# Patient Record
Sex: Female | Born: 1970
Health system: Southern US, Community
[De-identification: ages and names within clinical notes are randomized; demographics above are authoritative.]

## PROBLEM LIST (undated history)

## (undated) DIAGNOSIS — Z969 Presence of functional implant, unspecified: Secondary | ICD-10-CM

## (undated) DIAGNOSIS — S60811A Abrasion of right wrist, initial encounter: Secondary | ICD-10-CM

## (undated) DIAGNOSIS — J3489 Other specified disorders of nose and nasal sinuses: Secondary | ICD-10-CM

## (undated) DIAGNOSIS — N951 Menopausal and female climacteric states: Secondary | ICD-10-CM

## (undated) DIAGNOSIS — I10 Essential (primary) hypertension: Secondary | ICD-10-CM

## (undated) DIAGNOSIS — M1990 Unspecified osteoarthritis, unspecified site: Secondary | ICD-10-CM

## (undated) DIAGNOSIS — E039 Hypothyroidism, unspecified: Secondary | ICD-10-CM

## (undated) DIAGNOSIS — Z98811 Dental restoration status: Secondary | ICD-10-CM

## (undated) DIAGNOSIS — K219 Gastro-esophageal reflux disease without esophagitis: Secondary | ICD-10-CM

## (undated) DIAGNOSIS — R05 Cough: Secondary | ICD-10-CM

## (undated) HISTORY — PX: BREAST BIOPSY: SHX20

## (undated) HISTORY — DX: Gastro-esophageal reflux disease without esophagitis: K21.9

## (undated) HISTORY — PX: MULTIPLE TOOTH EXTRACTIONS: SHX2053

## (undated) HISTORY — DX: Morbid (severe) obesity due to excess calories: E66.01

---
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
## 2003-03-29 ENCOUNTER — Encounter: Payer: Self-pay | Admitting: Family Medicine

## 2004-02-01 ENCOUNTER — Encounter: Payer: Self-pay | Admitting: Family Medicine

## 2005-02-10 ENCOUNTER — Encounter: Payer: Self-pay | Admitting: Family Medicine

## 2005-02-12 ENCOUNTER — Encounter: Payer: Self-pay | Admitting: Family Medicine

## 2007-02-24 LAB — CONVERTED CEMR LAB: Pap Smear: NORMAL

## 2008-02-01 ENCOUNTER — Ambulatory Visit: Payer: Self-pay | Admitting: Family Medicine

## 2008-02-01 DIAGNOSIS — F3340 Major depressive disorder, recurrent, in remission, unspecified: Secondary | ICD-10-CM | POA: Insufficient documentation

## 2008-02-01 DIAGNOSIS — F325 Major depressive disorder, single episode, in full remission: Secondary | ICD-10-CM | POA: Insufficient documentation

## 2008-02-01 DIAGNOSIS — S93409A Sprain of unspecified ligament of unspecified ankle, initial encounter: Secondary | ICD-10-CM | POA: Insufficient documentation

## 2008-02-01 DIAGNOSIS — K219 Gastro-esophageal reflux disease without esophagitis: Secondary | ICD-10-CM | POA: Insufficient documentation

## 2008-02-01 DIAGNOSIS — J309 Allergic rhinitis, unspecified: Secondary | ICD-10-CM | POA: Insufficient documentation

## 2008-02-01 DIAGNOSIS — N6019 Diffuse cystic mastopathy of unspecified breast: Secondary | ICD-10-CM | POA: Insufficient documentation

## 2008-02-01 DIAGNOSIS — G43809 Other migraine, not intractable, without status migrainosus: Secondary | ICD-10-CM | POA: Insufficient documentation

## 2008-02-01 DIAGNOSIS — F331 Major depressive disorder, recurrent, moderate: Secondary | ICD-10-CM | POA: Insufficient documentation

## 2008-02-02 ENCOUNTER — Telehealth (INDEPENDENT_AMBULATORY_CARE_PROVIDER_SITE_OTHER): Payer: Self-pay | Admitting: *Deleted

## 2008-02-29 ENCOUNTER — Ambulatory Visit: Payer: Self-pay | Admitting: Family Medicine

## 2008-02-29 ENCOUNTER — Encounter: Payer: Self-pay | Admitting: Family Medicine

## 2008-02-29 ENCOUNTER — Other Ambulatory Visit: Admission: RE | Admit: 2008-02-29 | Discharge: 2008-02-29 | Payer: Self-pay | Admitting: Family Medicine

## 2008-02-29 LAB — HM PAP SMEAR

## 2008-03-02 ENCOUNTER — Encounter: Payer: Self-pay | Admitting: Family Medicine

## 2008-10-24 ENCOUNTER — Ambulatory Visit (HOSPITAL_COMMUNITY): Admission: RE | Admit: 2008-10-24 | Discharge: 2008-10-24 | Payer: Self-pay | Admitting: Obstetrics and Gynecology

## 2008-11-15 ENCOUNTER — Encounter (HOSPITAL_COMMUNITY): Payer: Self-pay | Admitting: Obstetrics and Gynecology

## 2008-11-15 ENCOUNTER — Ambulatory Visit (HOSPITAL_COMMUNITY): Admission: RE | Admit: 2008-11-15 | Discharge: 2008-11-15 | Payer: Self-pay | Admitting: Internal Medicine

## 2008-11-15 HISTORY — PX: DILATION AND EVACUATION: SHX1459

## 2009-09-13 ENCOUNTER — Ambulatory Visit (HOSPITAL_COMMUNITY): Admission: RE | Admit: 2009-09-13 | Discharge: 2009-09-13 | Payer: Self-pay | Admitting: Gynecology

## 2009-10-24 ENCOUNTER — Ambulatory Visit (HOSPITAL_COMMUNITY): Admission: RE | Admit: 2009-10-24 | Discharge: 2009-10-24 | Payer: Self-pay | Admitting: Obstetrics and Gynecology

## 2009-10-24 HISTORY — PX: HYSTEROSCOPY WITH D & C: SHX1775

## 2010-03-04 ENCOUNTER — Telehealth: Payer: Self-pay | Admitting: Family Medicine

## 2010-03-10 ENCOUNTER — Emergency Department (HOSPITAL_COMMUNITY)
Admission: EM | Admit: 2010-03-10 | Discharge: 2010-03-10 | Payer: Self-pay | Source: Home / Self Care | Admitting: Emergency Medicine

## 2010-03-27 NOTE — Progress Notes (Signed)
Summary: alprazolam   Phone Note Refill Request Call back at [phone] Message from:  Patient on March 04, 2010 9:01 AM  Refills Requested: Medication #1:  ALPRAZOLAM 0.5 MG TABS 1 tab by mouth x 1 15 minutes prior to flight Patient is leaving tomorrow for a vacation. She is scared of flyling and is asking if she could get a refill for this to help her on her flight. Uses [facility] outpatient pharmacy.   Initial call taken by: Melody Comas,  March 04, 2010 9:02 AM  Follow-up for Phone Call        Rx called to pharmacy Follow-up by: Benny Lennert CMA Duncan Dull),  March 04, 2010 9:25 AM    Prescriptions: ALPRAZOLAM 0.5 MG TABS (ALPRAZOLAM) 1 tab by mouth x 1 15 minutes prior to flight, may repeat x 1 if anxiety continues  #4 x 0   Entered and Authorized by:   Kerby Nora MD   Signed by:   Kerby Nora MD on 03/04/2010   Method used:   Telephoned to ...       [facility]* (retail)       [address]       [address] Shipping/mailing       [place], [place]  [address]       Ph: [phone]       Fax: [phone]   RxID:   [id number]

## 2010-04-02 ENCOUNTER — Encounter: Payer: Self-pay | Admitting: Family Medicine

## 2010-05-08 LAB — CBC
HCT: 37.2 % (ref 36.0–46.0)
Hemoglobin: 12.6 g/dL (ref 12.0–15.0)
MCH: 30.6 pg (ref 26.0–34.0)
MCHC: 33.8 g/dL (ref 30.0–36.0)
MCV: 90.4 fL (ref 78.0–100.0)
Platelets: 357 10*3/uL (ref 150–400)
WBC: 12.8 10*3/uL — ABNORMAL HIGH (ref 4.0–10.5)

## 2010-05-08 LAB — PREGNANCY, URINE: Preg Test, Ur: NEGATIVE

## 2010-05-15 ENCOUNTER — Telehealth: Payer: Self-pay | Admitting: *Deleted

## 2010-05-15 NOTE — Telephone Encounter (Signed)
Patient is coming in for labs tomorrow and is asking if she could have her thyroid checked. She says that she was recently diagnosed with hypothyroidism by her OBGYN. Please advise.

## 2010-05-15 NOTE — Telephone Encounter (Signed)
Sent to terri to add for tomorrow

## 2010-05-15 NOTE — Telephone Encounter (Signed)
Yes ...add TSH, free T3 and free T4 Dx 244.9

## 2010-05-15 NOTE — Telephone Encounter (Signed)
CAN U ADD THESE

## 2010-05-16 ENCOUNTER — Other Ambulatory Visit (INDEPENDENT_AMBULATORY_CARE_PROVIDER_SITE_OTHER): Payer: 59 | Admitting: Family Medicine

## 2010-05-16 DIAGNOSIS — Z0000 Encounter for general adult medical examination without abnormal findings: Secondary | ICD-10-CM

## 2010-05-16 LAB — TSH: TSH: 0.95 u[IU]/mL (ref 0.35–5.50)

## 2010-05-16 LAB — BASIC METABOLIC PANEL
CO2: 28 mEq/L (ref 19–32)
Calcium: 9.1 mg/dL (ref 8.4–10.5)
Glucose, Bld: 86 mg/dL (ref 70–99)
Potassium: 4.2 mEq/L (ref 3.5–5.1)
Sodium: 141 mEq/L (ref 135–145)

## 2010-05-16 LAB — HEPATIC FUNCTION PANEL
AST: 19 U/L (ref 0–37)
Albumin: 3.6 g/dL (ref 3.5–5.2)
Bilirubin, Direct: 0.1 mg/dL (ref 0.0–0.3)
Total Bilirubin: 0.8 mg/dL (ref 0.3–1.2)
Total Protein: 6.4 g/dL (ref 6.0–8.3)

## 2010-05-16 LAB — CBC WITH DIFFERENTIAL/PLATELET
Basophils Absolute: 0 10*3/uL (ref 0.0–0.1)
Eosinophils Absolute: 0 10*3/uL (ref 0.0–0.7)
Eosinophils Relative: 0 % (ref 0.0–5.0)
Lymphocytes Relative: 27.4 % (ref 12.0–46.0)
Lymphs Abs: 2.5 10*3/uL (ref 0.7–4.0)
Monocytes Relative: 5.3 % (ref 3.0–12.0)
Platelets: 306 10*3/uL (ref 150.0–400.0)
RDW: 14 % (ref 11.5–14.6)

## 2010-05-16 LAB — T4, FREE: Free T4: 0.82 ng/dL (ref 0.60–1.60)

## 2010-05-16 LAB — T3, FREE: T3, Free: 2.7 pg/mL (ref 2.3–4.2)

## 2010-05-20 ENCOUNTER — Ambulatory Visit (INDEPENDENT_AMBULATORY_CARE_PROVIDER_SITE_OTHER): Payer: 59 | Admitting: Family Medicine

## 2010-05-20 ENCOUNTER — Encounter: Payer: Self-pay | Admitting: Family Medicine

## 2010-05-20 ENCOUNTER — Other Ambulatory Visit (HOSPITAL_COMMUNITY)
Admission: RE | Admit: 2010-05-20 | Discharge: 2010-05-20 | Disposition: A | Discharge status: Home / Self Care | Payer: 59 | Source: Ambulatory Visit | Attending: Family Medicine | Admitting: Family Medicine

## 2010-05-20 DIAGNOSIS — Z1231 Encounter for screening mammogram for malignant neoplasm of breast: Secondary | ICD-10-CM

## 2010-05-20 DIAGNOSIS — E039 Hypothyroidism, unspecified: Secondary | ICD-10-CM | POA: Insufficient documentation

## 2010-05-20 DIAGNOSIS — Z0000 Encounter for general adult medical examination without abnormal findings: Secondary | ICD-10-CM

## 2010-05-20 DIAGNOSIS — Z01419 Encounter for gynecological examination (general) (routine) without abnormal findings: Secondary | ICD-10-CM | POA: Insufficient documentation

## 2010-05-20 DIAGNOSIS — Z1239 Encounter for other screening for malignant neoplasm of breast: Secondary | ICD-10-CM

## 2010-05-20 NOTE — Progress Notes (Signed)
Subjective:    Patient ID: Jamie Mckenzie, female    DOB: 03-05-1970, 40 y.o.   MRN: [id number]  HPI  The patient is here for annual wellness exam and preventative care.   Updates in last 2 years since seen last.   10/2008... Pregnancy and misscarriage.  Diagnosed with septated uterus. 10/2009 menorrhagia: uterine polyp removed.   Has gained a significant weight in last year... A lot of stress in past few years.  Minimal exercise... Poor diet. Grazes a lot. 2 big meals a day.  Dx with hypothyroid: started on levothyroxine in 2010.  Lab Results  Component Value Date   TSH 0.95 05/16/2010      Review of Systems  Constitutional: Negative for fever, fatigue and unexpected weight change.  HENT: Negative for ear pain, congestion, sore throat, sneezing, trouble swallowing, neck pain and sinus pressure.   Eyes: Negative for pain and itching.  Respiratory: Negative for cough, shortness of breath and wheezing.   Cardiovascular: Negative for chest pain, palpitations and leg swelling.  Gastrointestinal: Negative for nausea, abdominal pain, diarrhea, constipation and blood in stool.  Genitourinary: Negative for dysuria, hematuria, vaginal discharge, difficulty urinating and menstrual problem.  Skin: Negative for rash.  Neurological: Negative for syncope, weakness, light-headedness, numbness and headaches.  Psychiatric/Behavioral: Negative for confusion and dysphoric mood. The patient is not nervous/anxious.         Objective:   Physical Exam  Constitutional: Vital signs are normal. She appears well-developed and well-nourished. She is cooperative.  Non-toxic appearance. She does not appear ill. No distress.  HENT:  Head: Normocephalic.  Right Ear: Hearing, tympanic membrane, external ear and ear canal normal.  Left Ear: Hearing, tympanic membrane, external ear and ear canal normal.  Nose: Nose normal.  Eyes: Conjunctivae, EOM and lids are normal. Pupils are equal, round, and  reactive to light. No foreign bodies found.  Neck: Trachea normal and normal range of motion. Neck supple. Carotid bruit is not present. No mass and no thyromegaly present.  Cardiovascular: Normal rate, regular rhythm, S1 normal, S2 normal, normal heart sounds and intact distal pulses.  Exam reveals no gallop.   No murmur heard. Pulmonary/Chest: Effort normal and breath sounds normal. No respiratory distress. She has no wheezes. She has no rhonchi. She has no rales.  Abdominal: Soft. Normal appearance and bowel sounds are normal. She exhibits no distension, no fluid wave, no abdominal bruit and no mass. There is no hepatosplenomegaly. There is no tenderness. There is no rebound, no guarding and no CVA tenderness. No hernia.  Genitourinary: Rectum normal, vagina normal and uterus normal. No breast swelling, tenderness, discharge or bleeding. Pelvic exam was performed with patient prone. There is no rash, tenderness or lesion on the right labia. There is no rash, tenderness or lesion on the left labia. Uterus is not enlarged and not tender. Cervix exhibits no discharge and no friability. Right adnexum displays no mass, no tenderness and no fullness. Left adnexum displays no mass, no tenderness and no fullness.  Lymphadenopathy:    She has no cervical adenopathy.    She has no axillary adenopathy.  Neurological: She is alert. She has normal strength. No cranial nerve deficit or sensory deficit.  Skin: Skin is warm, dry and intact. No rash noted.  Psychiatric: Her speech is normal and behavior is normal. Judgment normal. Her mood appears not anxious. Cognition and memory are normal. She does not exhibit a depressed mood.          Assessment &  Plan:  Complete Physical Exam: The patient's preventative maintenance and recommended screening tests for an annual wellness exam were reviewed in full today. Brought up to date unless services declined.  Counselled on the importance of diet, exercise, and  its role in overall health and mortality. The patient's FH and SH was reviewed, including their home life, tobacco status, and drug and alcohol status.   Pap pending.  Schedule Mammogram.

## 2010-05-20 NOTE — Assessment & Plan Note (Signed)
Well controlled. Continue current medication.  

## 2010-05-20 NOTE — Patient Instructions (Addendum)
Stop by front desk to see Jamie Mckenzie about mammogram referral.  Work on healthy eating, portion size and regular exercise to lose weight.  Stop smoking .Marland Kitchen Call if interested in medication to quit.

## 2010-05-23 ENCOUNTER — Encounter: Payer: Self-pay | Admitting: *Deleted

## 2010-05-27 ENCOUNTER — Encounter: Payer: Self-pay | Admitting: *Deleted

## 2010-05-28 ENCOUNTER — Ambulatory Visit (HOSPITAL_COMMUNITY)
Admission: RE | Admit: 2010-05-28 | Discharge: 2010-05-28 | Disposition: A | Discharge status: Home / Self Care | Payer: 59 | Source: Ambulatory Visit | Attending: Family Medicine | Admitting: Family Medicine

## 2010-05-28 DIAGNOSIS — Z1231 Encounter for screening mammogram for malignant neoplasm of breast: Secondary | ICD-10-CM | POA: Insufficient documentation

## 2010-05-29 ENCOUNTER — Encounter: Payer: Self-pay | Admitting: *Deleted

## 2010-05-30 LAB — CBC
HCT: 40.6 % (ref 36.0–46.0)
Hemoglobin: 13.9 g/dL (ref 12.0–15.0)
RBC: 4.44 MIL/uL (ref 3.87–5.11)

## 2010-12-12 ENCOUNTER — Other Ambulatory Visit: Payer: Self-pay | Admitting: *Deleted

## 2010-12-12 MED ORDER — NORETHINDRONE 0.35 MG PO TABS: 1.0000 | ORAL_TABLET | Freq: Every day | ORAL | Status: DC

## 2010-12-12 MED ORDER — LEVOTHYROXINE SODIUM 25 MCG PO TABS: 25.0000 ug | ORAL_TABLET | Freq: Every day | ORAL | Status: DC

## 2011-04-21 ENCOUNTER — Telehealth: Payer: Self-pay | Admitting: *Deleted

## 2011-04-21 NOTE — Telephone Encounter (Signed)
Received a call from [facility] with Call A Nurse stating that patient has been experiencing food being caught in her throat over the past several days.  Bonita Quin stated that per call a nurse guidelines patient should be evaluated today.  Annabell Sabal to have patient go to the ER.  I called patient and advised her to go to ER.  She agreed.

## 2011-04-21 NOTE — Telephone Encounter (Signed)
Triage Record Num: [id number] Operator: Di Kindle Patient Name: Jamie Mckenzie Call Date & Time: 04/21/2011 2:24:18PM Patient Phone: [phone] PCP: Kerby Nora Patient Gender: Female PCP Fax : [phone] Patient DOB: 01-23-1971 Practice Name: [facility] Reason for Call: Caller: [name]/Patient; PCP: Excell Seltzer.; CB#: [phone]; Call regarding Indigestion; occurs frequently after eating over the last 2-3 weeks, occurs now with discomfort in her chest, like something is stuck. Symptoms reviewed Swallowing Difficulty, states has improved on its own before today. With symptoms, ED disposition, call to office nurse: Lowella Bandy who advises RN will call pt back after talking with MD. Protocol(s) Used: Heartburn Protocol(s) Used: Swallowing Difficulty Recommended Outcome per Protocol: See ED Immediately Reason for Outcome: Food or foreign body feels stuck in esophagus. Something seems to be stuck AND not choking Care Advice: ~ Another adult should drive. Call EMS 911 if sudden onset or sudden worsening of breathing problems, struggling to breathe, high pitched noise when breathing in (stridor), unable to speak, grasping at throat, or panic/anxiety because of breathing problems. ~ ~ Sit upright or raise head with pillows. 04/21/2011 3:16:43PM Page 1 of 1 [id number]

## 2011-05-14 NOTE — Telephone Encounter (Signed)
Reviewed

## 2011-06-15 ENCOUNTER — Other Ambulatory Visit: Payer: Self-pay | Admitting: Family Medicine

## 2011-06-15 DIAGNOSIS — Z1231 Encounter for screening mammogram for malignant neoplasm of breast: Secondary | ICD-10-CM

## 2011-07-08 ENCOUNTER — Ambulatory Visit (HOSPITAL_COMMUNITY): Payer: 59

## 2011-09-15 ENCOUNTER — Ambulatory Visit (HOSPITAL_COMMUNITY)
Admission: RE | Admit: 2011-09-15 | Discharge: 2011-09-15 | Disposition: A | Discharge status: Home / Self Care | Payer: 59 | Source: Ambulatory Visit | Attending: Family Medicine | Admitting: Family Medicine

## 2011-09-15 DIAGNOSIS — Z1231 Encounter for screening mammogram for malignant neoplasm of breast: Secondary | ICD-10-CM | POA: Insufficient documentation

## 2011-10-24 ENCOUNTER — Emergency Department (HOSPITAL_COMMUNITY)
Admission: EM | Admit: 2011-10-24 | Discharge: 2011-10-24 | Disposition: A | Discharge status: Home / Self Care | Payer: 59 | Attending: Emergency Medicine | Admitting: Emergency Medicine

## 2011-10-24 ENCOUNTER — Encounter (HOSPITAL_COMMUNITY): Payer: Self-pay | Admitting: Cardiology

## 2011-10-24 ENCOUNTER — Emergency Department (HOSPITAL_COMMUNITY): Payer: 59

## 2011-10-24 DIAGNOSIS — K219 Gastro-esophageal reflux disease without esophagitis: Secondary | ICD-10-CM | POA: Insufficient documentation

## 2011-10-24 DIAGNOSIS — S82839A Other fracture of upper and lower end of unspecified fibula, initial encounter for closed fracture: Secondary | ICD-10-CM

## 2011-10-24 DIAGNOSIS — X500XXA Overexertion from strenuous movement or load, initial encounter: Secondary | ICD-10-CM | POA: Insufficient documentation

## 2011-10-24 DIAGNOSIS — S82899A Other fracture of unspecified lower leg, initial encounter for closed fracture: Secondary | ICD-10-CM | POA: Insufficient documentation

## 2011-10-24 DIAGNOSIS — Y9229 Other specified public building as the place of occurrence of the external cause: Secondary | ICD-10-CM | POA: Insufficient documentation

## 2011-10-24 MED ORDER — MORPHINE SULFATE 4 MG/ML IJ SOLN
4.0000 mg | Freq: Once | INTRAMUSCULAR | Status: AC
  Administered 2011-10-24: 4 mg via INTRAVENOUS
  Filled 2011-10-24: qty 1

## 2011-10-24 MED ORDER — IBUPROFEN 800 MG PO TABS
800.0000 mg | ORAL_TABLET | Freq: Once | ORAL | Status: AC
  Administered 2011-10-24: 800 mg via ORAL
  Filled 2011-10-24: qty 1

## 2011-10-24 MED ORDER — OXYCODONE-ACETAMINOPHEN 5-325 MG PO TABS: 1.0000 | ORAL_TABLET | Freq: Four times a day (QID) | ORAL | Status: DC | PRN

## 2011-10-24 MED ORDER — ONDANSETRON HCL 4 MG/2ML IJ SOLN
4.0000 mg | Freq: Once | INTRAMUSCULAR | Status: AC
  Administered 2011-10-24: 4 mg via INTRAVENOUS
  Filled 2011-10-24: qty 2

## 2011-10-24 NOTE — ED Notes (Signed)
Pt waiting for ortho to place split to left foot.

## 2011-10-24 NOTE — ED Provider Notes (Signed)
History     CSN: [id number]  Arrival date & time 10/24/11  1021   First MD Initiated Contact with Patient 10/24/11 1022      No chief complaint on file.   (Consider location/radiation/quality/duration/timing/severity/associated sxs/prior treatment) Patient is a 41 y.o. female presenting with ankle pain. The history is provided by the patient.  Ankle Pain  The incident occurred less than 1 hour ago. Incident location: at bed, bath, and beyond. Injury mechanism: stepped off curb and rolled ankle. The pain is present in the left ankle. The quality of the pain is described as sharp. The pain is severe. The pain has been constant since onset. Associated symptoms include inability to bear weight. Pertinent negatives include no numbness and no tingling. She reports no foreign bodies present. The symptoms are aggravated by activity, bearing weight and palpation. She has tried nothing for the symptoms.    Past Medical History  Diagnosis Date  . Allergy   . GERD (gastroesophageal reflux disease)   . Depression   . Ocular migraine   . Complete miscarriage 2010  . Uterine polyp 2010    Past Surgical History  Procedure Date  . Ankle fracture repair 1985    Hardware in fibula  . Dilation and curettage of uterus 2010, 2011    Family History  Problem Relation Age of Onset  . Hypertension Mother   . Arthritis Mother   . Obesity Mother     morbid  . Myasthenia gravis Mother   . Cancer Father     non hodgkins lymphoma  . Cancer Maternal Grandmother     lung  . Heart disease Maternal Grandfather     heart attack  . COPD Paternal Grandfather     History  Substance Use Topics  . Smoking status: Current Some Day Smoker -- 2 years    Types: Cigarettes  . Smokeless tobacco: Not on file  . Alcohol Use: Yes    OB History    Grav Para Term Preterm Abortions TAB SAB Ect Mult Living                  Review of Systems  Neurological: Negative for tingling and numbness.  All other  systems reviewed and are negative.    Allergies  Review of patient's allergies indicates no known allergies.  Home Medications   Current Outpatient Rx  Name Route Sig Dispense Refill  . FEXOFENADINE HCL 180 MG PO TABS Oral Take 180 mg by mouth as needed.      Marland Kitchen LEVOTHYROXINE SODIUM 25 MCG PO TABS Oral Take 1 tablet (25 mcg total) by mouth daily. 90 tablet 2  . NORETHINDRONE 0.35 MG PO TABS Oral Take 1 tablet (0.35 mg total) by mouth daily. 1 Package 6  . OMEPRAZOLE 20 MG PO CPDR Oral Take 20 mg by mouth daily.      Marland Kitchen PRENATAL 19 PO Oral Take 1 tablet by mouth daily.        BP 129/89  Pulse 99  Resp 20  SpO2 98%  Physical Exam  Nursing note and vitals reviewed. Constitutional: She is oriented to person, place, and time. She appears well-developed and well-nourished. No distress.  HENT:  Head: Normocephalic and atraumatic.  Neck: Normal range of motion. Neck supple.  Musculoskeletal:       The left ankle has swelling near the lateral malleolus.  There is no proximal fibular ttp or 5th metatarsal ttp.    Neurological: She is alert and oriented to person,  place, and time.  Skin: Skin is warm and dry. She is not diaphoretic.    ED Course  Procedures (including critical care time)  Labs Reviewed - No data to display No results found.   No diagnosis found.    MDM  The xrays reveal a distal fibular fracture with separation of the mortoise between the talus and medial malleolus.  I have spoken with Dr. Charlann Boxer who will believes this to be a surgical issue.  The patient is due to go on a vacation to the beach and leave today.  Dr. Charlann Boxer believes that this trip will not worsen her outcome and that she can go so long as she sees ortho the following Monday.  She will be discharged to home once place in a posterior splint with a U wrap.  She was advised to rest, elevate, and ice for the next several days.        Geoffery Lyons, MD 10/24/11 [phone]

## 2011-10-24 NOTE — ED Notes (Signed)
Ortho at the bedside.

## 2011-10-24 NOTE — ED Notes (Signed)
Pt to department after falling at a store. Reports she stepped off a curb and twisted her left ankle.

## 2011-10-30 ENCOUNTER — Encounter (HOSPITAL_BASED_OUTPATIENT_CLINIC_OR_DEPARTMENT_OTHER): Payer: Self-pay | Admitting: *Deleted

## 2011-10-30 NOTE — Progress Notes (Signed)
Pt RN Donalds Denies any cardiac or resp problems or sleep apnea. No labs needed Was told she would stay overnight-bring all meds and overnight bag

## 2011-11-03 ENCOUNTER — Encounter (HOSPITAL_BASED_OUTPATIENT_CLINIC_OR_DEPARTMENT_OTHER)
Admission: RE | Disposition: A | Discharge status: Home / Self Care | Payer: Self-pay | Source: Ambulatory Visit | Attending: Orthopedic Surgery

## 2011-11-03 ENCOUNTER — Encounter (HOSPITAL_BASED_OUTPATIENT_CLINIC_OR_DEPARTMENT_OTHER): Payer: Self-pay

## 2011-11-03 ENCOUNTER — Ambulatory Visit (HOSPITAL_COMMUNITY): Payer: 59

## 2011-11-03 ENCOUNTER — Encounter (HOSPITAL_BASED_OUTPATIENT_CLINIC_OR_DEPARTMENT_OTHER): Payer: Self-pay | Admitting: Anesthesiology

## 2011-11-03 ENCOUNTER — Encounter (HOSPITAL_BASED_OUTPATIENT_CLINIC_OR_DEPARTMENT_OTHER): Payer: Self-pay | Admitting: *Deleted

## 2011-11-03 ENCOUNTER — Ambulatory Visit (HOSPITAL_BASED_OUTPATIENT_CLINIC_OR_DEPARTMENT_OTHER)
Admission: RE | Admit: 2011-11-03 | Discharge: 2011-11-04 | Disposition: A | Discharge status: Home / Self Care | Payer: 59 | Source: Ambulatory Visit | Attending: Orthopedic Surgery | Admitting: Orthopedic Surgery

## 2011-11-03 ENCOUNTER — Ambulatory Visit (HOSPITAL_BASED_OUTPATIENT_CLINIC_OR_DEPARTMENT_OTHER): Payer: 59 | Admitting: Anesthesiology

## 2011-11-03 DIAGNOSIS — F329 Major depressive disorder, single episode, unspecified: Secondary | ICD-10-CM | POA: Insufficient documentation

## 2011-11-03 DIAGNOSIS — S93439A Sprain of tibiofibular ligament of unspecified ankle, initial encounter: Secondary | ICD-10-CM | POA: Insufficient documentation

## 2011-11-03 DIAGNOSIS — S82843A Displaced bimalleolar fracture of unspecified lower leg, initial encounter for closed fracture: Secondary | ICD-10-CM | POA: Insufficient documentation

## 2011-11-03 DIAGNOSIS — R51 Headache: Secondary | ICD-10-CM | POA: Insufficient documentation

## 2011-11-03 DIAGNOSIS — S82842A Displaced bimalleolar fracture of left lower leg, initial encounter for closed fracture: Secondary | ICD-10-CM

## 2011-11-03 DIAGNOSIS — F3289 Other specified depressive episodes: Secondary | ICD-10-CM | POA: Insufficient documentation

## 2011-11-03 DIAGNOSIS — K219 Gastro-esophageal reflux disease without esophagitis: Secondary | ICD-10-CM | POA: Insufficient documentation

## 2011-11-03 DIAGNOSIS — W101XXA Fall (on)(from) sidewalk curb, initial encounter: Secondary | ICD-10-CM | POA: Insufficient documentation

## 2011-11-03 DIAGNOSIS — E039 Hypothyroidism, unspecified: Secondary | ICD-10-CM | POA: Insufficient documentation

## 2011-11-03 HISTORY — PX: ORIF ANKLE FRACTURE: SHX5408

## 2011-11-03 SURGERY — OPEN REDUCTION INTERNAL FIXATION (ORIF) ANKLE FRACTURE
Anesthesia: General | Site: Ankle | Laterality: Left | Wound class: Clean

## 2011-11-03 MED ORDER — CEFAZOLIN SODIUM-DEXTROSE 2-3 GM-% IV SOLR
2.0000 g | Freq: Four times a day (QID) | INTRAVENOUS | Status: AC
  Administered 2011-11-03 (×2): 2 g via INTRAVENOUS

## 2011-11-03 MED ORDER — OXYCODONE HCL 5 MG PO TABS: 5.0000 mg | ORAL_TABLET | ORAL | Status: AC | PRN

## 2011-11-03 MED ORDER — ONDANSETRON HCL 4 MG/2ML IJ SOLN
INTRAMUSCULAR | Status: DC | PRN
  Administered 2011-11-03: 4 mg via INTRAVENOUS

## 2011-11-03 MED ORDER — METOCLOPRAMIDE HCL 5 MG PO TABS: 5.0000 mg | ORAL_TABLET | Freq: Three times a day (TID) | ORAL | Status: DC | PRN

## 2011-11-03 MED ORDER — OXYCODONE HCL 5 MG/5ML PO SOLN: 5.0000 mg | Freq: Once | ORAL | Status: AC | PRN

## 2011-11-03 MED ORDER — ROPIVACAINE HCL 5 MG/ML IJ SOLN
INTRAMUSCULAR | Status: DC | PRN
  Administered 2011-11-03: 25 mL via EPIDURAL

## 2011-11-03 MED ORDER — SENNOSIDES 8.6 MG PO TABS: 2.0000 | ORAL_TABLET | Freq: Every day | ORAL | Status: DC

## 2011-11-03 MED ORDER — METOCLOPRAMIDE HCL 5 MG/ML IJ SOLN
INTRAMUSCULAR | Status: DC | PRN
  Administered 2011-11-03: 10 mg via INTRAVENOUS

## 2011-11-03 MED ORDER — HYDROMORPHONE HCL PF 1 MG/ML IJ SOLN
0.5000 mg | INTRAMUSCULAR | Status: DC | PRN
  Administered 2011-11-04 (×2): 0.5 mg via INTRAVENOUS

## 2011-11-03 MED ORDER — DOCUSATE SODIUM 100 MG PO CAPS: 100.0000 mg | ORAL_CAPSULE | Freq: Two times a day (BID) | ORAL | Status: AC

## 2011-11-03 MED ORDER — MIDAZOLAM HCL 2 MG/2ML IJ SOLN
1.0000 mg | INTRAMUSCULAR | Status: DC | PRN
  Administered 2011-11-03: 2 mg via INTRAVENOUS

## 2011-11-03 MED ORDER — CEFAZOLIN SODIUM-DEXTROSE 2-3 GM-% IV SOLR
2.0000 g | INTRAVENOUS | Status: AC
  Administered 2011-11-03: 2 g via INTRAVENOUS

## 2011-11-03 MED ORDER — BACITRACIN ZINC 500 UNIT/GM EX OINT
TOPICAL_OINTMENT | CUTANEOUS | Status: DC | PRN
  Administered 2011-11-03: 1 via TOPICAL

## 2011-11-03 MED ORDER — SODIUM CHLORIDE 0.9 % IV SOLN: INTRAVENOUS | Status: DC

## 2011-11-03 MED ORDER — OXYCODONE HCL 5 MG PO TABS
5.0000 mg | ORAL_TABLET | ORAL | Status: DC | PRN
  Administered 2011-11-03 – 2011-11-04 (×3): 10 mg via ORAL

## 2011-11-03 MED ORDER — OXYCODONE HCL 5 MG PO TABS: 5.0000 mg | ORAL_TABLET | Freq: Once | ORAL | Status: AC | PRN

## 2011-11-03 MED ORDER — ZOLPIDEM TARTRATE 5 MG PO TABS: 5.0000 mg | ORAL_TABLET | Freq: Every evening | ORAL | Status: DC | PRN

## 2011-11-03 MED ORDER — PROPOFOL 10 MG/ML IV BOLUS
INTRAVENOUS | Status: DC | PRN
  Administered 2011-11-03: 300 mg via INTRAVENOUS

## 2011-11-03 MED ORDER — FENTANYL CITRATE 0.05 MG/ML IJ SOLN
50.0000 ug | INTRAMUSCULAR | Status: DC | PRN
  Administered 2011-11-03: 100 ug via INTRAVENOUS

## 2011-11-03 MED ORDER — LIDOCAINE-EPINEPHRINE (PF) 1.5 %-1:200000 IJ SOLN
INTRAMUSCULAR | Status: DC | PRN
  Administered 2011-11-03: 25 mL

## 2011-11-03 MED ORDER — DOCUSATE SODIUM 100 MG PO CAPS: 100.0000 mg | ORAL_CAPSULE | Freq: Two times a day (BID) | ORAL | Status: DC

## 2011-11-03 MED ORDER — SODIUM CHLORIDE 0.9 % IV SOLN
INTRAVENOUS | Status: DC
  Administered 2011-11-03: 14:00:00 via INTRAVENOUS

## 2011-11-03 MED ORDER — METOCLOPRAMIDE HCL 5 MG/ML IJ SOLN: 10.0000 mg | Freq: Once | INTRAMUSCULAR | Status: AC | PRN

## 2011-11-03 MED ORDER — DEXAMETHASONE SODIUM PHOSPHATE 10 MG/ML IJ SOLN
INTRAMUSCULAR | Status: DC | PRN
  Administered 2011-11-03: 10 mg via INTRAVENOUS

## 2011-11-03 MED ORDER — LIDOCAINE HCL (CARDIAC) 20 MG/ML IV SOLN
INTRAVENOUS | Status: DC | PRN
  Administered 2011-11-03: 50 mg via INTRAVENOUS

## 2011-11-03 MED ORDER — DIPHENHYDRAMINE HCL 12.5 MG/5ML PO ELIX: 12.5000 mg | ORAL_SOLUTION | ORAL | Status: DC | PRN

## 2011-11-03 MED ORDER — CHLORHEXIDINE GLUCONATE 4 % EX LIQD: 60.0000 mL | Freq: Once | CUTANEOUS | Status: DC

## 2011-11-03 MED ORDER — SENNA 8.6 MG PO TABS: 1.0000 | ORAL_TABLET | Freq: Two times a day (BID) | ORAL | Status: DC

## 2011-11-03 MED ORDER — LACTATED RINGERS IV SOLN
INTRAVENOUS | Status: DC
  Administered 2011-11-03 (×2): via INTRAVENOUS

## 2011-11-03 MED ORDER — METOCLOPRAMIDE HCL 5 MG/ML IJ SOLN: 5.0000 mg | Freq: Three times a day (TID) | INTRAMUSCULAR | Status: DC | PRN

## 2011-11-03 MED ORDER — LIDOCAINE HCL 1 % IJ SOLN
INTRAMUSCULAR | Status: DC | PRN
  Administered 2011-11-03: 2 mL via INTRADERMAL

## 2011-11-03 MED ORDER — PANTOPRAZOLE SODIUM 40 MG PO TBEC: 40.0000 mg | DELAYED_RELEASE_TABLET | Freq: Every day | ORAL | Status: DC

## 2011-11-03 MED ORDER — ONDANSETRON HCL 4 MG PO TABS: 4.0000 mg | ORAL_TABLET | Freq: Four times a day (QID) | ORAL | Status: DC | PRN

## 2011-11-03 MED ORDER — LEVOTHYROXINE SODIUM 25 MCG PO TABS: 25.0000 ug | ORAL_TABLET | Freq: Every day | ORAL | Status: DC

## 2011-11-03 MED ORDER — HYDROMORPHONE HCL PF 1 MG/ML IJ SOLN: 0.2500 mg | INTRAMUSCULAR | Status: DC | PRN

## 2011-11-03 MED ORDER — RIVAROXABAN 10 MG PO TABS: 10.0000 mg | ORAL_TABLET | Freq: Every day | ORAL | Status: DC

## 2011-11-03 MED ORDER — ONDANSETRON HCL 4 MG/2ML IJ SOLN: 4.0000 mg | Freq: Four times a day (QID) | INTRAMUSCULAR | Status: DC | PRN

## 2011-11-03 SURGICAL SUPPLY — 71 items
BAG DECANTER FOR FLEXI CONT (MISCELLANEOUS) IMPLANT
BANDAGE ESMARK 6X9 LF (GAUZE/BANDAGES/DRESSINGS) ×1 IMPLANT
BIT DRILL 2.5X2.75 QC CALB (BIT) ×2 IMPLANT
BIT DRILL 3.5X5.5 QC CALB (BIT) ×2 IMPLANT
BLADE SURG 15 STRL LF DISP TIS (BLADE) ×2 IMPLANT
BLADE SURG 15 STRL SS (BLADE) ×2
BNDG COHESIVE 4X5 TAN STRL (GAUZE/BANDAGES/DRESSINGS) ×2 IMPLANT
BNDG COHESIVE 6X5 TAN STRL LF (GAUZE/BANDAGES/DRESSINGS) ×2 IMPLANT
BNDG ESMARK 6X9 LF (GAUZE/BANDAGES/DRESSINGS) ×2
CHLORAPREP W/TINT 26ML (MISCELLANEOUS) ×2 IMPLANT
CLOTH BEACON ORANGE TIMEOUT ST (SAFETY) ×2 IMPLANT
COVER TABLE BACK 60X90 (DRAPES) ×2 IMPLANT
CUFF TOURNIQUET SINGLE 34IN LL (TOURNIQUET CUFF) IMPLANT
CUFF TOURNIQUET SINGLE 44IN (TOURNIQUET CUFF) ×2 IMPLANT
DECANTER SPIKE VIAL GLASS SM (MISCELLANEOUS) IMPLANT
DRAPE C-ARM 42X72 X-RAY (DRAPES) ×2 IMPLANT
DRAPE EXTREMITY T 121X128X90 (DRAPE) ×2 IMPLANT
DRAPE INCISE IOBAN 66X45 STRL (DRAPES) ×2 IMPLANT
DRAPE U-SHAPE 47X51 STRL (DRAPES) ×2 IMPLANT
DRAPE U-SHAPE 76X120 STRL (DRAPES) ×2 IMPLANT
DRESSING ADAPTIC 1/2  N-ADH (PACKING) IMPLANT
DRSG EMULSION OIL 3X3 NADH (GAUZE/BANDAGES/DRESSINGS) ×2 IMPLANT
DRSG PAD ABDOMINAL 8X10 ST (GAUZE/BANDAGES/DRESSINGS) ×4 IMPLANT
ELECT REM PT RETURN 9FT ADLT (ELECTROSURGICAL) ×2
ELECTRODE REM PT RTRN 9FT ADLT (ELECTROSURGICAL) ×1 IMPLANT
GLOVE BIO SURGEON STRL SZ8 (GLOVE) ×2 IMPLANT
GLOVE BIOGEL PI IND STRL 8 (GLOVE) ×1 IMPLANT
GLOVE BIOGEL PI INDICATOR 8 (GLOVE) ×1
GLOVE ECLIPSE 6.5 STRL STRAW (GLOVE) ×2 IMPLANT
GOWN PREVENTION PLUS XLARGE (GOWN DISPOSABLE) ×2 IMPLANT
GOWN PREVENTION PLUS XXLARGE (GOWN DISPOSABLE) ×2 IMPLANT
NEEDLE HYPO 22GX1.5 SAFETY (NEEDLE) IMPLANT
PACK BASIN DAY SURGERY FS (CUSTOM PROCEDURE TRAY) ×2 IMPLANT
PAD CAST 4YDX4 CTTN HI CHSV (CAST SUPPLIES) ×2 IMPLANT
PADDING CAST COTTON 4X4 STRL (CAST SUPPLIES) ×2
PADDING CAST COTTON 6X4 STRL (CAST SUPPLIES) ×2 IMPLANT
PENCIL BUTTON HOLSTER BLD 10FT (ELECTRODE) ×2 IMPLANT
PLATE ACE 100DEG 8HOLE (Plate) ×2 IMPLANT
SCREW CORTICAL 3.5MM  16MM (Screw) ×1 IMPLANT
SCREW CORTICAL 3.5MM  20MM (Screw) ×1 IMPLANT
SCREW CORTICAL 3.5MM  55MM (Screw) ×1 IMPLANT
SCREW CORTICAL 3.5MM  60MM (Screw) ×1 IMPLANT
SCREW CORTICAL 3.5MM 14MM (Screw) ×6 IMPLANT
SCREW CORTICAL 3.5MM 16MM (Screw) ×1 IMPLANT
SCREW CORTICAL 3.5MM 18MM (Screw) ×2 IMPLANT
SCREW CORTICAL 3.5MM 20MM (Screw) ×1 IMPLANT
SCREW CORTICAL 3.5MM 55MM (Screw) ×1 IMPLANT
SCREW CORTICAL 3.5MM 60MM (Screw) ×1 IMPLANT
SHEET MEDIUM DRAPE 40X70 STRL (DRAPES) ×2 IMPLANT
SLEEVE SCD COMPRESS KNEE MED (MISCELLANEOUS) ×2 IMPLANT
SPLINT FAST PLASTER 5X30 (CAST SUPPLIES) ×20
SPLINT PLASTER CAST FAST 5X30 (CAST SUPPLIES) ×20 IMPLANT
SPONGE GAUZE 4X4 12PLY (GAUZE/BANDAGES/DRESSINGS) ×2 IMPLANT
SPONGE LAP 18X18 X RAY DECT (DISPOSABLE) ×2 IMPLANT
STAPLER VISISTAT 35W (STAPLE) IMPLANT
STOCKINETTE 6  STRL (DRAPES) ×1
STOCKINETTE 6 STRL (DRAPES) ×1 IMPLANT
STRIP CLOSURE SKIN 1/2X4 (GAUZE/BANDAGES/DRESSINGS) IMPLANT
SUCTION FRAZIER TIP 10 FR DISP (SUCTIONS) ×2 IMPLANT
SUT MNCRL AB 3-0 PS2 18 (SUTURE) ×2 IMPLANT
SUT PROLENE 3 0 PS 2 (SUTURE) ×2 IMPLANT
SUT VIC AB 0 SH 27 (SUTURE) IMPLANT
SUT VIC AB 2-0 SH 27 (SUTURE) ×1
SUT VIC AB 2-0 SH 27XBRD (SUTURE) ×1 IMPLANT
SUT VICRYL 4-0 PS2 18IN ABS (SUTURE) IMPLANT
SYR BULB 3OZ (MISCELLANEOUS) ×2 IMPLANT
SYR CONTROL 10ML LL (SYRINGE) IMPLANT
TUBE CONNECTING 12X1/4 (SUCTIONS) ×2 IMPLANT
TUBE CONNECTING 20X1/4 (TUBING) ×2 IMPLANT
UNDERPAD 30X30 INCONTINENT (UNDERPADS AND DIAPERS) ×2 IMPLANT
WATER STERILE IRR 1000ML POUR (IV SOLUTION) IMPLANT

## 2011-11-03 NOTE — Anesthesia Preprocedure Evaluation (Signed)
Anesthesia Evaluation  Patient identified by MRN, date of birth, ID band Patient awake    Reviewed: Allergy & Precautions, H&P , NPO status , Patient's Chart, lab work & pertinent test results, reviewed documented beta blocker date and time   Airway Mallampati: II TM Distance: >3 FB Neck ROM: full    Dental   Pulmonary neg pulmonary ROS,  breath sounds clear to auscultation        Cardiovascular negative cardio ROS  Rhythm:regular     Neuro/Psych  Headaches, PSYCHIATRIC DISORDERS Depression    GI/Hepatic Neg liver ROS, GERD-  Medicated and Controlled,  Endo/Other  Hypothyroidism Morbid obesity  Renal/GU negative Renal ROS  negative genitourinary   Musculoskeletal   Abdominal   Peds  Hematology negative hematology ROS (+)   Anesthesia Other Findings See surgeon's H&P   Reproductive/Obstetrics negative OB ROS                           Anesthesia Physical Anesthesia Plan  ASA: III  Anesthesia Plan: General   Post-op Pain Management:    Induction:   Airway Management Planned: LMA  Additional Equipment:   Intra-op Plan:   Post-operative Plan: Extubation in OR  Informed Consent: I have reviewed the patients History and Physical, chart, labs and discussed the procedure including the risks, benefits and alternatives for the proposed anesthesia with the patient or authorized representative who has indicated his/her understanding and acceptance.   Dental Advisory Given  Plan Discussed with: CRNA and Surgeon  Anesthesia Plan Comments:         Anesthesia Quick Evaluation

## 2011-11-03 NOTE — Brief Op Note (Signed)
11/03/2011  12:20 PM  PATIENT:  Florence Canner Crespi  41 y.o. female  PRE-OPERATIVE DIAGNOSIS:  Left ankle bimalleolar fracture and syndesmosis disruption  POST-OPERATIVE DIAGNOSIS:  Left ankle bimalleolar fracture and syndesmosis disruption  Procedure(s): 1. OPEN REDUCTION INTERNAL FIXATION (ORIF) left ANKLE bimalleolar FRACTURE 2.  ORIF left ankle syndesmosis 3.  Fluoro 4.  Stress exam of left ankle under fluoro  SURGEON:  Toni Arthurs, MD  ASSISTANT: n/a  ANESTHESIA:   General, regional  EBL:  minimal   TOURNIQUET:  49 min at 350 mm Hg  COMPLICATIONS:  None apparent  DISPOSITION:  Extubated, awake and stable to recovery.  DICTATION ID:  [id number]

## 2011-11-03 NOTE — Transfer of Care (Signed)
Immediate Anesthesia Transfer of Care Note  Patient: Jamie Mckenzie  Procedure(s) Performed: Procedure(s) (LRB) with comments: OPEN REDUCTION INTERNAL FIXATION (ORIF) ANKLE FRACTURE (Left) - ORIF left ankle lateral malleolus fracture, left syndesmosis, possible repair deltoid ligament  Patient Location: PACU  Anesthesia Type: GA combined with regional for post-op pain  Level of Consciousness: awake and patient cooperative  Airway & Oxygen Therapy: Patient Spontanous Breathing and Patient connected to face mask oxygen  Post-op Assessment: Report given to PACU RN, Post -op Vital signs reviewed and stable and Patient moving all extremities  Post vital signs: Reviewed and stable  Complications: No apparent anesthesia complications

## 2011-11-03 NOTE — Anesthesia Postprocedure Evaluation (Signed)
Anesthesia Post Note  Patient: Jamie Mckenzie  Procedure(s) Performed: Procedure(s) (LRB): OPEN REDUCTION INTERNAL FIXATION (ORIF) ANKLE FRACTURE (Left)  Anesthesia type: General  Patient location: PACU  Post pain: Pain level controlled  Post assessment: Patient's Cardiovascular Status Stable  Last Vitals:  Filed Vitals:   11/03/11 1345  BP: 163/80  Pulse: 70  Temp: 36.9 C  Resp: 16    Post vital signs: Reviewed and stable  Level of consciousness: alert  Complications: No apparent anesthesia complications

## 2011-11-03 NOTE — Anesthesia Procedure Notes (Addendum)
Anesthesia Regional Block:  Popliteal block  Pre-Anesthetic Checklist: ,, timeout performed, Correct Patient, Correct Site, Correct Laterality, Correct Procedure, Correct Position, site marked, Risks and benefits discussed,  Surgical consent,  Pre-op evaluation,  At surgeon's request and post-op pain management  Laterality: Left  Prep: chloraprep       Needles:   Needle Type: Other   (Arrow Echogenic)   Needle Length: 9cm  Needle Gauge: 21    Additional Needles:  Procedures: ultrasound guided Popliteal block Narrative:  Start time: 11/03/2011 10:06 AM End time: 11/03/2011 10:15 AM Injection made incrementally with aspirations every 5 mL.  Performed by: Personally  Anesthesiologist: Aldona Lento, MD  Additional Notes: Ultrasound guidance used to: id relevant anatomy, confirm needle position, local anesthetic spread, avoidance of vascular puncture. Picture saved. No complications. Block performed personally by Janetta Hora. Gelene Mink, MD  .    Popliteal block Procedure Name: LMA Insertion Date/Time: 11/03/2011 11:14 AM Performed by: Meyer Russel Pre-anesthesia Checklist: Patient identified, Emergency Drugs available, Suction available and Patient being monitored Patient Re-evaluated:Patient Re-evaluated prior to inductionOxygen Delivery Method: Circle System Utilized Preoxygenation: Pre-oxygenation with 100% oxygen Intubation Type: IV induction Ventilation: Mask ventilation without difficulty LMA: LMA inserted LMA Size: 4.0 Number of attempts: 1 Airway Equipment and Method: bite block Placement Confirmation: positive ETCO2 and breath sounds checked- equal and bilateral Tube secured with: Tape Dental Injury: Teeth and Oropharynx as per pre-operative assessment

## 2011-11-03 NOTE — H&P (Signed)
Jamie Mckenzie is an 41 y.o. female.   Chief Complaint: left ankle injury HPI: 41 y/o female with left ankle syndesmosis disruption and bimalleolar ankle fracture after a fall a week ago.  Past Medical History  Diagnosis Date  . Allergy   . GERD (gastroesophageal reflux disease)   . Depression   . Ocular migraine   . Complete miscarriage 2010  . Uterine polyp 2010    Past Surgical History  Procedure Date  . Ankle fracture repair 1985    Hardware in fibula  . Dilation and curettage of uterus 2010, 2011    Family History  Problem Relation Age of Onset  . Hypertension Mother   . Arthritis Mother   . Obesity Mother     morbid  . Myasthenia gravis Mother   . Cancer Father     non hodgkins lymphoma  . Cancer Maternal Grandmother     lung  . Heart disease Maternal Grandfather     heart attack  . COPD Paternal Grandfather    Social History:  reports that she has been smoking Cigarettes.  She has a .5 pack-year smoking history. She does not have any smokeless tobacco history on file. She reports that she drinks alcohol. She reports that she does not use illicit drugs.  Allergies: No Known Allergies  Medications Prior to Admission  Medication Sig Dispense Refill  . levothyroxine (SYNTHROID, LEVOTHROID) 25 MCG tablet Take 25 mcg by mouth daily.      Marland Kitchen omeprazole (PRILOSEC) 20 MG capsule Take 20 mg by mouth daily.        Marland Kitchen oxyCODONE-acetaminophen (PERCOCET/ROXICET) 5-325 MG per tablet Take 1-2 tablets by mouth every 6 (six) hours as needed for pain.  40 tablet  0    No results found for this or any previous visit (from the past 48 hour(s)). No results found.  ROS  No recent f/c/n/v/wt loss  Blood pressure 115/57, pulse 71, temperature 98 F (36.7 C), temperature source Oral, resp. rate 13, height 5\' 5"  (1.651 m), weight 125.193 kg (276 lb), last menstrual period 08/26/2011, SpO2 100.00%. Physical Exam  wn wd woman in nad.  A and O x 4.  Mood and affect normal.  EOMI.   Respirations unlabored.  L LE with swelling and ecchymosis.  No blisters.  Skin wrinkles.  Palpable pulses.  Feels LT normally.  5/5 strength in pf and df of toes. Assessment/Plan L ankle bimal fracture with syndesmosis disruption - to OR for ORIF of ankle fracture and syndesmosis.  The risks and benefits of the alternative treatment options have been discussed in detail.  The patient wishes to proceed with surgery and specifically understands risks of bleeding, infection, nerve damage, blood clots, need for additional surgery, amputation and death.   Toni Arthurs 2011-11-16, 10:53 AM

## 2011-11-04 ENCOUNTER — Encounter (HOSPITAL_BASED_OUTPATIENT_CLINIC_OR_DEPARTMENT_OTHER): Payer: Self-pay | Admitting: Orthopedic Surgery

## 2011-11-04 NOTE — Op Note (Signed)
NAMEGLENNETTE, Jamie Mckenzie            ACCOUNT NO.:  [id number]  MEDICAL RECORD NO.:  [id number]  LOCATION:                                 FACILITY:  PHYSICIAN:  Toni Arthurs, MD        DATE OF BIRTH:  1970/04/11  DATE OF PROCEDURE:  11/03/2011 DATE OF DISCHARGE:                              OPERATIVE REPORT   PREOPERATIVE DIAGNOSIS: 1. Left ankle bimalleolar fracture (lateral malleolus and posterior     malleolus). 2. Left ankle syndesmosis disruption.  POSTOPERATIVE DIAGNOSES: 1. Left ankle bimalleolar fracture (lateral malleolus and posterior     malleolus). 2. Left ankle syndesmosis disruption.  PROCEDURE: 1. Open reduction and internal fixation of left ankle bimalleolar     fracture. 2. Open reduction and internal fixation of left ankle syndesmosis     disruption. 3. Intraoperative interpretation of fluoroscopic imaging. 4. Stress examination of left ankle under fluoroscopy.  SURGEON:  Toni Arthurs, MD  ANESTHESIA:  General, regional.  ESTIMATED BLOOD LOSS:  Minimal.  TOURNIQUET TIME:  49 minutes at 350 mmHg.  COMPLICATIONS:  None apparent.  DISPOSITION:  Extubated, awake, and stable to recovery.  INDICATION FOR PROCEDURE:  The patient is a 41 year old female with past medical history significant for cigarette smoking.  She slipped on a curb approximately 10 days ago, twisting her left ankle.  She was seen in the emergency department where x-rays revealed a Weber C fracture of the lateral malleolus with disruption of the syndesmosis and a posterior malleolus fracture.  She presents now for operative treatment of these unstable displaced injury.  She understands the risks and benefits, the alternative treatment options, and elects surgical treatment.  She specifically understands risks of bleeding, infection, nerve damage, blood clots, need for additional surgery, amputation, and death.  PROCEDURE IN DETAIL:  After preoperative consent was obtained and the correct  operative site was identified, the patient was brought to the operating room and placed supine on the operating table.  General anesthesia was induced.  Preoperative antibiotics were administered. Surgical time-out was taken.  Left lower extremity was prepped and draped in standard sterile fashion.  The tourniquet around the thigh. The extremity was exsanguinated.  Tourniquet was inflated to 350 mmHg. A longitudinal incision was then made over the lateral malleolus.  Sharp dissection was carried down through the skin.  Blunt dissection was carried down through the subcutaneous tissue.  Superficial peroneal nerve was identified and was protected throughout the case.  The fibular fracture was identified.  It was a long oblique fracture.  It was opened, irrigated, and cleaned of all hematoma.  The fracture was reduced and clamped with 2 lobster claws.  Two 3.5 mm fully-threaded lag screws were inserted from anterior-posterior across the fracture site. An 8-hole 1/3rd tubular plate was then placed over the fracture leaving the central 2 holes empty and placed over the lag screws.  Proximally the plate was fixed with 3 bicortical 3.5 mm fully-threaded screws. Distally, it was fixed with a single 3.5 mm fully-threaded cortical screw, leaving the 2 most distal holes open.  At this point, a mortise view was obtained.  A stab incision was made over the medial malleolus. A large Weber  tenaculum was placed over the syndesmosis with the lateral tine and the most distal screw head and the medial tine in the stab incision.  The syndesmosis was compressed.  Mortise and lateral views were obtained showing appropriate reduction of the syndesmosis and ankle mortise.  Two 3.5 mm fully-threaded cortical screws were then inserted across the syndesmosis through all 4 cortices of the distal fibula and tibia.  Final AP, mortise, and lateral views showed appropriate reduction of the ankle mortise and appropriate  position and length of all hardware.  A mortise view was then obtained.  Dorsiflexion of external rotation stress was applied.  There was no instability noted at the deltoid ligament.  The wound was then irrigated copiously.  Inverted simple sutures of 2-0 Vicryl were used to close the periosteum over the plate.  The subcutaneous tissue was then approximated with inverted simple sutures of 3-0 Monocryl and a running 3-0 Prolene was used to close the skin incision.  Sterile dressings were applied after a medial stab incision was closed with simple 3-0 Prolene suture.  A well-padded short-leg splint was then applied.  The tourniquet was released at 49 minutes after application of the dressings.  The patient was then awakened from anesthesia and transported to the recovery room in stable condition.  FOLLOWUP PLAN:  The patient will be nonweightbearing on the left lower extremity.  She will be observed overnight for pain control.  She will follow up with me in 2 weeks for suture removal and conversion to a cast.  Given her history of cigarette smoking, she will be anticoagulated for 2 weeks with Xarelto.     Toni Arthurs, MD     JH/MEDQ  D:  11/03/2011  T:  11/04/2011  Job:  [id number]

## 2011-11-05 ENCOUNTER — Other Ambulatory Visit (INDEPENDENT_AMBULATORY_CARE_PROVIDER_SITE_OTHER): Payer: 59

## 2011-11-05 ENCOUNTER — Telehealth: Payer: Self-pay | Admitting: Family Medicine

## 2011-11-05 DIAGNOSIS — E039 Hypothyroidism, unspecified: Secondary | ICD-10-CM

## 2011-11-05 DIAGNOSIS — Z13220 Encounter for screening for lipoid disorders: Secondary | ICD-10-CM

## 2011-11-05 LAB — COMPREHENSIVE METABOLIC PANEL
Alkaline Phosphatase: 100 U/L (ref 39–117)
Glucose, Bld: 76 mg/dL (ref 70–99)
Sodium: 141 mEq/L (ref 135–145)
Total Bilirubin: 0.7 mg/dL (ref 0.3–1.2)
Total Protein: 7 g/dL (ref 6.0–8.3)

## 2011-11-05 LAB — LIPID PANEL
Cholesterol: 162 mg/dL (ref 0–200)
HDL: 36.3 mg/dL — ABNORMAL LOW (ref >39.00)
LDL Cholesterol: 108 mg/dL — ABNORMAL HIGH (ref 0–99)
Triglycerides: 88 mg/dL (ref 0.0–149.0)
VLDL: 17.6 mg/dL (ref 0.0–40.0)

## 2011-11-05 NOTE — Telephone Encounter (Signed)
Message copied by Excell Seltzer on Thu Nov 05, 2011  8:11 AM ------      Message from: Alvina Chou      Created: Mon Nov 02, 2011  3:54 PM      Regarding: Lab orders for , Thursday 11-05-11       Patient is scheduled for CPX labs, please order future labs, Thanks , Camelia Eng

## 2011-11-10 ENCOUNTER — Encounter: Payer: Self-pay | Admitting: Family Medicine

## 2011-11-10 ENCOUNTER — Ambulatory Visit (INDEPENDENT_AMBULATORY_CARE_PROVIDER_SITE_OTHER): Payer: 59 | Admitting: Family Medicine

## 2011-11-10 ENCOUNTER — Other Ambulatory Visit (HOSPITAL_COMMUNITY)
Admission: RE | Admit: 2011-11-10 | Discharge: 2011-11-10 | Disposition: A | Discharge status: Home / Self Care | Payer: 59 | Source: Ambulatory Visit | Attending: Family Medicine | Admitting: Family Medicine

## 2011-11-10 VITALS — BP 130/82 | HR 90 | Temp 98.5°F | Ht 65.75 in | Wt 269.5 lb

## 2011-11-10 DIAGNOSIS — Z720 Tobacco use: Secondary | ICD-10-CM | POA: Insufficient documentation

## 2011-11-10 DIAGNOSIS — Z01419 Encounter for gynecological examination (general) (routine) without abnormal findings: Secondary | ICD-10-CM

## 2011-11-10 DIAGNOSIS — E039 Hypothyroidism, unspecified: Secondary | ICD-10-CM

## 2011-11-10 DIAGNOSIS — Z87891 Personal history of nicotine dependence: Secondary | ICD-10-CM | POA: Insufficient documentation

## 2011-11-10 DIAGNOSIS — Z1151 Encounter for screening for human papillomavirus (HPV): Secondary | ICD-10-CM | POA: Insufficient documentation

## 2011-11-10 DIAGNOSIS — Z0000 Encounter for general adult medical examination without abnormal findings: Secondary | ICD-10-CM

## 2011-11-10 MED ORDER — OMEPRAZOLE 20 MG PO CPDR: 20.0000 mg | DELAYED_RELEASE_CAPSULE | Freq: Every day | ORAL | Status: DC

## 2011-11-10 MED ORDER — LEVOTHYROXINE SODIUM 25 MCG PO TABS: 25.0000 ug | ORAL_TABLET | Freq: Every day | ORAL | Status: DC

## 2011-11-10 NOTE — Progress Notes (Signed)
Subjective:    Patient ID: Jamie Mckenzie, female    DOB: [birth year], 41 y.o.   MRN: [id number]  HPI The patient is here for annual wellness exam and preventative care.    8/31 surgery left ankle, Dr. Victorino Dike, doing well no complications.  off her feet for 6 weeks. Using oxycodone for pain, needing colace and senokot for constipation from narcotics.  Hypothyroid:  Stable on 25 mcg levothyroxine Lab Results  Component Value Date   TSH 1.25 11/05/2011   Has been having some irregular menses.. No menses in last 3 months then restarted last week. Feels it may be perimenopause. Mother went through menopause early.  Depression, well controlled  On no medication.  Wt Readings from Last 3 Encounters:  11/10/11 269 lb 8 oz (122.244 kg)  11/03/11 276 lb (125.193 kg)  11/03/11 276 lb (125.193 kg)   Diet: good control, low carb Exercise: limited  Reviewed labs in deatil  Lab Results  Component Value Date   CHOL 162 11/05/2011   HDL 36.30* 11/05/2011   LDLCALC 108* 11/05/2011   TRIG 88.0 11/05/2011   CHOLHDL 4 11/05/2011    Review of Systems  Constitutional: Negative for fever, fatigue and unexpected weight change.  HENT: Negative for ear pain, congestion, sore throat, sneezing, trouble swallowing and sinus pressure.   Eyes: Negative for pain and itching.  Respiratory: Negative for cough, shortness of breath and wheezing.   Cardiovascular: Negative for chest pain, palpitations and leg swelling.  Gastrointestinal: Negative for nausea, abdominal pain, diarrhea, constipation and blood in stool.  Genitourinary: Negative for dysuria, hematuria, vaginal discharge, difficulty urinating and menstrual problem.  Skin: Negative for rash.  Neurological: Negative for syncope, weakness, light-headedness, numbness and headaches.  Psychiatric/Behavioral: Negative for confusion and dysphoric mood. The patient is not nervous/anxious.        Objective:   Physical Exam  Constitutional: Vital signs  are normal. She appears well-developed and well-nourished. She is cooperative.  Non-toxic appearance. She does not appear ill. No distress.       Morbidly obese  HENT:  Head: Normocephalic.  Right Ear: Hearing, tympanic membrane, external ear and ear canal normal.  Left Ear: Hearing, tympanic membrane, external ear and ear canal normal.  Nose: Nose normal.  Eyes: Conjunctivae normal, EOM and lids are normal. Pupils are equal, round, and reactive to light. No foreign bodies found.  Neck: Trachea normal and normal range of motion. Neck supple. Carotid bruit is not present. No mass and no thyromegaly present.  Cardiovascular: Normal rate, regular rhythm, S1 normal, S2 normal, normal heart sounds and intact distal pulses.  Exam reveals no gallop.   No murmur heard. Pulmonary/Chest: Effort normal and breath sounds normal. No respiratory distress. She has no wheezes. She has no rhonchi. She has no rales.  Abdominal: Soft. Normal appearance and bowel sounds are normal. She exhibits no distension, no fluid wave, no abdominal bruit and no mass. There is no hepatosplenomegaly. There is no tenderness. There is no rebound, no guarding and no CVA tenderness. No hernia.  Genitourinary: Vagina normal and uterus normal. No breast swelling, tenderness, discharge or bleeding. Pelvic exam was performed with patient prone. There is no rash, tenderness or lesion on the right labia. There is no rash, tenderness or lesion on the left labia. Uterus is not enlarged and not tender. Cervix exhibits no motion tenderness, no discharge and no friability. Right adnexum displays no mass, no tenderness and no fullness. Left adnexum displays no mass, no tenderness and no  fullness.  Musculoskeletal: She exhibits tenderness.       Left foot in cast  Lymphadenopathy:    She has no cervical adenopathy.    She has no axillary adenopathy.  Neurological: She is alert. She has normal strength. No cranial nerve deficit or sensory deficit.    Skin: Skin is warm, dry and intact. No rash noted.  Psychiatric: Her speech is normal and behavior is normal. Judgment normal. Her mood appears not anxious. Cognition and memory are normal. She does not exhibit a depressed mood.          Assessment & Plan:  The patient's preventative maintenance and recommended screening tests for an annual wellness exam were reviewed in full today. Brought up to date unless services declined.  Counselled on the importance of diet, exercise, and its role in overall health and mortality. The patient's FH and SH was reviewed, including their home life, tobacco status, and drug and alcohol status.   Vaccines:uptodate offered flu vaccine. Will get at work. Mammo: nml 05/2011  PAP/DVE: nml 2012, plans this year and next for PAP... If normal space to q2 years, yearly DVE. QUIT smoking

## 2011-11-10 NOTE — Patient Instructions (Addendum)
Get flu vaccine at work. Work on regular exercise, healthy diet and weight loss. Look into activating MyChart Access.

## 2011-11-12 ENCOUNTER — Other Ambulatory Visit: Payer: Self-pay

## 2011-11-16 ENCOUNTER — Telehealth: Payer: Self-pay | Admitting: *Deleted

## 2011-11-16 DIAGNOSIS — E039 Hypothyroidism, unspecified: Secondary | ICD-10-CM

## 2011-11-16 NOTE — Telephone Encounter (Signed)
Patient is currently taking Levoxyl 25 mcg daily; however, this brand is no longer available due to recall.  Please switch to either Levothyroxine ($4 / 30 day supply) or Synthroid (~$25 for a 30 day supply).  Please send new Rx to our pharmacy.  [facility].

## 2011-11-17 ENCOUNTER — Other Ambulatory Visit: Payer: Self-pay

## 2011-11-17 MED ORDER — LEVOTHYROXINE SODIUM 25 MCG PO TABS: 25.0000 ug | ORAL_TABLET | Freq: Every day | ORAL | Status: DC

## 2011-11-17 NOTE — Telephone Encounter (Signed)
Changed med to levothyroxine generic. Have pt return for TSH on this med in 4-6 weeks.

## 2011-11-18 ENCOUNTER — Telehealth: Payer: Self-pay

## 2011-11-18 NOTE — Telephone Encounter (Signed)
Refill authorization request form [facility] outpatient [facility] for Levoxyl 25 mg, form is in your basket.

## 2011-11-19 ENCOUNTER — Other Ambulatory Visit: Payer: Self-pay

## 2012-03-07 ENCOUNTER — Telehealth: Payer: Self-pay | Admitting: Family Medicine

## 2012-03-07 NOTE — Telephone Encounter (Signed)
Pt left vm requesting RX for Ativan to fly out of the country on Wednesday.  She states Dr. Ermalene Searing has done this for her previously to help with her anxiety with flying.

## 2012-03-08 MED ORDER — LORAZEPAM 0.5 MG PO TABS: 0.5000 mg | ORAL_TABLET | Freq: Every day | ORAL | Status: DC | PRN

## 2012-03-08 NOTE — Telephone Encounter (Signed)
rx called to pharmacy 

## 2012-03-26 DIAGNOSIS — Z969 Presence of functional implant, unspecified: Secondary | ICD-10-CM

## 2012-03-26 DIAGNOSIS — N951 Menopausal and female climacteric states: Secondary | ICD-10-CM

## 2012-03-26 HISTORY — DX: Menopausal and female climacteric states: N95.1

## 2012-03-26 HISTORY — DX: Presence of functional implant, unspecified: Z96.9

## 2012-04-09 ENCOUNTER — Other Ambulatory Visit: Payer: Self-pay

## 2012-04-15 ENCOUNTER — Encounter (HOSPITAL_BASED_OUTPATIENT_CLINIC_OR_DEPARTMENT_OTHER): Payer: Self-pay | Admitting: *Deleted

## 2012-04-15 DIAGNOSIS — S60811A Abrasion of right wrist, initial encounter: Secondary | ICD-10-CM

## 2012-04-15 DIAGNOSIS — R059 Cough, unspecified: Secondary | ICD-10-CM

## 2012-04-15 HISTORY — DX: Cough, unspecified: R05.9

## 2012-04-15 HISTORY — DX: Abrasion of right wrist, initial encounter: S60.811A

## 2012-04-20 ENCOUNTER — Other Ambulatory Visit: Payer: Self-pay | Admitting: Orthopedic Surgery

## 2012-04-21 ENCOUNTER — Encounter (HOSPITAL_BASED_OUTPATIENT_CLINIC_OR_DEPARTMENT_OTHER)
Admission: RE | Disposition: A | Discharge status: Home / Self Care | Payer: Self-pay | Source: Ambulatory Visit | Attending: Orthopedic Surgery

## 2012-04-21 ENCOUNTER — Ambulatory Visit (HOSPITAL_BASED_OUTPATIENT_CLINIC_OR_DEPARTMENT_OTHER)
Admission: RE | Admit: 2012-04-21 | Discharge: 2012-04-21 | Disposition: A | Discharge status: Home / Self Care | Payer: 59 | Source: Ambulatory Visit | Attending: Orthopedic Surgery | Admitting: Orthopedic Surgery

## 2012-04-21 ENCOUNTER — Encounter (HOSPITAL_BASED_OUTPATIENT_CLINIC_OR_DEPARTMENT_OTHER): Payer: Self-pay | Admitting: Certified Registered Nurse Anesthetist

## 2012-04-21 ENCOUNTER — Ambulatory Visit (HOSPITAL_BASED_OUTPATIENT_CLINIC_OR_DEPARTMENT_OTHER): Payer: 59 | Admitting: Certified Registered Nurse Anesthetist

## 2012-04-21 DIAGNOSIS — Z969 Presence of functional implant, unspecified: Secondary | ICD-10-CM

## 2012-04-21 DIAGNOSIS — Z472 Encounter for removal of internal fixation device: Secondary | ICD-10-CM | POA: Insufficient documentation

## 2012-04-21 HISTORY — DX: Dental restoration status: Z98.811

## 2012-04-21 HISTORY — DX: Presence of functional implant, unspecified: Z96.9

## 2012-04-21 HISTORY — DX: Cough: R05

## 2012-04-21 HISTORY — DX: Unspecified osteoarthritis, unspecified site: M19.90

## 2012-04-21 HISTORY — PX: HARDWARE REMOVAL: SHX979

## 2012-04-21 HISTORY — DX: Hypothyroidism, unspecified: E03.9

## 2012-04-21 HISTORY — DX: Abrasion of right wrist, initial encounter: S60.811A

## 2012-04-21 HISTORY — DX: Other specified disorders of nose and nasal sinuses: J34.89

## 2012-04-21 HISTORY — DX: Menopausal and female climacteric states: N95.1

## 2012-04-21 LAB — POCT HEMOGLOBIN-HEMACUE: Hemoglobin: 14.3 g/dL (ref 12.0–15.0)

## 2012-04-21 SURGERY — REMOVAL, HARDWARE
Anesthesia: General | Site: Ankle | Laterality: Left | Wound class: Clean

## 2012-04-21 MED ORDER — CEFAZOLIN SODIUM-DEXTROSE 2-3 GM-% IV SOLR
2.0000 g | INTRAVENOUS | Status: AC
  Administered 2012-04-21: 3 g via INTRAVENOUS

## 2012-04-21 MED ORDER — FENTANYL CITRATE 0.05 MG/ML IJ SOLN
INTRAMUSCULAR | Status: DC | PRN
  Administered 2012-04-21: 25 ug via INTRAVENOUS
  Administered 2012-04-21 (×2): 50 ug via INTRAVENOUS

## 2012-04-21 MED ORDER — LACTATED RINGERS IV SOLN
INTRAVENOUS | Status: DC
  Administered 2012-04-21 (×2): via INTRAVENOUS

## 2012-04-21 MED ORDER — FENTANYL CITRATE 0.05 MG/ML IJ SOLN: 50.0000 ug | INTRAMUSCULAR | Status: DC | PRN

## 2012-04-21 MED ORDER — SODIUM CHLORIDE 0.9 % IV SOLN: INTRAVENOUS | Status: DC

## 2012-04-21 MED ORDER — 0.9 % SODIUM CHLORIDE (POUR BTL) OPTIME
TOPICAL | Status: DC | PRN
  Administered 2012-04-21: 200 mL

## 2012-04-21 MED ORDER — PROPOFOL 10 MG/ML IV BOLUS
INTRAVENOUS | Status: DC | PRN
  Administered 2012-04-21: 200 mg via INTRAVENOUS

## 2012-04-21 MED ORDER — BUPIVACAINE-EPINEPHRINE PF 0.5-1:200000 % IJ SOLN
INTRAMUSCULAR | Status: DC | PRN
  Administered 2012-04-21: 8 mL

## 2012-04-21 MED ORDER — LIDOCAINE HCL (CARDIAC) 20 MG/ML IV SOLN
INTRAVENOUS | Status: DC | PRN
  Administered 2012-04-21: 60 mg via INTRAVENOUS

## 2012-04-21 MED ORDER — ONDANSETRON HCL 4 MG/2ML IJ SOLN
INTRAMUSCULAR | Status: DC | PRN
  Administered 2012-04-21: 4 mg via INTRAVENOUS

## 2012-04-21 MED ORDER — MIDAZOLAM HCL 2 MG/ML PO SYRP: 12.0000 mg | ORAL_SOLUTION | Freq: Once | ORAL | Status: DC | PRN

## 2012-04-21 MED ORDER — OXYCODONE HCL 5 MG PO TABS: 5.0000 mg | ORAL_TABLET | Freq: Once | ORAL | Status: DC | PRN

## 2012-04-21 MED ORDER — HYDROMORPHONE HCL PF 1 MG/ML IJ SOLN
0.2500 mg | INTRAMUSCULAR | Status: DC | PRN
  Administered 2012-04-21: 0.25 mg via INTRAVENOUS

## 2012-04-21 MED ORDER — ONDANSETRON HCL 4 MG/2ML IJ SOLN: 4.0000 mg | Freq: Once | INTRAMUSCULAR | Status: DC | PRN

## 2012-04-21 MED ORDER — OXYCODONE HCL 5 MG/5ML PO SOLN: 5.0000 mg | Freq: Once | ORAL | Status: DC | PRN

## 2012-04-21 MED ORDER — CHLORHEXIDINE GLUCONATE 4 % EX LIQD
60.0000 mL | Freq: Once | CUTANEOUS | Status: AC
  Administered 2012-04-21: 4 via TOPICAL

## 2012-04-21 MED ORDER — DEXAMETHASONE SODIUM PHOSPHATE 10 MG/ML IJ SOLN
INTRAMUSCULAR | Status: DC | PRN
  Administered 2012-04-21: 10 mg via INTRAVENOUS

## 2012-04-21 MED ORDER — MIDAZOLAM HCL 5 MG/5ML IJ SOLN
INTRAMUSCULAR | Status: DC | PRN
  Administered 2012-04-21: 2 mg via INTRAVENOUS

## 2012-04-21 MED ORDER — MIDAZOLAM HCL 2 MG/2ML IJ SOLN: 1.0000 mg | INTRAMUSCULAR | Status: DC | PRN

## 2012-04-21 MED ORDER — BACITRACIN ZINC 500 UNIT/GM EX OINT
TOPICAL_OINTMENT | CUTANEOUS | Status: DC | PRN
  Administered 2012-04-21: 1 via TOPICAL

## 2012-04-21 SURGICAL SUPPLY — 68 items
BAG DECANTER FOR FLEXI CONT (MISCELLANEOUS) IMPLANT
BANDAGE ELASTIC 4 VELCRO ST LF (GAUZE/BANDAGES/DRESSINGS) IMPLANT
BANDAGE ESMARK 6X9 LF (GAUZE/BANDAGES/DRESSINGS) ×1 IMPLANT
BLADE SURG 15 STRL LF DISP TIS (BLADE) ×2 IMPLANT
BLADE SURG 15 STRL SS (BLADE) ×2
BNDG COHESIVE 4X5 TAN STRL (GAUZE/BANDAGES/DRESSINGS) ×2 IMPLANT
BNDG COHESIVE 6X5 TAN STRL LF (GAUZE/BANDAGES/DRESSINGS) IMPLANT
BNDG ESMARK 4X9 LF (GAUZE/BANDAGES/DRESSINGS) IMPLANT
BNDG ESMARK 6X9 LF (GAUZE/BANDAGES/DRESSINGS) ×2
CHLORAPREP W/TINT 26ML (MISCELLANEOUS) ×2 IMPLANT
CLOTH BEACON ORANGE TIMEOUT ST (SAFETY) ×2 IMPLANT
COVER TABLE BACK 60X90 (DRAPES) ×2 IMPLANT
CUFF TOURNIQUET SINGLE 34IN LL (TOURNIQUET CUFF) ×2 IMPLANT
DECANTER SPIKE VIAL GLASS SM (MISCELLANEOUS) ×2 IMPLANT
DRAPE EXTREMITY T 121X128X90 (DRAPE) ×2 IMPLANT
DRAPE OEC MINIVIEW 54X84 (DRAPES) ×2 IMPLANT
DRAPE SURG 17X23 STRL (DRAPES) IMPLANT
DRAPE U-SHAPE 47X51 STRL (DRAPES) ×2 IMPLANT
DRSG EMULSION OIL 3X3 NADH (GAUZE/BANDAGES/DRESSINGS) ×2 IMPLANT
DRSG PAD ABDOMINAL 8X10 ST (GAUZE/BANDAGES/DRESSINGS) ×2 IMPLANT
DRSG TEGADERM 4X4.75 (GAUZE/BANDAGES/DRESSINGS) IMPLANT
ELECT REM PT RETURN 9FT ADLT (ELECTROSURGICAL) ×2
ELECTRODE REM PT RTRN 9FT ADLT (ELECTROSURGICAL) ×1 IMPLANT
GLOVE BIO SURGEON STRL SZ8 (GLOVE) ×4 IMPLANT
GLOVE BIOGEL PI IND STRL 6.5 (GLOVE) ×1 IMPLANT
GLOVE BIOGEL PI IND STRL 8 (GLOVE) ×1 IMPLANT
GLOVE BIOGEL PI IND STRL 8.5 (GLOVE) ×1 IMPLANT
GLOVE BIOGEL PI INDICATOR 6.5 (GLOVE) ×1
GLOVE BIOGEL PI INDICATOR 8 (GLOVE) ×1
GLOVE BIOGEL PI INDICATOR 8.5 (GLOVE) ×1
GLOVE ECLIPSE 6.5 STRL STRAW (GLOVE) ×2 IMPLANT
GLOVE EXAM NITRILE MD LF STRL (GLOVE) ×2 IMPLANT
GOWN BRE IMP PREV XXLGXLNG (GOWN DISPOSABLE) ×2 IMPLANT
GOWN PREVENTION PLUS XLARGE (GOWN DISPOSABLE) ×2 IMPLANT
GOWN PREVENTION PLUS XXLARGE (GOWN DISPOSABLE) ×4 IMPLANT
NEEDLE HYPO 22GX1.5 SAFETY (NEEDLE) IMPLANT
PACK BASIN DAY SURGERY FS (CUSTOM PROCEDURE TRAY) ×2 IMPLANT
PAD CAST 4YDX4 CTTN HI CHSV (CAST SUPPLIES) ×1 IMPLANT
PADDING CAST ABS 4INX4YD NS (CAST SUPPLIES)
PADDING CAST ABS COTTON 4X4 ST (CAST SUPPLIES) IMPLANT
PADDING CAST COTTON 4X4 STRL (CAST SUPPLIES) ×1
PADDING CAST COTTON 6X4 STRL (CAST SUPPLIES) IMPLANT
PENCIL BUTTON HOLSTER BLD 10FT (ELECTRODE) IMPLANT
SHEET MEDIUM DRAPE 40X70 STRL (DRAPES) ×2 IMPLANT
SLEEVE SCD COMPRESS KNEE MED (MISCELLANEOUS) ×2 IMPLANT
SPLINT FAST PLASTER 5X30 (CAST SUPPLIES)
SPLINT PLASTER CAST FAST 5X30 (CAST SUPPLIES) IMPLANT
SPONGE GAUZE 4X4 12PLY (GAUZE/BANDAGES/DRESSINGS) ×2 IMPLANT
SPONGE LAP 18X18 X RAY DECT (DISPOSABLE) ×2 IMPLANT
STAPLER VISISTAT 35W (STAPLE) IMPLANT
STOCKINETTE 6  STRL (DRAPES) ×1
STOCKINETTE 6 STRL (DRAPES) ×1 IMPLANT
STRIP CLOSURE SKIN 1/2X4 (GAUZE/BANDAGES/DRESSINGS) IMPLANT
SUCTION FRAZIER TIP 10 FR DISP (SUCTIONS) IMPLANT
SUT ETHIBOND 0 MO6 C/R (SUTURE) IMPLANT
SUT ETHIBOND 2 OS 4 DA (SUTURE) IMPLANT
SUT ETHILON 3 0 PS 1 (SUTURE) ×2 IMPLANT
SUT MNCRL AB 3-0 PS2 18 (SUTURE) ×2 IMPLANT
SUT MNCRL AB 4-0 PS2 18 (SUTURE) IMPLANT
SUT VIC AB 0 CT1 27 (SUTURE)
SUT VIC AB 0 CT1 27XBRD ANBCTR (SUTURE) IMPLANT
SUT VIC AB 2-0 SH 18 (SUTURE) IMPLANT
SUT VIC AB 2-0 SH 27 (SUTURE)
SUT VIC AB 2-0 SH 27XBRD (SUTURE) IMPLANT
SUT VICRYL 4-0 PS2 18IN ABS (SUTURE) IMPLANT
SYR BULB 3OZ (MISCELLANEOUS) ×2 IMPLANT
TUBE CONNECTING 20X1/4 (TUBING) IMPLANT
UNDERPAD 30X30 INCONTINENT (UNDERPADS AND DIAPERS) ×2 IMPLANT

## 2012-04-21 NOTE — H&P (Signed)
Jamie Mckenzie is an 42 y.o. female.   Chief Complaint: left ankle retained hardware HPI: 42 y/o female s/p orif of left ankle fracture in September 13.  She has a retained syndesmosis screw and presents now for removal.    Past Medical History  Diagnosis Date  . GERD (gastroesophageal reflux disease)   . Hypothyroidism   . Arthritis     ankles  . Retained orthopedic hardware 03/2012    left ankle  . Stuffy and runny nose     clear drainage from nose - URI onset 03/30/2012  . Cough 04/15/2012  . Abrasion of right wrist 04/15/2012  . Dental crowns present   . Perimenopausal 03/2012    irregular periods    Past Surgical History  Procedure Laterality Date  . Orif ankle fracture Right 1985  . Hysteroscopy w/d&c  10/24/2009    with polypectomy  . Orif ankle fracture  11/03/2011    Procedure: OPEN REDUCTION INTERNAL FIXATION (ORIF) ANKLE FRACTURE;  Surgeon: Toni Arthurs, MD;  Location: [facility];  Service: Orthopedics;  Laterality: Left;  Open Reduction Internal Fixation left ankle lateral malleolus fracture, left syndesmosis  . Multiple tooth extractions    . Dilation and evacuation  11/15/2008    Family History  Problem Relation Age of Onset  . Hypertension Mother   . Arthritis Mother   . Obesity Mother     morbid  . Myasthenia gravis Mother   . Cancer Father     non hodgkins lymphoma  . Cancer Maternal Grandmother     lung  . Heart disease Maternal Grandfather     heart attack  . COPD Paternal Grandfather    Social History:  reports that she has been smoking Cigarettes.  She has been smoking about 0.00 packs per day for the past 10 years. She has never used smokeless tobacco. She reports that  drinks alcohol. She reports that she does not use illicit drugs.  Allergies: No Known Allergies  Medications Prior to Admission  Medication Sig Dispense Refill  . levothyroxine (SYNTHROID, LEVOTHROID) 25 MCG tablet Take 1 tablet (25 mcg total) by mouth daily.  90  tablet  3  . omeprazole (PRILOSEC) 20 MG capsule Take 1 capsule (20 mg total) by mouth daily.  90 capsule  3    Results for orders placed during the hospital encounter of 04/21/12 (from the past 48 hour(s))  POCT HEMOGLOBIN-HEMACUE     Status: None   Collection Time    04/21/12  6:56 AM      Result Value Range   Hemoglobin 14.3  12.0 - 15.0 g/dL   No results found.  ROS no recent f/c/n/v/wt loss.  Blood pressure 123/81, pulse 72, temperature 97.4 F (36.3 C), temperature source Oral, resp. rate 20, height 5' 5.75" (1.67 m), weight 124.966 kg (275 lb 8 oz), last menstrual period 04/18/2012, SpO2 98.00%. Physical Exam wn wd woman in nad.  A and O x 4. Mood and affect normal.  EOMI  Respirations unlabored. L ankle with healed surgical incisions.  2+ dpa nd pt pulses.  Feels LT normally about the foot and ankle.  5/5 strength in PF and DF of the ankle.  No lymphadenopathy.  Assessment/Plan L ankle retained syndesmosis screw - to OR for removal of the screw.  The risks and benefits of the alternative treatment options have been discussed in detail.  The patient wishes to proceed with surgery and specifically understands risks of bleeding, infection, nerve damage, blood  clots, need for additional surgery, amputation and death.   Toni Arthurs 2012/04/29, 7:22 AM

## 2012-04-21 NOTE — Transfer of Care (Signed)
Immediate Anesthesia Transfer of Care Note  Patient: Jamie Mckenzie  Procedure(s) Performed: Procedure(s) with comments: HARDWARE REMOVAL (Left) - LEFT ANKLE HARDWARE REMOVAL  Patient Location: PACU  Anesthesia Type:General  Level of Consciousness: awake, alert , oriented and patient cooperative  Airway & Oxygen Therapy: Patient Spontanous Breathing and Patient connected to face mask oxygen  Post-op Assessment: Report given to PACU RN and Post -op Vital signs reviewed and stable  Post vital signs: Reviewed and stable  Complications: No apparent anesthesia complications

## 2012-04-21 NOTE — Anesthesia Preprocedure Evaluation (Addendum)
Anesthesia Evaluation  Patient identified by MRN, date of birth, ID band Patient awake    Reviewed: Allergy & Precautions, H&P , NPO status , Patient's Chart, lab work & pertinent test results  Airway Mallampati: I TM Distance: >3 FB Neck ROM: Full    Dental  (+) Teeth Intact, Dental Advisory Given and Poor Dentition   Pulmonary  breath sounds clear to auscultation        Cardiovascular Rhythm:Regular Rate:Normal     Neuro/Psych    GI/Hepatic GERD-  Medicated,  Endo/Other  Morbid obesity  Renal/GU      Musculoskeletal   Abdominal   Peds  Hematology   Anesthesia Other Findings   Reproductive/Obstetrics                          Anesthesia Physical Anesthesia Plan  ASA: III  Anesthesia Plan: General   Post-op Pain Management:    Induction: Intravenous  Airway Management Planned: LMA  Additional Equipment:   Intra-op Plan:   Post-operative Plan: Extubation in OR  Informed Consent: I have reviewed the patients History and Physical, chart, labs and discussed the procedure including the risks, benefits and alternatives for the proposed anesthesia with the patient or authorized representative who has indicated his/her understanding and acceptance.   Dental advisory given  Plan Discussed with: CRNA, Anesthesiologist and Surgeon  Anesthesia Plan Comments:         Anesthesia Quick Evaluation

## 2012-04-21 NOTE — Brief Op Note (Signed)
04/21/2012  8:11 AM  PATIENT:  Florence Canner Godman  42 y.o. female  PRE-OPERATIVE DIAGNOSIS:  LEFT ANKLE RETAINED HARDWARE  POST-OPERATIVE DIAGNOSIS:  LEFT ANKLE RETAINED HARDWARE  Procedure(s): 1.  Left ankle removal of syndesmosis screws x 2 2.  AP and lateral xrays of the left ankle  SURGEON:  Toni Arthurs, MD  ASSISTANT: n/a  ANESTHESIA:   General  EBL:  minimal   TOURNIQUET:   Total Tourniquet Time Documented: Thigh (Left) - 12 minutes Total: Thigh (Left) - 12 minutes   COMPLICATIONS:  None apparent  DISPOSITION:  Extubated, awake and stable to recovery.  DICTATION ID:  [id number]

## 2012-04-21 NOTE — Anesthesia Postprocedure Evaluation (Signed)
  Anesthesia Post-op Note  Patient: Jamie Mckenzie  Procedure(s) Performed: Procedure(s) with comments: HARDWARE REMOVAL (Left) - LEFT ANKLE HARDWARE REMOVAL  Patient Location: PACU  Anesthesia Type:General  Level of Consciousness: awake, alert  and oriented  Airway and Oxygen Therapy: Patient Spontanous Breathing and Patient connected to face mask oxygen  Post-op Pain: mild  Post-op Assessment: Post-op Vital signs reviewed  Post-op Vital Signs: Reviewed  Complications: No apparent anesthesia complications

## 2012-04-21 NOTE — Anesthesia Procedure Notes (Signed)
Procedure Name: LMA Insertion Date/Time: 04/21/2012 7:33 AM Performed by: Wadsworth Skolnick D Pre-anesthesia Checklist: Patient identified, Emergency Drugs available, Suction available and Patient being monitored Patient Re-evaluated:Patient Re-evaluated prior to inductionOxygen Delivery Method: Circle System Utilized Preoxygenation: Pre-oxygenation with 100% oxygen Intubation Type: IV induction Ventilation: Mask ventilation without difficulty LMA: LMA inserted LMA Size: 4.0 Number of attempts: 1 Airway Equipment and Method: bite block Placement Confirmation: positive ETCO2 Tube secured with: Tape Dental Injury: Teeth and Oropharynx as per pre-operative assessment

## 2012-04-25 ENCOUNTER — Encounter (HOSPITAL_BASED_OUTPATIENT_CLINIC_OR_DEPARTMENT_OTHER): Payer: Self-pay | Admitting: Orthopedic Surgery

## 2012-04-25 NOTE — Op Note (Signed)
NAMEADELIS, DOCTER            ACCOUNT NO.:  [id number]  MEDICAL RECORD NO.:  [id number]  LOCATION:                               FACILITY:  [facility]  PHYSICIAN:  Toni Arthurs, MD        DATE OF BIRTH:  05-19-1970  DATE OF PROCEDURE:  04/21/2012 DATE OF DISCHARGE:  04/21/2012                              OPERATIVE REPORT   PREOPERATIVE DIAGNOSIS:  Retained left ankle syndesmotic screws status post open reduction and internal fixation of left ankle bimalleolar fracture and syndesmosis disruption.  POSTOPERATIVE DIAGNOSIS:  Retained left ankle syndesmotic screws status post open reduction and internal fixation of left ankle bimalleolar fracture and syndesmosis disruption.  PROCEDURE: 1. Removal of deep implants (2 syndesmosis screws). 2. Intraoperative AP and lateral views of the AP and lateral x-rays of     the left ankle.  SURGEON:  Toni Arthurs, MD  ANESTHESIA:  General.  ESTIMATED BLOOD LOSS:  Minimal.  TOURNIQUET TIME:  Twelve minutes at 250 mmHg.  COMPLICATIONS:  None apparent.  DISPOSITION:  Extubated awake and stable to recovery room.  INDICATIONS FOR PROCEDURE:  The patient is a 42 year old female who has a history of left ankle injury back in September 2013.  She underwent open reduction and internal fixation of her bimalleolar fracture and syndesmosis disruption.  She has 2 intact syndesmosis screws still in place.  She presents now for removal of these screws.  She understands the risks and benefits, the alternative treatment options and elects for surgical treatment.  She specifically understands risks of bleeding, infection, nerve damage, blood clots, need for additional surgery, amputation, and death.  PROCEDURE IN DETAIL:  After preoperative consent was obtained, the correct operative site was identified.  The patient was brought to the operating room and placed supine on the operating table.  General anesthesia was induced.  Preoperative antibiotics were  administered. Surgical time-out was taken.  Left lower extremity was prepped and draped in standard sterile fashion with tourniquet around the thigh. The extremity was exsanguinated and tourniquet was inflated to 250 mmHg. An AP fluoroscopic image was obtained localizing the 2 screws for removal.  The patient's previous incision was opened again sharply with a scalpel over the 2 screws.  Sharp dissection was carried down through the skin and subcutaneous tissue.  The proximal screw was identified first.  The head was cleaned of all fibrous tissue.  A screwdriver was used to remove the screw in its entirety.  This was then repeated for the more distal of the 2 screws.  It was again removed in its entirety. The wound was irrigated copiously.  AP and lateral fluoroscopic images were obtained showing complete removal of both syndesmosis screws.  Deep subcutaneous tissue was approximated with inverted simple sutures of 3-0 Monocryl.  The superficial subcutaneous tissue was closed also with 3-0 Monocryl.  The skin incision was closed with horizontal mattress sutures of 3-0 nylon.  Sterile dressings were applied followed by compression wrap.  Tourniquet was released at 12 minutes.  The patient was awakened from anesthesia and transported to the recovery room in stable condition.  After closure 8 mL of 0.5% Marcaine with epinephrine was infiltrated into the subcutaneous tissue  around the surgical site for postoperative pain control.  FOLLOWUP PLAN:  The patient will be weightbearing as tolerated on her left foot.  She will follow up with me in 2 weeks for suture removal.  Radiographs AP and lateral x-rays of the ankle were obtained, nonweightbearing today.  These show healed lateral malleolus fracture. Interval removal of her 2 syndesmosis screws is noted.  Otherwise alignment is normal.  No signs of significant arthritic changes were noted at the level of the tibiotalar joint.     Toni Arthurs, MD     JH/MEDQ  D:  04/21/2012  T:  04/21/2012  Job:  [id number]

## 2012-09-02 ENCOUNTER — Other Ambulatory Visit: Payer: Self-pay | Admitting: Family Medicine

## 2012-09-02 DIAGNOSIS — Z1231 Encounter for screening mammogram for malignant neoplasm of breast: Secondary | ICD-10-CM

## 2012-09-16 ENCOUNTER — Telehealth: Payer: Self-pay

## 2012-09-16 ENCOUNTER — Other Ambulatory Visit: Payer: Self-pay | Admitting: Family Medicine

## 2012-09-16 ENCOUNTER — Ambulatory Visit
Admission: RE | Admit: 2012-09-16 | Discharge: 2012-09-16 | Disposition: A | Discharge status: Home / Self Care | Payer: 59 | Source: Ambulatory Visit | Attending: Family Medicine | Admitting: Family Medicine

## 2012-09-16 ENCOUNTER — Ambulatory Visit (HOSPITAL_COMMUNITY)
Admission: RE | Admit: 2012-09-16 | Discharge: 2012-09-16 | Disposition: A | Discharge status: Home / Self Care | Payer: 59 | Source: Ambulatory Visit | Attending: Family Medicine | Admitting: Family Medicine

## 2012-09-16 ENCOUNTER — Telehealth: Payer: Self-pay | Admitting: *Deleted

## 2012-09-16 DIAGNOSIS — N6320 Unspecified lump in the left breast, unspecified quadrant: Secondary | ICD-10-CM

## 2012-09-16 DIAGNOSIS — N63 Unspecified lump in unspecified breast: Secondary | ICD-10-CM

## 2012-09-16 DIAGNOSIS — Z1231 Encounter for screening mammogram for malignant neoplasm of breast: Secondary | ICD-10-CM

## 2012-09-16 NOTE — Telephone Encounter (Signed)
Orders cancelled.

## 2012-09-16 NOTE — Telephone Encounter (Signed)
Pt left note; went for screening mammogram this morning and pt has a lump 3 o'clock position on lt breast; needs order for diagnostic mammogram to be done today if possible; pt leaves 09/17/12 for 2 week vacation. Pt wants to go to [facility]. Pt request cb ASAP.

## 2012-09-29 ENCOUNTER — Other Ambulatory Visit: Payer: 59

## 2012-10-20 ENCOUNTER — Encounter: Payer: Self-pay | Admitting: Family Medicine

## 2012-10-20 ENCOUNTER — Ambulatory Visit (INDEPENDENT_AMBULATORY_CARE_PROVIDER_SITE_OTHER): Payer: 59 | Admitting: Family Medicine

## 2012-10-20 VITALS — BP 110/82 | HR 62 | Temp 98.2°F | Ht 65.75 in | Wt 272.5 lb

## 2012-10-20 DIAGNOSIS — L259 Unspecified contact dermatitis, unspecified cause: Secondary | ICD-10-CM

## 2012-10-20 DIAGNOSIS — L249 Irritant contact dermatitis, unspecified cause: Secondary | ICD-10-CM

## 2012-10-20 DIAGNOSIS — L0211 Cutaneous abscess of neck: Secondary | ICD-10-CM

## 2012-10-20 MED ORDER — DOXYCYCLINE HYCLATE 100 MG PO TABS: 100.0000 mg | ORAL_TABLET | Freq: Two times a day (BID) | ORAL | Status: DC

## 2012-10-20 MED ORDER — TRIAMCINOLONE ACETONIDE 0.5 % EX CREA: TOPICAL_CREAM | Freq: Two times a day (BID) | CUTANEOUS | Status: DC

## 2012-10-20 NOTE — Progress Notes (Signed)
  Subjective:    Patient ID: Jamie Mckenzie, female    DOB: 1970/07/18, 42 y.o.   MRN: [id number]  HPI  42 year old female pt with history of hypothyroid, GERD presents with new onset of rash on posterior neck x 2-3 weeks. She has always itchy on back of neck. 3 weeks ago she had her hair dyed... Then noted severe itching immediately, she was scratching it  alotand weepy red bumps skin 3-4 days later Lymph nodes are swollen. She does have some yellowish clear discharge from rash. She has now started tgel shampoo and has applied hydrocortisone cream. Spreading down neck and to upper shoulders, also now on nose.   She cannot see Derm until November.  No fever, feels well otherwise.  She works as a Engineer, civil (consulting).   Review of Systems  Constitutional: Negative for fever and fatigue.  HENT: Negative for ear pain.   Eyes: Negative for pain.  Respiratory: Negative for chest tightness and shortness of breath.   Cardiovascular: Negative for chest pain, palpitations and leg swelling.  Gastrointestinal: Negative for abdominal pain.  Genitourinary: Negative for dysuria.       Objective:   Physical Exam  Constitutional: She appears well-developed and well-nourished.  Neck: Normal range of motion. Neck supple. No thyromegaly present.  Cardiovascular: Normal rate and regular rhythm.   Pulmonary/Chest: Effort normal and breath sounds normal.  Skin:  Weepy swollen erythematous patch on right posterior scalp, erythematous  papules with dry flaky skin across neck, shoulders, nose and behind right ear.          Assessment & Plan:

## 2012-10-20 NOTE — Patient Instructions (Addendum)
Stop T gel. Avoid hair dying products that are not hypoallergenic. Apply steroid cream twice daily for 2 weeks.  Complete antibiotic x 10 days.  call if not improving by early next week or earlier if spreading.

## 2012-10-20 NOTE — Assessment & Plan Note (Signed)
Likely initiall irritation was to hair dye and now additionally to t-gel. Stop irritants. Treat with topical steroid cream BID x 2 weeks.

## 2012-10-20 NOTE — Assessment & Plan Note (Signed)
Likely bacterial superinfeciton... 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 Treat with doxycycline.

## 2012-11-09 ENCOUNTER — Telehealth: Payer: Self-pay | Admitting: Family Medicine

## 2012-11-09 DIAGNOSIS — Z13220 Encounter for screening for lipoid disorders: Secondary | ICD-10-CM

## 2012-11-09 DIAGNOSIS — E039 Hypothyroidism, unspecified: Secondary | ICD-10-CM

## 2012-11-09 NOTE — Telephone Encounter (Signed)
Message copied by Excell Seltzer on Wed Nov 09, 2012  8:24 AM ------      Message from: Alvina Chou      Created: Thu Nov 03, 2012 11:32 AM      Regarding: Lab orders for Thursday, 9.18.14       Patient is scheduled for CPX labs, please order future labs, Thanks , Terri       ------

## 2012-11-10 ENCOUNTER — Other Ambulatory Visit (INDEPENDENT_AMBULATORY_CARE_PROVIDER_SITE_OTHER): Payer: 59

## 2012-11-10 DIAGNOSIS — Z13220 Encounter for screening for lipoid disorders: Secondary | ICD-10-CM

## 2012-11-10 DIAGNOSIS — E039 Hypothyroidism, unspecified: Secondary | ICD-10-CM

## 2012-11-10 LAB — COMPREHENSIVE METABOLIC PANEL
AST: 17 U/L (ref 0–37)
Albumin: 4 g/dL (ref 3.5–5.2)
Alkaline Phosphatase: 127 U/L — ABNORMAL HIGH (ref 39–117)
BUN: 17 mg/dL (ref 6–23)
Calcium: 9.3 mg/dL (ref 8.4–10.5)
Chloride: 105 mEq/L (ref 96–112)
Glucose, Bld: 86 mg/dL (ref 70–99)
Potassium: 3.8 mEq/L (ref 3.5–5.1)
Sodium: 137 mEq/L (ref 135–145)
Total Protein: 7.2 g/dL (ref 6.0–8.3)

## 2012-11-10 LAB — LIPID PANEL
LDL Cholesterol: 112 mg/dL — ABNORMAL HIGH (ref 0–99)
Total CHOL/HDL Ratio: 5
Triglycerides: 122 mg/dL (ref 0.0–149.0)

## 2012-11-11 ENCOUNTER — Other Ambulatory Visit: Payer: 59

## 2012-11-18 ENCOUNTER — Encounter: Payer: 59 | Admitting: Family Medicine

## 2012-12-29 ENCOUNTER — Other Ambulatory Visit: Payer: Self-pay

## 2013-01-06 ENCOUNTER — Ambulatory Visit (INDEPENDENT_AMBULATORY_CARE_PROVIDER_SITE_OTHER): Payer: 59 | Admitting: Family Medicine

## 2013-01-06 ENCOUNTER — Encounter: Payer: Self-pay | Admitting: Family Medicine

## 2013-01-06 VITALS — BP 114/72 | HR 74 | Temp 98.2°F | Ht 65.0 in | Wt 274.0 lb

## 2013-01-06 DIAGNOSIS — L408 Other psoriasis: Secondary | ICD-10-CM

## 2013-01-06 DIAGNOSIS — Z0000 Encounter for general adult medical examination without abnormal findings: Secondary | ICD-10-CM

## 2013-01-06 DIAGNOSIS — L409 Psoriasis, unspecified: Secondary | ICD-10-CM

## 2013-01-06 DIAGNOSIS — M79602 Pain in left arm: Secondary | ICD-10-CM | POA: Insufficient documentation

## 2013-01-06 DIAGNOSIS — M79609 Pain in unspecified limb: Secondary | ICD-10-CM

## 2013-01-06 MED ORDER — DICLOFENAC SODIUM 75 MG PO TBEC: 75.0000 mg | DELAYED_RELEASE_TABLET | Freq: Two times a day (BID) | ORAL | Status: DC

## 2013-01-06 NOTE — Patient Instructions (Addendum)
Get back on track with healthy eating, weight loss and exercise.  Quit smoking Start diclofenac for pain instead of OTC meds. Wear tennis elbow strap, ice massage. Start exercises. Review info given. No lifting greater than 10 lbs x at least 1 week. Follow up if not improving as expected.

## 2013-01-06 NOTE — Progress Notes (Signed)
Pre-visit discussion using our clinic review tool. No additional management support is needed unless otherwise documented below in the visit note.  

## 2013-01-06 NOTE — Progress Notes (Signed)
The patient is here for annual wellness exam and preventative care.    Dx with psoriasis. Using topicort spray. Dr. Adolphus Birchwood, [facility].  Pain in left latereral arm x  1 month when she picks things up. Improved some. No numbness. No pain with pronation/ supination. She has been using NSAIDs prn.  Nothing similar in the past.  Hypothyroid: Stable on 25 mcg levothyroxine  Lab Results  Component Value Date   TSH 2.17 11/10/2012   Has been having some irregular menses..  Having every 3-4 months. Feels it may be perimenopause. Mother went through menopause early.   Depression, well controlled On no medication.   Wt Readings from Last 3 Encounters:  01/06/13 274 lb (124.286 kg)  10/20/12 272 lb 8 oz (123.605 kg)  04/21/12 275 lb 8 oz (124.966 kg)  Diet: moderate, low carb  Exercise: limited   Reviewed labs in detail. LDL at goal <160. HDl low. Lab Results  Component Value Date   CHOL 174 11/10/2012   HDL 38.10* 11/10/2012   LDLCALC 112* 11/10/2012   TRIG 122.0 11/10/2012   CHOLHDL 5 11/10/2012    Review of Systems  Constitutional: Negative for fever, fatigue and unexpected weight change.  HENT: Negative for ear pain, congestion, sore throat, sneezing, trouble swallowing and sinus pressure.  Eyes: Negative for pain and itching.  Respiratory: Negative for cough, shortness of breath and wheezing.  Cardiovascular: Negative for chest pain, palpitations and leg swelling.  Gastrointestinal: Negative for nausea, abdominal pain, diarrhea, constipation and blood in stool.  Genitourinary: Negative for dysuria, hematuria, vaginal discharge, difficulty urinating and menstrual problem.  Skin: improved neck rash.  Neurological: Negative for syncope, weakness, light-headedness, numbness and headaches.  Psychiatric/Behavioral: Negative for confusion and dysphoric mood. The patient is not nervous/anxious.  Objective:   Physical Exam  Constitutional: Vital signs are normal. She appears  well-developed and well-nourished. She is cooperative. Non-toxic appearance. She does not appear ill. No distress.  Morbidly obese  HENT:  Head: Normocephalic.  Right Ear: Hearing, tympanic membrane, external ear and ear canal normal.  Left Ear: Hearing, tympanic membrane, external ear and ear canal normal.  Nose: Nose normal.  Eyes: Conjunctivae normal, EOM and lids are normal. Pupils are equal, round, and reactive to light. No foreign bodies found.  Neck: Trachea normal and normal range of motion. Neck supple. Carotid bruit is not present. No mass and no thyromegaly present.  Cardiovascular: Normal rate, regular rhythm, S1 normal, S2 normal, normal heart sounds and intact distal pulses. Exam reveals no gallop.  No murmur heard.  Pulmonary/Chest: Effort normal and breath sounds normal. No respiratory distress. She has no wheezes. She has no rhonchi. She has no rales.  Abdominal: Soft. Normal appearance and bowel sounds are normal. She exhibits no distension, no fluid wave, no abdominal bruit and no mass. There is no hepatosplenomegaly. There is no tenderness. There is no rebound, no guarding and no CVA tenderness. No hernia.  Genitourinary: Vagina normal and uterus normal. No breast swelling, tenderness, discharge or bleeding. Pelvic exam was performed with patient prone. There is no rash, tenderness or lesion on the right labia. There is no rash, tenderness or lesion on the left labia. Uterus is not enlarged and not tender. Cervix exhibits no motion tenderness, no discharge and no friability. Right adnexum displays no mass, no tenderness and no fullness. Left adnexum displays no mass, no tenderness and no fullness.  Musculoskeletal:  Left arm: ttp over lateral epicondyle. strgnth 5/5 in left upper ext. Lymphadenopathy:  She has no cervical adenopathy.  She has no axillary adenopathy.  Neurological: She is alert. She has normal strength. No cranial nerve deficit or sensory deficit.  Skin: Skin is  warm, dry and intact. Improved neck ash noted.  Psychiatric: Her speech is normal and behavior is normal. Judgment normal. Her mood appears not anxious. Cognition and memory are normal. She does not exhibit a depressed mood.  Assessment & Plan:   The patient's preventative maintenance and recommended screening tests for an annual wellness exam were reviewed in full today.  Brought up to date unless services declined.  Counselled on the importance of diet, exercise, and its role in overall health and mortality.  The patient's FH and SH was reviewed, including their home life, tobacco status, and drug and alcohol status.   Vaccines:uptodate offered flu vaccine. Will get at work. Uptodate with tdap.  Mammo: nml 08/2012,   PAP/DVE: nml 2012,nml 2013 Now space to q2 years, yearly DVE.  Smoking still .. 17 pack per day.

## 2013-01-06 NOTE — Assessment & Plan Note (Signed)
Most likely lateral epicondylitis. Treat with stronger NSAIDS, limit lifting.  Wear tennis elbow strap. Follow up prn.

## 2013-01-31 ENCOUNTER — Encounter: Payer: Self-pay | Admitting: Family Medicine

## 2013-01-31 MED ORDER — LEVOTHYROXINE SODIUM 25 MCG PO TABS: 25.0000 ug | ORAL_TABLET | Freq: Every day | ORAL | Status: DC

## 2013-01-31 MED ORDER — OMEPRAZOLE 20 MG PO CPDR: 20.0000 mg | DELAYED_RELEASE_CAPSULE | Freq: Every day | ORAL | Status: DC

## 2013-09-18 ENCOUNTER — Other Ambulatory Visit: Payer: Self-pay | Admitting: Family Medicine

## 2013-09-18 DIAGNOSIS — Z1231 Encounter for screening mammogram for malignant neoplasm of breast: Secondary | ICD-10-CM

## 2013-09-19 ENCOUNTER — Ambulatory Visit (HOSPITAL_COMMUNITY)
Admission: RE | Admit: 2013-09-19 | Discharge: 2013-09-19 | Disposition: A | Discharge status: Home / Self Care | Payer: 59 | Source: Ambulatory Visit | Attending: Family Medicine | Admitting: Family Medicine

## 2013-09-19 DIAGNOSIS — Z1231 Encounter for screening mammogram for malignant neoplasm of breast: Secondary | ICD-10-CM | POA: Insufficient documentation

## 2013-12-16 ENCOUNTER — Encounter (HOSPITAL_COMMUNITY): Payer: Self-pay | Admitting: Emergency Medicine

## 2013-12-16 ENCOUNTER — Emergency Department (HOSPITAL_COMMUNITY)
Admission: EM | Admit: 2013-12-16 | Discharge: 2013-12-17 | Disposition: A | Discharge status: Home / Self Care | Payer: 59 | Attending: Emergency Medicine | Admitting: Emergency Medicine

## 2013-12-16 ENCOUNTER — Emergency Department (HOSPITAL_COMMUNITY): Payer: 59

## 2013-12-16 DIAGNOSIS — R0981 Nasal congestion: Secondary | ICD-10-CM | POA: Diagnosis not present

## 2013-12-16 DIAGNOSIS — R05 Cough: Secondary | ICD-10-CM | POA: Insufficient documentation

## 2013-12-16 DIAGNOSIS — K219 Gastro-esophageal reflux disease without esophagitis: Secondary | ICD-10-CM | POA: Diagnosis not present

## 2013-12-16 DIAGNOSIS — Z8669 Personal history of other diseases of the nervous system and sense organs: Secondary | ICD-10-CM | POA: Insufficient documentation

## 2013-12-16 DIAGNOSIS — Z969 Presence of functional implant, unspecified: Secondary | ICD-10-CM | POA: Diagnosis not present

## 2013-12-16 DIAGNOSIS — R591 Generalized enlarged lymph nodes: Secondary | ICD-10-CM | POA: Diagnosis present

## 2013-12-16 DIAGNOSIS — Z98811 Dental restoration status: Secondary | ICD-10-CM | POA: Insufficient documentation

## 2013-12-16 DIAGNOSIS — E039 Hypothyroidism, unspecified: Secondary | ICD-10-CM | POA: Insufficient documentation

## 2013-12-16 DIAGNOSIS — M542 Cervicalgia: Secondary | ICD-10-CM | POA: Diagnosis not present

## 2013-12-16 DIAGNOSIS — Z8709 Personal history of other diseases of the respiratory system: Secondary | ICD-10-CM | POA: Diagnosis not present

## 2013-12-16 DIAGNOSIS — Z87828 Personal history of other (healed) physical injury and trauma: Secondary | ICD-10-CM | POA: Insufficient documentation

## 2013-12-16 DIAGNOSIS — Z720 Tobacco use: Secondary | ICD-10-CM | POA: Insufficient documentation

## 2013-12-16 DIAGNOSIS — Z8742 Personal history of other diseases of the female genital tract: Secondary | ICD-10-CM | POA: Diagnosis not present

## 2013-12-16 DIAGNOSIS — R000 Tachycardia, unspecified: Secondary | ICD-10-CM | POA: Diagnosis not present

## 2013-12-16 DIAGNOSIS — Z791 Long term (current) use of non-steroidal anti-inflammatories (NSAID): Secondary | ICD-10-CM | POA: Insufficient documentation

## 2013-12-16 DIAGNOSIS — M13879 Other specified arthritis, unspecified ankle and foot: Secondary | ICD-10-CM | POA: Diagnosis not present

## 2013-12-16 DIAGNOSIS — Z79899 Other long term (current) drug therapy: Secondary | ICD-10-CM | POA: Insufficient documentation

## 2013-12-16 DIAGNOSIS — R21 Rash and other nonspecific skin eruption: Secondary | ICD-10-CM | POA: Diagnosis not present

## 2013-12-16 LAB — CBC WITH DIFFERENTIAL/PLATELET
BASOS ABS: 0 10*3/uL (ref 0.0–0.1)
BASOS PCT: 0 % (ref 0–1)
Eosinophils Absolute: 0.2 10*3/uL (ref 0.0–0.7)
Eosinophils Relative: 1 % (ref 0–5)
HEMATOCRIT: 42.2 % (ref 36.0–46.0)
Hemoglobin: 13.9 g/dL (ref 12.0–15.0)
LYMPHS PCT: 17 % (ref 12–46)
Lymphs Abs: 2.4 10*3/uL (ref 0.7–4.0)
MCH: 29 pg (ref 26.0–34.0)
MCHC: 32.9 g/dL (ref 30.0–36.0)
MCV: 87.9 fL (ref 78.0–100.0)
MONO ABS: 0.9 10*3/uL (ref 0.1–1.0)
Monocytes Relative: 7 % (ref 3–12)
NEUTROS ABS: 10.3 10*3/uL — AB (ref 1.7–7.7)
Neutrophils Relative %: 75 % (ref 43–77)
Platelets: 349 10*3/uL (ref 150–400)
RBC: 4.8 MIL/uL (ref 3.87–5.11)
RDW: 13.7 % (ref 11.5–15.5)
WBC: 13.8 10*3/uL — AB (ref 4.0–10.5)

## 2013-12-16 LAB — COMPREHENSIVE METABOLIC PANEL
ALBUMIN: 3.6 g/dL (ref 3.5–5.2)
ALT: 45 U/L — AB (ref 0–35)
AST: 24 U/L (ref 0–37)
Alkaline Phosphatase: 181 U/L — ABNORMAL HIGH (ref 39–117)
Anion gap: 13 (ref 5–15)
BUN: 17 mg/dL (ref 6–23)
CHLORIDE: 100 meq/L (ref 96–112)
CO2: 26 mEq/L (ref 19–32)
CREATININE: 0.81 mg/dL (ref 0.50–1.10)
Calcium: 9.6 mg/dL (ref 8.4–10.5)
GFR calc Af Amer: >90 mL/min (ref >90)
GFR calc non Af Amer: 87 mL/min — ABNORMAL LOW (ref >90)
Glucose, Bld: 95 mg/dL (ref 70–99)
Potassium: 3.9 mEq/L (ref 3.7–5.3)
SODIUM: 139 meq/L (ref 137–147)
Total Bilirubin: 0.3 mg/dL (ref 0.3–1.2)
Total Protein: 7.9 g/dL (ref 6.0–8.3)

## 2013-12-16 MED ORDER — IOHEXOL 300 MG/ML  SOLN
75.0000 mL | Freq: Once | INTRAMUSCULAR | Status: AC | PRN
  Administered 2013-12-16: 75 mL via INTRAVENOUS

## 2013-12-16 NOTE — ED Provider Notes (Signed)
CSN: [id number]     Arrival date & time 12/16/13  2143 History   First MD Initiated Contact with Patient 12/16/13 2153     Chief Complaint  Patient presents with  . Otalgia  . Lymphadenopathy     (Consider location/radiation/quality/duration/timing/severity/associated sxs/prior Treatment) HPI Comments: 43 year old female who presents with right lateral neck pain. She has had a viral URI with cough and congestion. She has also had some right periorbital headache. She has had some fevers earlier in the week but denies them in the last few days. She states that she developed some pain in her right lateral neck earlier today. She had some mild swelling in the right lateral neck throughout the day persisted. She has a tender area on her right lateral neck.  The severity of her issue is mild to moderate. She denies any other symptoms. She is not had similar symptoms previously. Her pain is noted to be mild to moderate as well.  The history is provided by the patient.    Past Medical History  Diagnosis Date  . GERD (gastroesophageal reflux disease)   . Hypothyroidism   . Arthritis     ankles  . Retained orthopedic hardware 03/2012    left ankle  . Stuffy and runny nose     clear drainage from nose - URI onset 03/30/2012  . Cough 04/15/2012  . Abrasion of right wrist 04/15/2012  . Dental crowns present   . Perimenopausal 03/2012    irregular periods   Past Surgical History  Procedure Laterality Date  . Orif ankle fracture Right 1985  . Hysteroscopy w/d&c  10/24/2009    with polypectomy  . Orif ankle fracture  11/03/2011    Procedure: OPEN REDUCTION INTERNAL FIXATION (ORIF) ANKLE FRACTURE;  Surgeon: Wylene Simmer, MD;  Location: [place];  Service: Orthopedics;  Laterality: Left;  Open Reduction Internal Fixation left ankle lateral malleolus fracture, left syndesmosis  . Multiple tooth extractions    . Dilation and evacuation  11/15/2008  . Hardware removal Left 04/21/2012   Procedure: HARDWARE REMOVAL;  Surgeon: Wylene Simmer, MD;  Location: [place];  Service: Orthopedics;  Laterality: Left;  LEFT ANKLE HARDWARE REMOVAL   Family History  Problem Relation Age of Onset  . Hypertension Mother   . Arthritis Mother   . Obesity Mother     morbid  . Myasthenia gravis Mother   . Cancer Father     non hodgkins lymphoma  . Cancer Maternal Grandmother     lung  . Heart disease Maternal Grandfather     heart attack  . COPD Paternal Grandfather    History  Substance Use Topics  . Smoking status: Current Every Day Smoker -- 10 years    Types: Cigarettes  . Smokeless tobacco: Never Used     Comment: 3-4 cig./day  . Alcohol Use: Yes     Comment: socially   OB History   Grav Para Term Preterm Abortions TAB SAB Ect Mult Living                 Review of Systems  Constitutional: Negative for fever and fatigue.  HENT: Positive for congestion. Negative for drooling.        Neck pain  Eyes: Negative for pain.  Respiratory: Positive for cough.   Gastrointestinal: Negative for nausea, vomiting and diarrhea.  Genitourinary: Negative for dysuria and hematuria.  Musculoskeletal: Negative for back pain, gait problem and neck pain.  Skin: Negative for color change.  Neurological: Negative for dizziness.  Hematological: Negative for adenopathy.  Psychiatric/Behavioral: Negative for behavioral problems.  All other systems reviewed and are negative.     Allergies  Review of patient's allergies indicates no known allergies.  Home Medications   Prior to Admission medications   Medication Sig Start Date End Date Taking? Authorizing Provider  Desoximetasone (TOPICORT) 0.25 % LIQD Apply topically as needed.    Historical Provider, MD  diclofenac (VOLTAREN) 75 MG EC tablet Take 1 tablet (75 mg total) by mouth 2 (two) times daily. 01/06/13   Amy Cletis Athens, MD  levothyroxine (SYNTHROID, LEVOTHROID) 25 MCG tablet Take 1 tablet (25 mcg total) by mouth  daily. 01/31/13   Amy Cletis Athens, MD  omeprazole (PRILOSEC) 20 MG capsule Take 1 capsule (20 mg total) by mouth daily. 01/31/13   Amy E Diona Browner, MD   BP 144/92  Pulse 100  Temp(Src) 99.2 F (37.3 C) (Oral)  Resp 20  SpO2 99%  LMP 11/18/2013 Physical Exam  Nursing note and vitals reviewed. Constitutional: She is oriented to person, place, and time. She appears well-developed and well-nourished.  HENT:  Head: Normocephalic.  Mouth/Throat: Oropharynx is clear and moist. No oropharyngeal exudate.  TMs mildly dull bilaterally.  Several macular papular lesions noted on the right upper forehead and right parietal area of the scalp.   Eyes: Conjunctivae and EOM are normal. Pupils are equal, round, and reactive to light.  No ocular lesions noted.   Neck: Normal range of motion. Neck supple.  Normal range of motion of the neck.  Small round mobile mass noted on the right lateral neck, some portion of the mass extends several centimeters slightly down the anterior cervical chain.  Cardiovascular: Regular rhythm, normal heart sounds and intact distal pulses.  Exam reveals no gallop and no friction rub.   No murmur heard. Pulmonary/Chest: Effort normal and breath sounds normal. No respiratory distress. She has no wheezes.  Abdominal: Soft. Bowel sounds are normal. There is no tenderness. There is no rebound and no guarding.  Musculoskeletal: Normal range of motion. She exhibits no edema and no tenderness.  Neurological: She is alert and oriented to person, place, and time. Abnormal reflex: hehe is willing.  Skin: Skin is warm and dry.  Psychiatric: She has a normal mood and affect. Her behavior is normal.    ED Course  Procedures (including critical care time) Labs Review Labs Reviewed  CBC WITH DIFFERENTIAL - Abnormal; Notable for the following:    WBC 13.8 (*)    Neutro Abs 10.3 (*)    All other components within normal limits  COMPREHENSIVE METABOLIC PANEL - Abnormal; Notable for the  following:    ALT 45 (*)    Alkaline Phosphatase 181 (*)    GFR calc non Af Amer 87 (*)    All other components within normal limits    Imaging Review Ct Soft Tissue Neck W Contrast  12/16/2013   CLINICAL DATA:  Patient is been ill for 12 days, treated for ear infection. Increasing pain in the right ear and increased swelling in lymph nodes on the right side of the neck. Previous fever. Difficulty swallowing.  EXAM: CT NECK WITH CONTRAST  TECHNIQUE: Multidetector CT imaging of the neck was performed using the standard protocol following the bolus administration of intravenous contrast.  CONTRAST:  76mL OMNIPAQUE IOHEXOL 300 MG/ML  SOLN  COMPARISON:  None.  FINDINGS: Mucosal thickening in the sphenoid sinuses and retention cyst in the right maxillary antrum. Partial opacification of  right mastoid air cells. Right external and middle ear cavity appears patent. There appears to be mild diffuse swelling of the tonsils bilaterally. No fluid collection to suggest abscess. Visualization is however limited due to streak artifact from dental hardware. Numerous lymph nodes throughout the right and left cervical chains without pathologic enlargement. These are likely reactive. Intra parotid lymph nodes bilaterally. Cervical carotid and jugular vessels appear patent. No significant mass lesion or infiltration. Thyroid gland is not enlarged. Visualized airways appear patent.  IMPRESSION: Bilateral tonsillar swelling suggesting infection/inflammation. No abscess identified. Bilateral cervical lymph nodes without pathologic enlargement, likely reactive.   Electronically Signed   By: Lucienne Capers M.D.   On: 12/16/2013 23:48     EKG Interpretation None      MDM   Final diagnoses:  Neck pain on right side  Facial rash    10:41 PM 43 y.o. female who presents with right lateral neck pain. She states that she developed right ear pain and right frontal headache approximately 1.5 weeks ago. She was diagnosed  with right otitis media several days ago and has been on amoxicillin since then. She states that she did have fevers earlier in the week but denies them in the last few days. She developed some swelling on the right lateral neck which is tender to palpation today and presents now for evaluation of this. She is also noted to have several maculopapular lesions on her right forehead and right scalp which is likely consistent with shingles. She is afebrile and mildly tachycardic here. She declined any pain medicine. No difficulty swallowing or handling secretions. No sore throat currently.   The patient states that she developed some itching on her right forehead and scalp 5-6 days ago which then turned into a mild rash 3-4 days ago. While she does not have significant pain it is pruritic and suspicious for shingles. Will tx empirically.   In my physical exam there is a statement saying: abnormal reflex: hehe is willing. I do not know how this statement got into the note and I cannot remove it. Please disregard this text.   CT non-contrib. Likely reactive lymphnodes. Pt continues to appear well. Will rec f/u as needed. Return for any worsening.    Pamella Pert, MD 12/17/13 1057

## 2013-12-16 NOTE — ED Notes (Signed)
MD at bedside. 

## 2013-12-16 NOTE — ED Notes (Signed)
Pt reports she has been ill for 12 days, pt reports she was treated for an ear infection and started on amoxicillin on Tuesday of this week. Pt reports increased pain in her right ear and increased swelling in her lymph nodes on the right side of her neck. Pt states she does not believe she has had a fever in the past 24 hours however she does report she had a fever earlier in the week. Pt denies problems swallowing.

## 2013-12-17 MED ORDER — ACYCLOVIR 400 MG PO TABS: 800.0000 mg | ORAL_TABLET | Freq: Every day | ORAL | Status: AC

## 2014-04-13 ENCOUNTER — Other Ambulatory Visit: Payer: Self-pay

## 2014-04-13 MED ORDER — LEVOTHYROXINE SODIUM 25 MCG PO TABS: 25.0000 ug | ORAL_TABLET | Freq: Every day | ORAL | Status: DC

## 2014-04-13 MED ORDER — OMEPRAZOLE 20 MG PO CPDR: 20.0000 mg | DELAYED_RELEASE_CAPSULE | Freq: Every day | ORAL | Status: DC

## 2014-04-13 NOTE — Telephone Encounter (Signed)
Pt left v/m requesting refill levothyroxine and omeprazole to [facility] outpt pharmacy;pt last seen 01/06/13 but has CPX 05/29/14.Please advise.pt request cb if refills cannot be done.

## 2014-05-21 ENCOUNTER — Telehealth: Payer: Self-pay | Admitting: Family Medicine

## 2014-05-21 DIAGNOSIS — Z13220 Encounter for screening for lipoid disorders: Secondary | ICD-10-CM

## 2014-05-21 DIAGNOSIS — E039 Hypothyroidism, unspecified: Secondary | ICD-10-CM

## 2014-05-21 NOTE — Telephone Encounter (Signed)
-----   Message from Ellamae Sia sent at 05/21/2014  2:26 PM EDT ----- Regarding: Lab orders for Tuesday, 3.29.16 Patient is scheduled for CPX labs, please order future labs, Thanks , Karna Christmas

## 2014-05-22 ENCOUNTER — Other Ambulatory Visit (INDEPENDENT_AMBULATORY_CARE_PROVIDER_SITE_OTHER): Payer: 59

## 2014-05-22 DIAGNOSIS — Z13220 Encounter for screening for lipoid disorders: Secondary | ICD-10-CM

## 2014-05-22 DIAGNOSIS — E039 Hypothyroidism, unspecified: Secondary | ICD-10-CM | POA: Diagnosis not present

## 2014-05-22 LAB — COMPREHENSIVE METABOLIC PANEL
ALT: 25 U/L (ref 0–35)
AST: 15 U/L (ref 0–37)
Albumin: 4.2 g/dL (ref 3.5–5.2)
Alkaline Phosphatase: 138 U/L — ABNORMAL HIGH (ref 39–117)
BILIRUBIN TOTAL: 0.6 mg/dL (ref 0.2–1.2)
BUN: 23 mg/dL (ref 6–23)
CHLORIDE: 102 meq/L (ref 96–112)
CO2: 26 mEq/L (ref 19–32)
CREATININE: 0.84 mg/dL (ref 0.40–1.20)
Calcium: 10 mg/dL (ref 8.4–10.5)
GFR: 77.92 mL/min (ref >60.00)
GLUCOSE: 85 mg/dL (ref 70–99)
Potassium: 4 mEq/L (ref 3.5–5.1)
SODIUM: 136 meq/L (ref 135–145)
Total Protein: 7.7 g/dL (ref 6.0–8.3)

## 2014-05-22 LAB — LIPID PANEL
CHOLESTEROL: 167 mg/dL (ref 0–200)
HDL: 43.4 mg/dL (ref >39.00)
LDL Cholesterol: 101 mg/dL — ABNORMAL HIGH (ref 0–99)
NonHDL: 123.6
Total CHOL/HDL Ratio: 4
Triglycerides: 113 mg/dL (ref 0.0–149.0)
VLDL: 22.6 mg/dL (ref 0.0–40.0)

## 2014-05-22 LAB — TSH: TSH: 3.45 u[IU]/mL (ref 0.35–4.50)

## 2014-05-29 ENCOUNTER — Other Ambulatory Visit (HOSPITAL_COMMUNITY)
Admission: RE | Admit: 2014-05-29 | Discharge: 2014-05-29 | Disposition: A | Discharge status: Home / Self Care | Payer: 59 | Source: Ambulatory Visit | Attending: Family Medicine | Admitting: Family Medicine

## 2014-05-29 ENCOUNTER — Encounter: Payer: Self-pay | Admitting: Family Medicine

## 2014-05-29 ENCOUNTER — Telehealth: Payer: Self-pay | Admitting: Family Medicine

## 2014-05-29 ENCOUNTER — Ambulatory Visit (INDEPENDENT_AMBULATORY_CARE_PROVIDER_SITE_OTHER): Payer: 59 | Admitting: Family Medicine

## 2014-05-29 VITALS — BP 132/90 | HR 89 | Temp 98.3°F | Ht 64.5 in | Wt 284.2 lb

## 2014-05-29 DIAGNOSIS — Z01419 Encounter for gynecological examination (general) (routine) without abnormal findings: Secondary | ICD-10-CM | POA: Diagnosis not present

## 2014-05-29 DIAGNOSIS — Z0000 Encounter for general adult medical examination without abnormal findings: Secondary | ICD-10-CM

## 2014-05-29 DIAGNOSIS — Z124 Encounter for screening for malignant neoplasm of cervix: Secondary | ICD-10-CM | POA: Diagnosis not present

## 2014-05-29 DIAGNOSIS — Z1151 Encounter for screening for human papillomavirus (HPV): Secondary | ICD-10-CM | POA: Diagnosis present

## 2014-05-29 NOTE — Patient Instructions (Addendum)
Get on track with exercise and healthy eating. Work on weight loss. Call if interested in quitting smoking.

## 2014-05-29 NOTE — Addendum Note (Signed)
Addended by: Carter Kitten on: 05/29/2014 02:44 PM   Modules accepted: Orders

## 2014-05-29 NOTE — Telephone Encounter (Signed)
emmi emailed °

## 2014-05-29 NOTE — Progress Notes (Signed)
Pre visit review using our clinic review tool, if applicable. No additional management support is needed unless otherwise documented below in the visit note. 

## 2014-05-29 NOTE — Progress Notes (Signed)
The patient is here for annual wellness exam.  Hypothyroid,well controlled on current dose levo. Lab Results  Component Value Date   TSH 3.45 05/22/2014   Has been having some irregular menses.. Having every 3-4 months. Feels it may be perimenopause. Mother went through menopause early.  No severe abdominal pain.  Depression, well controlled. She has had some decrease motivation. She is now on night shift, sleeping well. On no medication.   Wt Readings from Last 3 Encounters:  05/29/14 284 lb 4 oz (128.935 kg)  01/06/13 274 lb (124.286 kg)  10/20/12 272 lb 8 oz (123.605 kg)   Reviewed cholesterol in detail.  LDL at goal < 130. Lab Results  Component Value Date   CHOL 167 05/22/2014   HDL 43.40 05/22/2014   LDLCALC 101* 05/22/2014   TRIG 113.0 05/22/2014   CHOLHDL 4 05/22/2014  Diet: moderate, low carb  Exercise: limited    Review of Systems  Constitutional: Negative for fever, fatigue and unexpected weight change.  HENT: Negative for ear pain, congestion, sore throat, sneezing, trouble swallowing and sinus pressure.  Eyes: Negative for pain and itching.  Respiratory: Negative for cough, shortness of breath and wheezing.  Cardiovascular: Negative for chest pain, palpitations and leg swelling.  Gastrointestinal: Negative for nausea, abdominal pain, diarrhea, constipation and blood in stool.  Genitourinary: Negative for dysuria, hematuria, vaginal discharge, difficulty urinating and menstrual problem.  Skin: improved neck rash.  Neurological: Negative for syncope, weakness, light-headedness, numbness and headaches.  Psychiatric/Behavioral: Negative for confusion and dysphoric mood. The patient is not nervous/anxious.  Objective:   Physical Exam  Constitutional: Vital signs are normal. She appears well-developed and well-nourished. She is cooperative. Non-toxic appearance. She does not appear ill. No distress.  Morbidly obese  HENT:  Head: Normocephalic.   Right Ear: Hearing, tympanic membrane, external ear and ear canal normal.  Left Ear: Hearing, tympanic membrane, external ear and ear canal normal.  Nose: Nose normal.  Eyes: Conjunctivae normal, EOM and lids are normal. Pupils are equal, round, and reactive to light. No foreign bodies found.  Neck: Trachea normal and normal range of motion. Neck supple. Carotid bruit is not present. No mass and no thyromegaly present.  Cardiovascular: Normal rate, regular rhythm, S1 normal, S2 normal, normal heart sounds and intact distal pulses. Exam reveals no gallop.  No murmur heard.  Pulmonary/Chest: Effort normal and breath sounds normal. No respiratory distress. She has no wheezes. She has no rhonchi. She has no rales.  Abdominal: Soft. Normal appearance and bowel sounds are normal. She exhibits no distension, no fluid wave, no abdominal bruit and no mass. There is no hepatosplenomegaly. There is no tenderness. There is no rebound, no guarding and no CVA tenderness. No hernia.  Genitourinary: Vagina normal and uterus normal. No breast swelling, tenderness, discharge or bleeding. Pelvic exam was performed with patient prone. There is no rash, tenderness or lesion on the right labia. There is no rash, tenderness or lesion on the left labia. Uterus is not enlarged and not tender. Cervix exhibits no motion tenderness, no discharge and no friability. Right adnexum displays no mass, no tenderness and no fullness. Left adnexum displays no mass, no tenderness and no fullness.  Musculoskeletal: Left arm: ttp over lateral epicondyle. strgnth 5/5 in left upper ext. Lymphadenopathy:  She has no cervical adenopathy.  She has no axillary adenopathy.  Neurological: She is alert. She has normal strength. No cranial nerve deficit or sensory deficit.  Skin: Skin is warm, dry and intact. Improved neck  ash noted.  Psychiatric: Her speech is normal and behavior is normal. Judgment normal. Her mood appears not anxious.  Cognition and memory are normal. She does not exhibit a depressed mood.  Assessment & Plan:   The patient's preventative maintenance and recommended screening tests for an annual wellness exam were reviewed in full today.  Brought up to date unless services declined.  Counselled on the importance of diet, exercise, and its role in overall health and mortality.  The patient's FH and SH was reviewed, including their home life, tobacco status, and drug and alcohol status.   Vaccines: Uptodate with tdap.  Gets flu at work. Mammo: nml 08/2013,  PAP/DVE: nml 2012,nml 2013 Space to q3 years, due this year, yearly DVE.  Smoking still ..5 cig per day. 10 pack year history pre-contemplative.  refused HIV/STD testing.

## 2014-05-30 LAB — CYTOLOGY - PAP

## 2014-05-31 ENCOUNTER — Encounter: Payer: Self-pay | Admitting: *Deleted

## 2014-08-12 ENCOUNTER — Encounter (HOSPITAL_COMMUNITY): Payer: Self-pay | Admitting: Emergency Medicine

## 2014-08-12 ENCOUNTER — Emergency Department (HOSPITAL_COMMUNITY)
Admission: EM | Admit: 2014-08-12 | Discharge: 2014-08-12 | Disposition: A | Discharge status: Home / Self Care | Payer: 59 | Attending: Emergency Medicine | Admitting: Emergency Medicine

## 2014-08-12 DIAGNOSIS — Z8739 Personal history of other diseases of the musculoskeletal system and connective tissue: Secondary | ICD-10-CM | POA: Diagnosis not present

## 2014-08-12 DIAGNOSIS — Z969 Presence of functional implant, unspecified: Secondary | ICD-10-CM | POA: Diagnosis not present

## 2014-08-12 DIAGNOSIS — K219 Gastro-esophageal reflux disease without esophagitis: Secondary | ICD-10-CM | POA: Diagnosis not present

## 2014-08-12 DIAGNOSIS — J029 Acute pharyngitis, unspecified: Secondary | ICD-10-CM | POA: Diagnosis present

## 2014-08-12 DIAGNOSIS — Z87828 Personal history of other (healed) physical injury and trauma: Secondary | ICD-10-CM | POA: Insufficient documentation

## 2014-08-12 DIAGNOSIS — E039 Hypothyroidism, unspecified: Secondary | ICD-10-CM | POA: Insufficient documentation

## 2014-08-12 DIAGNOSIS — Z8709 Personal history of other diseases of the respiratory system: Secondary | ICD-10-CM | POA: Insufficient documentation

## 2014-08-12 DIAGNOSIS — K122 Cellulitis and abscess of mouth: Secondary | ICD-10-CM | POA: Diagnosis not present

## 2014-08-12 DIAGNOSIS — Z8742 Personal history of other diseases of the female genital tract: Secondary | ICD-10-CM | POA: Diagnosis not present

## 2014-08-12 DIAGNOSIS — Z720 Tobacco use: Secondary | ICD-10-CM | POA: Insufficient documentation

## 2014-08-12 LAB — RAPID STREP SCREEN (MED CTR MEBANE ONLY): STREPTOCOCCUS, GROUP A SCREEN (DIRECT): NEGATIVE

## 2014-08-12 NOTE — Discharge Instructions (Signed)
Pharyngitis Pharyngitis is redness, pain, and swelling (inflammation) of your pharynx.  CAUSES  Pharyngitis is usually caused by infection. Most of the time, these infections are from viruses (viral) and are part of a cold. However, sometimes pharyngitis is caused by bacteria (bacterial). Pharyngitis can also be caused by allergies. Viral pharyngitis may be spread from person to person by coughing, sneezing, and personal items or utensils (cups, forks, spoons, toothbrushes). Bacterial pharyngitis may be spread from person to person by more intimate contact, such as kissing.  SIGNS AND SYMPTOMS  Symptoms of pharyngitis include:   Sore throat.   Tiredness (fatigue).   Low-grade fever.   Headache.  Joint pain and muscle aches.  Skin rashes.  Swollen lymph nodes.  Plaque-like film on throat or tonsils (often seen with bacterial pharyngitis). DIAGNOSIS  Your health care provider will ask you questions about your illness and your symptoms. Your medical history, along with a physical exam, is often all that is needed to diagnose pharyngitis. Sometimes, a rapid strep test is done. Other lab tests may also be done, depending on the suspected cause.  TREATMENT  Viral pharyngitis will usually get better in 3-4 days without the use of medicine. Bacterial pharyngitis is treated with medicines that kill germs (antibiotics).  HOME CARE INSTRUCTIONS   Drink enough water and fluids to keep your urine clear or pale yellow.   Only take over-the-counter or prescription medicines as directed by your health care provider:   If you are prescribed antibiotics, make sure you finish them even if you start to feel better.   Do not take aspirin.   Get lots of rest.   Gargle with 8 oz of salt water ( tsp of salt per 1 qt of water) as often as every 1-2 hours to soothe your throat.   Throat lozenges (if you are not at risk for choking) or sprays may be used to soothe your throat. SEEK MEDICAL  CARE IF:   You have large, tender lumps in your neck.  You have a rash.  You cough up green, yellow-brown, or bloody spit. SEEK IMMEDIATE MEDICAL CARE IF:   Your neck becomes stiff.  You drool or are unable to swallow liquids.  You vomit or are unable to keep medicines or liquids down.  You have severe pain that does not go away with the use of recommended medicines.  You have trouble breathing (not caused by a stuffy nose). MAKE SURE YOU:   Understand these instructions.  Will watch your condition.  Will get help right away if you are not doing well or get worse. Document Released: 02/09/2005 Document Revised: 11/30/2012 Document Reviewed: 10/17/2012 [facility] Patient Information 2015 [place], [place]. This information is not intended to replace advice given to you by your health care provider. Make sure you discuss any questions you have with your health care provider. Uvulitis Uvulitis is redness and soreness (inflammation) of the uvula. The uvula is the small tongue-shaped piece of tissue in the back of your mouth.  CAUSES Infection is a common cause of uvulitis. Infection of the uvula can be either viral or bacterial. Infectious uvulitis usually only occurs in association with another condition, such as inflammation and infection of the mouth or throat. Other causes of uvulitis include:  Trauma to the uvula.  Swelling from excess fluid buildup (edema), which may be an allergic reaction.  Inhalation of irritants, such as chemical agents, smoke, or steam. DIAGNOSIS Your caregiver can usually diagnose uvulitis through a physical examination. Bacterial  uvulitis can be diagnosed through the results of the growth of samples of bodily substances taken from your mouth (cultures). HOME CARE INSTRUCTIONS   Rest as much as possible.  Young children may suck on frozen juice bars or frozen ice pops. Older children and adults may gargle with a warm or cold liquid to help soothe the  throat. (Mix  tsp of salt in 8 oz of water, or use strong tea.)  Use a cool-mist humidifier to lessen throat irritation and cough.  Drink enough fluids to keep your urine clear or pale yellow.  While the throat is very sore, eat soft or liquid foods such as milk, ice cream, soups, or milk drinks.  Family members who develop a sore throat or fever should have a medical exam or throat culture.  If your child has uvulitis and is taking antibiotic medicine, wait 24 hours or until his or her temperature is near normal (less than 100 F [37.8 C]) before allowing him or her to return to school or day care.  Only take over-the-counter or prescription medicines for pain, discomfort, or fever as directed by your caregiver. Ask when your test results will be ready. Make sure you get your test results. SEEK MEDICAL CARE IF:   You have an oral temperature above 102 F (38.9 C).  You develop large, tender lumps your the neck.  Your child develops a rash.  You cough up green, yellow-brown, or bloody substances. SEEK IMMEDIATE MEDICAL CARE IF:   You develop any new symptoms, such as vomiting, earache, severe headache, stiff neck, chest pain, or trouble breathing or swallowing.  Your airway is blocked.  You develop more severe throat pain along with drooling or voice changes. Document Released: 09/20/2003 Document Revised: 05/04/2011 Document Reviewed: 04/17/2010 [facility] Patient Information 2015 [place], [place]. This information is not intended to replace advice given to you by your health care provider. Make sure you discuss any questions you have with your health care provider.

## 2014-08-12 NOTE — ED Provider Notes (Signed)
CSN: [id number]     Arrival date & time 08/12/14  1324 History  This chart was scribed for Caryl Ada, PA-C, working with Jola Schmidt, MD by Steva Colder, ED Scribe. The patient was seen in room TR06C/TR06C at 2:04 PM.   Chief Complaint  Patient presents with  . Sore Throat      The history is provided by the patient. No language interpreter was used.    Jamie Mckenzie is a 44 y.o. female who presents to the Emergency Department complaining of sore throat onset this morning PTA. She reports that she woke up this morning with swelling to the back of the throat. Pt [place] from the beach and she denies allergies to any medications. Denies any new medications. Pt is having associated symptoms of difficulty swallowing. She notes that she has tried 800 mg ibuprofen with relief of her symptoms. She denies fever, chills, and any other symptoms. Denies sick contacts.    Past Medical History  Diagnosis Date  . GERD (gastroesophageal reflux disease)   . Hypothyroidism   . Arthritis     ankles  . Retained orthopedic hardware 03/2012    left ankle  . Stuffy and runny nose     clear drainage from nose - URI onset 03/30/2012  . Cough 04/15/2012  . Abrasion of right wrist 04/15/2012  . Dental crowns present   . Perimenopausal 03/2012    irregular periods   Past Surgical History  Procedure Laterality Date  . Orif ankle fracture Right 1985  . Hysteroscopy w/d&c  10/24/2009    with polypectomy  . Orif ankle fracture  11/03/2011    Procedure: OPEN REDUCTION INTERNAL FIXATION (ORIF) ANKLE FRACTURE;  Surgeon: Wylene Simmer, MD;  Location: [facility];  Service: Orthopedics;  Laterality: Left;  Open Reduction Internal Fixation left ankle lateral malleolus fracture, left syndesmosis  . Multiple tooth extractions    . Dilation and evacuation  11/15/2008  . Hardware removal Left 04/21/2012    Procedure: HARDWARE REMOVAL;  Surgeon: Wylene Simmer, MD;  Location: [place];  Service: Orthopedics;  Laterality: Left;  LEFT ANKLE HARDWARE REMOVAL   Family History  Problem Relation Age of Onset  . Hypertension Mother   . Arthritis Mother   . Obesity Mother     morbid  . Myasthenia gravis Mother   . Cancer Father     non hodgkins lymphoma  . Cancer Maternal Grandmother     lung  . Heart disease Maternal Grandfather     heart attack  . COPD Paternal Grandfather    History  Substance Use Topics  . Smoking status: Current Every Day Smoker -- 10 years    Types: Cigarettes  . Smokeless tobacco: Never Used     Comment: 3-4 cig./day  . Alcohol Use: Yes     Comment: socially   OB History    No data available     Review of Systems  Constitutional: Negative for fever and chills.  HENT: Positive for trouble swallowing (slightly resolved).       Allergies  Review of patient's allergies indicates no known allergies.  Home Medications   Prior to Admission medications   Medication Sig Start Date End Date Taking? Authorizing Provider  levothyroxine (SYNTHROID, LEVOTHROID) 25 MCG tablet Take 1 tablet (25 mcg total) by mouth daily. 04/13/14   Amy Cletis [place], MD  omeprazole (PRILOSEC) 20 MG capsule Take 1 capsule (20 mg total) by mouth daily. 04/13/14  Amy E Diona Browner, MD   BP 153/96 mmHg  Pulse 89  Temp(Src) 97.9 F (36.6 C)  Resp 18  Ht 5\' 5"  (1.651 m)  Wt 280 lb (127.007 kg)  BMI 46.59 kg/m2  SpO2 98%  LMP 07/21/2014 (Exact Date) Physical Exam  Constitutional: She is oriented to person, place, and time. She appears well-developed and well-nourished. No distress.  HENT:  Head: Normocephalic and atraumatic.  Eyes: EOM are normal.  Neck: Neck supple. No tracheal deviation present.  Cardiovascular: Normal rate.   Pulmonary/Chest: Effort normal. No respiratory distress.  Musculoskeletal: Normal range of motion.  Neurological: She is alert and oriented to person, place, and time.  Skin: Skin is warm and dry.  Psychiatric: She has a normal  mood and affect. Her behavior is normal.  Nursing note and vitals reviewed.   ED Course  Procedures (including critical care time) DIAGNOSTIC STUDIES: Oxygen Saturation is 98% on RA, nl by my interpretation.    COORDINATION OF CARE: 2:08 PM-Discussed treatment plan which includes ibuprofen, tylenol, rest, with pt at bedside and pt agreed to plan.   Labs Review Labs Reviewed  RAPID STREP SCREEN (NOT AT [facility])  CULTURE, GROUP A STREP    Imaging Review No results found.   EKG Interpretation None      MDM  Pt is ED RN Gretta Cool)  Strep is negative.  I advised symptomatic treatment.  Pt advised to continue ibuprofen   Final diagnoses:  Uvulitis      I personally performed the services in this documentation, which was scribed in my presence.  The recorded information has been reviewed and considered.   Ronnald Collum.  Hollace Kinnier [place], PA-C 08/12/14 [place], MD 08/12/14 [phone]

## 2014-08-12 NOTE — ED Notes (Signed)
Pt reports that she woke up with swelling to back of throat this morning. Pt reports some difficulty swallowing, pt took 800 mg of ibuprofen this morning.

## 2014-08-16 LAB — CULTURE, GROUP A STREP

## 2014-09-24 ENCOUNTER — Other Ambulatory Visit: Payer: Self-pay | Admitting: Family Medicine

## 2014-09-24 DIAGNOSIS — Z1231 Encounter for screening mammogram for malignant neoplasm of breast: Secondary | ICD-10-CM

## 2014-10-04 ENCOUNTER — Ambulatory Visit (HOSPITAL_COMMUNITY)
Admission: RE | Admit: 2014-10-04 | Discharge: 2014-10-04 | Disposition: A | Discharge status: Home / Self Care | Payer: 59 | Source: Ambulatory Visit | Attending: Family Medicine | Admitting: Family Medicine

## 2014-10-04 DIAGNOSIS — Z1231 Encounter for screening mammogram for malignant neoplasm of breast: Secondary | ICD-10-CM | POA: Insufficient documentation

## 2014-10-05 ENCOUNTER — Other Ambulatory Visit: Payer: Self-pay | Admitting: Family Medicine

## 2014-10-05 DIAGNOSIS — R928 Other abnormal and inconclusive findings on diagnostic imaging of breast: Secondary | ICD-10-CM

## 2014-10-09 ENCOUNTER — Other Ambulatory Visit: Payer: 59

## 2014-10-10 ENCOUNTER — Other Ambulatory Visit: Payer: Self-pay | Admitting: Family Medicine

## 2014-10-10 ENCOUNTER — Ambulatory Visit
Admission: RE | Admit: 2014-10-10 | Discharge: 2014-10-10 | Disposition: A | Discharge status: Home / Self Care | Payer: 59 | Source: Ambulatory Visit | Attending: Family Medicine | Admitting: Family Medicine

## 2014-10-10 DIAGNOSIS — R928 Other abnormal and inconclusive findings on diagnostic imaging of breast: Secondary | ICD-10-CM

## 2014-10-17 ENCOUNTER — Inpatient Hospital Stay: Admission: RE | Admit: 2014-10-17 | Payer: 59 | Source: Ambulatory Visit

## 2014-10-23 ENCOUNTER — Ambulatory Visit
Admission: RE | Admit: 2014-10-23 | Discharge: 2014-10-23 | Disposition: A | Discharge status: Home / Self Care | Payer: 59 | Source: Ambulatory Visit | Attending: Family Medicine | Admitting: Family Medicine

## 2014-10-23 ENCOUNTER — Other Ambulatory Visit: Payer: Self-pay | Admitting: Family Medicine

## 2014-10-23 DIAGNOSIS — R928 Other abnormal and inconclusive findings on diagnostic imaging of breast: Secondary | ICD-10-CM

## 2014-10-23 HISTORY — PX: BREAST BIOPSY: SHX20

## 2014-12-14 ENCOUNTER — Other Ambulatory Visit: Payer: Self-pay | Admitting: Family Medicine

## 2014-12-15 MED ORDER — LEVOTHYROXINE SODIUM 25 MCG PO TABS: 25.0000 ug | ORAL_TABLET | Freq: Every day | ORAL | Status: DC

## 2014-12-15 MED ORDER — OMEPRAZOLE 20 MG PO CPDR
20.0000 mg | DELAYED_RELEASE_CAPSULE | Freq: Every day | ORAL | Status: DC
Start: 2014-12-15 — End: 2016-05-21

## 2015-09-17 ENCOUNTER — Other Ambulatory Visit: Payer: Self-pay | Admitting: Family Medicine

## 2015-09-17 DIAGNOSIS — Z1231 Encounter for screening mammogram for malignant neoplasm of breast: Secondary | ICD-10-CM

## 2015-10-03 ENCOUNTER — Other Ambulatory Visit (INDEPENDENT_AMBULATORY_CARE_PROVIDER_SITE_OTHER): Payer: 59

## 2015-10-03 ENCOUNTER — Telehealth: Payer: Self-pay | Admitting: Family Medicine

## 2015-10-03 DIAGNOSIS — E039 Hypothyroidism, unspecified: Secondary | ICD-10-CM | POA: Diagnosis not present

## 2015-10-03 DIAGNOSIS — Z13220 Encounter for screening for lipoid disorders: Secondary | ICD-10-CM

## 2015-10-03 LAB — COMPREHENSIVE METABOLIC PANEL
ALT: 16 U/L (ref 0–35)
AST: 14 U/L (ref 0–37)
Albumin: 4.3 g/dL (ref 3.5–5.2)
Alkaline Phosphatase: 142 U/L — ABNORMAL HIGH (ref 39–117)
BUN: 15 mg/dL (ref 6–23)
CALCIUM: 10 mg/dL (ref 8.4–10.5)
CHLORIDE: 102 meq/L (ref 96–112)
CO2: 30 mEq/L (ref 19–32)
CREATININE: 0.89 mg/dL (ref 0.40–1.20)
GFR: 72.45 mL/min (ref >60.00)
Glucose, Bld: 85 mg/dL (ref 70–99)
POTASSIUM: 4.2 meq/L (ref 3.5–5.1)
SODIUM: 138 meq/L (ref 135–145)
TOTAL PROTEIN: 7.5 g/dL (ref 6.0–8.3)
Total Bilirubin: 0.8 mg/dL (ref 0.2–1.2)

## 2015-10-03 LAB — LIPID PANEL
CHOLESTEROL: 170 mg/dL (ref 0–200)
HDL: 43.2 mg/dL (ref >39.00)
LDL Cholesterol: 105 mg/dL — ABNORMAL HIGH (ref 0–99)
NonHDL: 127.08
Total CHOL/HDL Ratio: 4
Triglycerides: 111 mg/dL (ref 0.0–149.0)
VLDL: 22.2 mg/dL (ref 0.0–40.0)

## 2015-10-03 LAB — TSH: TSH: 3.47 u[IU]/mL (ref 0.35–4.50)

## 2015-10-03 LAB — T4, FREE: FREE T4: 0.7 ng/dL (ref 0.60–1.60)

## 2015-10-03 LAB — T3, FREE: T3, Free: 2.7 pg/mL (ref 2.3–4.2)

## 2015-10-03 NOTE — Telephone Encounter (Signed)
-----   Message from Ellamae Sia sent at 09/27/2015  9:17 AM EDT ----- Regarding: Lab orders for Thursday, 8.10.17 Patient is scheduled for CPX labs, please order future labs, Thanks , Karna Christmas

## 2015-10-07 ENCOUNTER — Ambulatory Visit
Admission: RE | Admit: 2015-10-07 | Discharge: 2015-10-07 | Disposition: A | Discharge status: Home / Self Care | Payer: 59 | Source: Ambulatory Visit | Attending: Family Medicine | Admitting: Family Medicine

## 2015-10-07 DIAGNOSIS — Z1231 Encounter for screening mammogram for malignant neoplasm of breast: Secondary | ICD-10-CM

## 2015-10-08 ENCOUNTER — Encounter: Payer: Self-pay | Admitting: Family Medicine

## 2015-10-08 ENCOUNTER — Ambulatory Visit (INDEPENDENT_AMBULATORY_CARE_PROVIDER_SITE_OTHER): Payer: 59 | Admitting: Family Medicine

## 2015-10-08 VITALS — BP 110/90 | HR 77 | Ht 65.0 in | Wt 275.0 lb

## 2015-10-08 DIAGNOSIS — E039 Hypothyroidism, unspecified: Secondary | ICD-10-CM | POA: Diagnosis not present

## 2015-10-08 DIAGNOSIS — Z0000 Encounter for general adult medical examination without abnormal findings: Secondary | ICD-10-CM

## 2015-10-08 DIAGNOSIS — F325 Major depressive disorder, single episode, in full remission: Secondary | ICD-10-CM | POA: Diagnosis not present

## 2015-10-08 NOTE — Assessment & Plan Note (Signed)
Well controlled. Continue current medication.  

## 2015-10-08 NOTE — Progress Notes (Signed)
The patient is here for annual wellness exam.  Hypothyroid,well controlled on current dose levo. Lab Results  Component Value Date   TSH 3.47 10/03/2015    Had gotten up to 300 lbs. Since 02/2015.Marland KitchenShe has lost 25 lbs in last year. She has been doing weight watchers. Wt Readings from Last 3 Encounters:  10/08/15 275 lb (124.7 kg)  08/12/14 280 lb (127 kg)  05/29/14 284 lb 4 oz (128.9 kg)   Body mass index is 45.76 kg/m.   Diet: moderate, low carb  Exercise: limited, plan to start Yoga.  Depression, well controlled. She has had some decrease motivation. She is now on night shift, sleeping well. On no medication.   Reviewed cholesterol in detail.  LDL at goal < 130. Lab Results  Component Value Date   CHOL 170 10/03/2015   HDL 43.20 10/03/2015   LDLCALC 105 (H) 10/03/2015   TRIG 111.0 10/03/2015   CHOLHDL 4 10/03/2015    Social History /Family History/Past Medical History reviewed and updated if needed.   Review of Systems  Constitutional: Negative for fever, fatigue and unexpected weight change.  HENT: Negative for ear pain, congestion, sore throat, sneezing, trouble swallowing and sinus pressure.  Eyes: Negative for pain and itching.  Respiratory: Negative for cough, shortness of breath and wheezing.  Cardiovascular: Negative for chest pain, palpitations and leg swelling.  Gastrointestinal: Negative for nausea, abdominal pain, diarrhea, constipation and blood in stool.  Genitourinary: Negative for dysuria, hematuria, vaginal discharge, difficulty urinating and menstrual problem.  Skin: improved neck rash.  Neurological: Negative for syncope, weakness, light-headedness, numbness and headaches.  Psychiatric/Behavioral: Negative for confusion and dysphoric mood. The patient is not nervous/anxious.  Objective:   Physical Exam  Constitutional: Vital signs are normal. She appears well-developed and well-nourished. She is cooperative. Non-toxic appearance.  She does not appear ill. No distress.  Morbidly obese  HENT:  Head: Normocephalic.  Right Ear: Hearing, tympanic membrane, external ear and ear canal normal.  Left Ear: Hearing, tympanic membrane, external ear and ear canal normal.  Nose: Nose normal.  Eyes: Conjunctivae normal, EOM and lids are normal. Pupils are equal, round, and reactive to light. No foreign bodies found.  Neck: Trachea normal and normal range of motion. Neck supple. Carotid bruit is not present. No mass and no thyromegaly present.  Cardiovascular: Normal rate, regular rhythm, S1 normal, S2 normal, normal heart sounds and intact distal pulses. Exam reveals no gallop.  No murmur heard.  Pulmonary/Chest: Effort normal and breath sounds normal. No respiratory distress. She has no wheezes. She has no rhonchi. She has no rales.  Abdominal: Soft. Normal appearance and bowel sounds are normal. She exhibits no distension, no fluid wave, no abdominal bruit and no mass. There is no hepatosplenomegaly. There is no tenderness. There is no rebound, no guarding and no CVA tenderness. No hernia.  Genitourinary:  No PAP performed.  Breast exam: No mass, nodules, thickening, tenderness, bulging, retraction, inflamation, nipple discharge or skin changes noted.  No axillary or clavicular LA.  Chaperoned exam.   Musculoskeletal: Left arm: ttp over lateral epicondyle. strgnth 5/5 in left upper ext. Lymphadenopathy:  She has no cervical adenopathy.  She has no axillary adenopathy.  Neurological: She is alert. She has normal strength. No cranial nerve deficit or sensory deficit.  Skin: Skin is warm, dry and intact. Improved neck ash noted.  Psychiatric: Her speech is normal and behavior is normal. Judgment normal. Her mood appears not anxious. Cognition and memory are normal. She does not  exhibit a depressed mood.  Assessment & Plan:   The patient's preventative maintenance and recommended screening tests for an annual wellness exam  were reviewed in full today.  Brought up to date unless services declined.  Counselled on the importance of diet, exercise, and its role in overall health and mortality.  The patient's FH and SH was reviewed, including their home life, tobacco status, and drug and alcohol status.   Vaccines: Uptodate with tdap.  Gets flu at work. Mammo: 8&10/2014,benign lesion followed, had repeat yesterday resulst not back yet. PAP/DVE: nml 2012,nml 2013, nml and neg co-testing 2016, repeat PAP in 2021, yearly DVE.  Smoking still ..5 cig per day. 10 pack year history pre-contemplative.  Refused HIV/STD testing.

## 2015-10-08 NOTE — Assessment & Plan Note (Signed)
Stable control on no med.

## 2015-10-08 NOTE — Patient Instructions (Addendum)
Keep working in healthy eating and add back back exercise.  Work on quitting smoking, set up plan and set quit date.

## 2015-10-21 MED FILL — OMEPRAZOLE DR 20 MG CAPSULE: 20 | 90 days supply | Qty: 90 | Fill #1

## 2015-10-21 MED FILL — LEVOTHYROXINE 25 MCG TABLET: 25 | 90 days supply | Qty: 90 | Fill #1

## 2015-12-02 DIAGNOSIS — H524 Presbyopia: Secondary | ICD-10-CM | POA: Diagnosis not present

## 2016-03-31 MED FILL — predniSONE 20 MG TABS: 20 | 5 days supply | Qty: 10 | Fill #0

## 2016-05-20 ENCOUNTER — Encounter: Payer: Self-pay | Admitting: Family Medicine

## 2016-05-21 MED ORDER — LEVOTHYROXINE SODIUM 25 MCG PO TABS: 25.0000 ug | ORAL_TABLET | Freq: Every day | ORAL | 1 refills | Status: DC

## 2016-05-21 MED ORDER — OMEPRAZOLE 20 MG PO CPDR: 20.0000 mg | DELAYED_RELEASE_CAPSULE | Freq: Every day | ORAL | 1 refills | Status: AC

## 2016-09-01 ENCOUNTER — Other Ambulatory Visit: Payer: Self-pay | Admitting: Family Medicine

## 2016-09-01 DIAGNOSIS — Z1231 Encounter for screening mammogram for malignant neoplasm of breast: Secondary | ICD-10-CM

## 2016-10-07 ENCOUNTER — Ambulatory Visit
Admission: RE | Admit: 2016-10-07 | Discharge: 2016-10-07 | Disposition: A | Discharge status: Home / Self Care | Payer: 59 | Source: Ambulatory Visit | Attending: Family Medicine | Admitting: Family Medicine

## 2016-10-07 DIAGNOSIS — Z1231 Encounter for screening mammogram for malignant neoplasm of breast: Secondary | ICD-10-CM | POA: Diagnosis not present

## 2017-09-14 DIAGNOSIS — J029 Acute pharyngitis, unspecified: Secondary | ICD-10-CM | POA: Diagnosis not present

## 2017-09-14 DIAGNOSIS — R05 Cough: Secondary | ICD-10-CM | POA: Diagnosis not present

## 2017-09-14 DIAGNOSIS — R0981 Nasal congestion: Secondary | ICD-10-CM | POA: Diagnosis not present

## 2017-09-14 DIAGNOSIS — E669 Obesity, unspecified: Secondary | ICD-10-CM | POA: Diagnosis not present

## 2017-10-11 ENCOUNTER — Other Ambulatory Visit: Payer: Self-pay | Admitting: Family Medicine

## 2017-10-11 DIAGNOSIS — Z1231 Encounter for screening mammogram for malignant neoplasm of breast: Secondary | ICD-10-CM

## 2017-11-08 ENCOUNTER — Ambulatory Visit: Payer: 59

## 2017-11-09 ENCOUNTER — Ambulatory Visit
Admission: RE | Admit: 2017-11-09 | Discharge: 2017-11-09 | Disposition: A | Discharge status: Home / Self Care | Payer: 59 | Source: Ambulatory Visit | Attending: Family Medicine | Admitting: Family Medicine

## 2017-11-09 DIAGNOSIS — Z1231 Encounter for screening mammogram for malignant neoplasm of breast: Secondary | ICD-10-CM

## 2018-11-01 ENCOUNTER — Other Ambulatory Visit: Payer: Self-pay | Admitting: Family Medicine

## 2018-11-01 DIAGNOSIS — Z1231 Encounter for screening mammogram for malignant neoplasm of breast: Secondary | ICD-10-CM

## 2018-11-02 ENCOUNTER — Other Ambulatory Visit: Payer: Self-pay

## 2018-11-02 DIAGNOSIS — Z20822 Contact with and (suspected) exposure to covid-19: Secondary | ICD-10-CM

## 2018-11-02 DIAGNOSIS — R6889 Other general symptoms and signs: Secondary | ICD-10-CM | POA: Diagnosis not present

## 2018-11-04 LAB — NOVEL CORONAVIRUS, NAA: SARS-CoV-2, NAA: NOT DETECTED

## 2018-11-29 ENCOUNTER — Ambulatory Visit: Payer: 59 | Admitting: Family Medicine

## 2018-11-29 ENCOUNTER — Other Ambulatory Visit: Payer: Self-pay

## 2018-11-29 VITALS — BP 132/100 | HR 70 | Temp 97.8°F | Ht 64.75 in | Wt 297.5 lb

## 2018-11-29 DIAGNOSIS — B372 Candidiasis of skin and nail: Secondary | ICD-10-CM | POA: Insufficient documentation

## 2018-11-29 MED ORDER — NYSTATIN 100000 UNIT/GM EX POWD: Freq: Four times a day (QID) | CUTANEOUS | 2 refills | Status: DC

## 2018-11-29 NOTE — Patient Instructions (Signed)
Intertrigo Intertrigo is skin irritation or inflammation (dermatitis) that occurs when folds of skin rub together. The irritation can cause a rash and make skin raw and itchy. This condition most commonly occurs in the skin folds of these areas:  Toes.  Armpits.  Groin.  Under the belly.  Under the breasts.  Buttocks. Intertrigo is not passed from person to person (is not contagious). What are the causes? This condition is caused by heat, moisture, rubbing (friction), and not enough air circulation. The condition can be made worse by:  Sweat.  Bacteria.  A fungus, such as yeast. What increases the risk? This condition is more likely to occur if you have moisture in your skin folds. You are more likely to develop this condition if you:  Have diabetes.  Are overweight.  Are not able to move around or are not active.  Live in a warm and moist climate.  Wear splints, braces, or other medical devices.  Are not able to control your bowels or bladder (have incontinence). What are the signs or symptoms? Symptoms of this condition include:  A pink or red skin rash in the skin fold or near the skin fold.  Raw or scaly skin.  Itchiness.  A burning feeling.  Bleeding.  Leaking fluid.  A bad smell. How is this diagnosed? This condition is diagnosed with a medical history and physical exam. You may also have a skin swab to test for bacteria or a fungus. How is this treated? This condition may be treated by:  Cleaning and drying your skin.  Taking an antibiotic medicine or using an antibiotic skin cream for a bacterial infection.  Using an antifungal cream on your skin or taking pills for an infection that was caused by a fungus, such as yeast.  Using a steroid ointment to relieve itchiness and irritation.  Separating the skin fold with a clean cotton cloth to absorb moisture and allow air to flow into the area. Follow these instructions at home:  Keep the  affected area clean and dry.  Do not scratch your skin.  Stay in a cool environment as much as possible. Use an air conditioner or fan, if available.  Apply over-the-counter and prescription medicines only as told by your health care provider.  If you were prescribed an antibiotic medicine, use it as told by your health care provider. Do not stop using the antibiotic even if your condition improves.  Keep all follow-up visits as told by your health care provider. This is important. How is this prevented?   Maintain a healthy weight.  Take care of your feet, especially if you have diabetes. Foot care includes: ? Wearing shoes that fit well. ? Keeping your feet dry. ? Wearing clean, breathable socks.  Protect the skin around your groin and buttocks, especially if you have incontinence. Skin protection includes: ? Following a regular cleaning routine. ? Using skin protectant creams, powders, or ointments. ? Changing protection pads frequently.  Do not wear tight clothes. Wear clothes that are loose, absorbent, and made of cotton.  Wear a bra that gives good support, if needed.  Shower and dry yourself well after activity or exercise. Use a hair dryer on a cool setting to dry between skin folds, especially after you bathe.  If you have diabetes, keep your blood sugar under control. Contact a health care provider if:  Your symptoms do not improve with treatment.  Your symptoms get worse or they spread.  You notice increased redness and  warmth.  You have a fever. Summary  Intertrigo is skin irritation or inflammation (dermatitis) that occurs when folds of skin rub together.  This condition is caused by heat, moisture, rubbing (friction), and not enough air circulation.  This condition may be treated by cleaning and drying your skin and with medicines.  Apply over-the-counter and prescription medicines only as told by your health care provider.  Keep all follow-up visits  as told by your health care provider. This is important. This information is not intended to replace advice given to you by your health care provider. Make sure you discuss any questions you have with your health care provider. Document Released: 02/09/2005 Document Revised: 07/12/2017 Document Reviewed: 07/12/2017 [employer] Patient Education  2020 [employer]  

## 2018-11-29 NOTE — Assessment & Plan Note (Signed)
Treat with topical nystatin... pt request powder. Info given. Encouraged weight loss.

## 2018-11-29 NOTE — Progress Notes (Signed)
Chief Complaint  Patient presents with  . Rash    Under Breast    History of Present Illness: HPI    48 year old  Morbidly obese female presents with new onset rash under breasts.. presents all summer long.  Occ, not painful.  Moist redness under both breast, some odor.   She has been treating with hydrocortisone cream.  Body mass index is 49.89 kg/m.     no other rash, no fever.   Similar in past... better with nystatin.  COVID 19 screen No recent travel or known exposure to COVID19 The patient denies respiratory symptoms of COVID 19 at this time.  The importance of social distancing was discussed today.   Review of Systems  Constitutional: Negative for chills and fever.  HENT: Negative for congestion and ear pain.   Eyes: Negative for pain and redness.  Respiratory: Negative for cough and shortness of breath.   Cardiovascular: Negative for chest pain, palpitations and leg swelling.  Gastrointestinal: Negative for abdominal pain, blood in stool, constipation, diarrhea, nausea and vomiting.  Genitourinary: Negative for dysuria.  Musculoskeletal: Negative for falls and myalgias.  Skin: Negative for rash.  Neurological: Negative for dizziness.  Psychiatric/Behavioral: Negative for depression. The patient is not nervous/anxious.       Past Medical History:  Diagnosis Date  . Abrasion of right wrist 04/15/2012  . Arthritis    ankles  . Cough 04/15/2012  . Dental crowns present   . GERD (gastroesophageal reflux disease)   . Hypothyroidism   . Perimenopausal 03/2012   irregular periods  . Retained orthopedic hardware 03/2012   left ankle  . Stuffy and runny nose    clear drainage from nose - URI onset 03/30/2012    reports that she has been smoking cigarettes. She has smoked for the past 10.00 years. She has never used smokeless tobacco. She reports current alcohol use. She reports that she does not use drugs.   Current Outpatient Medications:  .  omeprazole  (PRILOSEC) 20 MG capsule, Take 1 capsule (20 mg total) by mouth daily., Disp: 90 capsule, Rfl: 1 .  levothyroxine (SYNTHROID, LEVOTHROID) 25 MCG tablet, Take 1 tablet (25 mcg total) by mouth daily. (Patient not taking: Reported on 11/29/2018), Disp: 90 tablet, Rfl: 1   Observations/Objective: Blood pressure (!) 132/100, pulse 70, temperature 97.8 F (36.6 C), temperature source Oral, height 5' 4.75" (1.645 m), weight 297 lb 8 oz (134.9 kg), last menstrual period 10/16/2018, SpO2 95 %, not currently breastfeeding.  Physical Exam Constitutional:      General: She is not in acute distress.    Appearance: Normal appearance. She is well-developed. She is obese. She is not ill-appearing or toxic-appearing.  HENT:     Head: Normocephalic.     Right Ear: Hearing, tympanic membrane, ear canal and external ear normal. Tympanic membrane is not erythematous, retracted or bulging.     Left Ear: Hearing, tympanic membrane, ear canal and external ear normal. Tympanic membrane is not erythematous, retracted or bulging.     Nose: No mucosal edema or rhinorrhea.     Right Sinus: No maxillary sinus tenderness or frontal sinus tenderness.     Left Sinus: No maxillary sinus tenderness or frontal sinus tenderness.     Mouth/Throat:     Pharynx: Uvula midline.  Eyes:     General: Lids are normal. Lids are everted, no foreign bodies appreciated.     Conjunctiva/sclera: Conjunctivae normal.     Pupils: Pupils are equal, round, and  reactive to light.  Neck:     Musculoskeletal: Normal range of motion and neck supple.     Thyroid: No thyroid mass or thyromegaly.     Vascular: No carotid bruit.     Trachea: Trachea normal.  Cardiovascular:     Rate and Rhythm: Normal rate and regular rhythm.     Pulses: Normal pulses.     Heart sounds: Normal heart sounds, S1 normal and S2 normal. No murmur. No friction rub. No gallop.   Pulmonary:     Effort: Pulmonary effort is normal. No tachypnea or respiratory distress.      Breath sounds: Normal breath sounds. No decreased breath sounds, wheezing, rhonchi or rales.  Abdominal:     General: Bowel sounds are normal.     Palpations: Abdomen is soft.     Tenderness: There is no abdominal tenderness.  Skin:    General: Skin is warm and dry.     Findings: Erythema present. No rash.     Comments:  Under bilateral breasts, moist  Neurological:     Mental Status: She is alert.  Psychiatric:        Mood and Affect: Mood is not anxious or depressed.        Speech: Speech normal.        Behavior: Behavior normal. Behavior is cooperative.        Thought Content: Thought content normal.        Judgment: Judgment normal.      Assessment and Plan   Candidal intertrigo Treat with topical nystatin... pt request powder. Info given. Encouraged weight loss.     Eliezer Lofts, MD

## 2018-12-14 ENCOUNTER — Other Ambulatory Visit: Payer: Self-pay

## 2018-12-14 ENCOUNTER — Ambulatory Visit
Admission: RE | Admit: 2018-12-14 | Discharge: 2018-12-14 | Disposition: A | Discharge status: Home / Self Care | Payer: 59 | Source: Ambulatory Visit | Attending: Family Medicine | Admitting: Family Medicine

## 2018-12-14 DIAGNOSIS — Z1231 Encounter for screening mammogram for malignant neoplasm of breast: Secondary | ICD-10-CM

## 2018-12-21 ENCOUNTER — Telehealth: Payer: Self-pay

## 2018-12-21 NOTE — Telephone Encounter (Signed)
LVM to call clinic, pt needs COVID screen, back lab and front door info

## 2018-12-26 ENCOUNTER — Telehealth: Payer: Self-pay | Admitting: Family Medicine

## 2018-12-26 DIAGNOSIS — Z13220 Encounter for screening for lipoid disorders: Secondary | ICD-10-CM

## 2018-12-26 DIAGNOSIS — E039 Hypothyroidism, unspecified: Secondary | ICD-10-CM

## 2018-12-26 NOTE — Telephone Encounter (Signed)
-----   Message from Ellamae Sia sent at 12/19/2018 12:35 PM EDT ----- Regarding: lab orders for Tuesday, 11.3.20 Patient is scheduled for CPX labs, please order future labs, Thanks , Karna Christmas

## 2018-12-27 ENCOUNTER — Other Ambulatory Visit: Payer: Self-pay

## 2018-12-27 ENCOUNTER — Other Ambulatory Visit: Payer: 59

## 2018-12-30 ENCOUNTER — Ambulatory Visit (INDEPENDENT_AMBULATORY_CARE_PROVIDER_SITE_OTHER): Payer: 59 | Admitting: Family Medicine

## 2018-12-30 ENCOUNTER — Other Ambulatory Visit: Payer: Self-pay

## 2018-12-30 ENCOUNTER — Encounter: Payer: Self-pay | Admitting: Family Medicine

## 2018-12-30 VITALS — BP 132/100 | HR 73 | Temp 97.7°F | Ht 64.75 in | Wt 298.4 lb

## 2018-12-30 DIAGNOSIS — E039 Hypothyroidism, unspecified: Secondary | ICD-10-CM | POA: Diagnosis not present

## 2018-12-30 DIAGNOSIS — Z13220 Encounter for screening for lipoid disorders: Secondary | ICD-10-CM

## 2018-12-30 DIAGNOSIS — Z114 Encounter for screening for human immunodeficiency virus [HIV]: Secondary | ICD-10-CM | POA: Diagnosis not present

## 2018-12-30 DIAGNOSIS — Z0000 Encounter for general adult medical examination without abnormal findings: Secondary | ICD-10-CM

## 2018-12-30 DIAGNOSIS — I10 Essential (primary) hypertension: Secondary | ICD-10-CM

## 2018-12-30 DIAGNOSIS — F330 Major depressive disorder, recurrent, mild: Secondary | ICD-10-CM

## 2018-12-30 DIAGNOSIS — Z720 Tobacco use: Secondary | ICD-10-CM | POA: Diagnosis not present

## 2018-12-30 LAB — POC URINALSYSI DIPSTICK (AUTOMATED)
Bilirubin, UA: NEGATIVE
Blood, UA: NEGATIVE
Glucose, UA: NEGATIVE
Ketones, UA: NEGATIVE
Leukocytes, UA: NEGATIVE
Nitrite, UA: NEGATIVE
Protein, UA: NEGATIVE
Spec Grav, UA: 1.025 (ref 1.010–1.025)
Urobilinogen, UA: 0.2 E.U./dL
pH, UA: 5.5 (ref 5.0–8.0)

## 2018-12-30 LAB — LIPID PANEL
Cholesterol: 204 mg/dL — ABNORMAL HIGH (ref 0–200)
HDL: 39.4 mg/dL (ref >39.00)
LDL Cholesterol: 138 mg/dL — ABNORMAL HIGH (ref 0–99)
NonHDL: 164.93
Total CHOL/HDL Ratio: 5
Triglycerides: 136 mg/dL (ref 0.0–149.0)
VLDL: 27.2 mg/dL (ref 0.0–40.0)

## 2018-12-30 LAB — T3, FREE: T3, Free: 4.3 pg/mL — ABNORMAL HIGH (ref 2.3–4.2)

## 2018-12-30 LAB — COMPREHENSIVE METABOLIC PANEL
ALT: 24 U/L (ref 0–35)
AST: 15 U/L (ref 0–37)
Albumin: 4.4 g/dL (ref 3.5–5.2)
Alkaline Phosphatase: 152 U/L — ABNORMAL HIGH (ref 39–117)
BUN: 13 mg/dL (ref 6–23)
CO2: 28 mEq/L (ref 19–32)
Calcium: 9.9 mg/dL (ref 8.4–10.5)
Chloride: 102 mEq/L (ref 96–112)
Creatinine, Ser: 0.78 mg/dL (ref 0.40–1.20)
GFR: 78.3 mL/min (ref >60.00)
Glucose, Bld: 90 mg/dL (ref 70–99)
Potassium: 4.2 mEq/L (ref 3.5–5.1)
Sodium: 141 mEq/L (ref 135–145)
Total Bilirubin: 0.6 mg/dL (ref 0.2–1.2)
Total Protein: 7.1 g/dL (ref 6.0–8.3)

## 2018-12-30 LAB — T4, FREE: Free T4: 0.92 ng/dL (ref 0.60–1.60)

## 2018-12-30 LAB — TSH: TSH: 6.42 u[IU]/mL — ABNORMAL HIGH (ref 0.35–4.50)

## 2018-12-30 MED ORDER — LOSARTAN POTASSIUM-HCTZ 50-12.5 MG PO TABS: 1.0000 | ORAL_TABLET | Freq: Every day | ORAL | 3 refills | Status: DC

## 2018-12-30 MED FILL — LOSARTAN-HCTZ 50-12.5 MG TA: 50-12.5 | 90 days supply | Qty: 90 | Fill #0

## 2018-12-30 NOTE — Progress Notes (Signed)
Chief Complaint  Patient presents with  . Annual Exam    History of Present Illness: HPI The patient is here for annual wellness exam and preventative care.     Elevated BP without Dx of HTN  No HA, no CP, no SOB. She does not have cuff home.  She has swelling in both ankles.  BP Readings from Last 3 Encounters:  12/30/18 (!) 132/100  11/29/18 (!) 132/100  10/08/15 110/90   Body mass index is 50.04 kg/m. Wt Readings from Last 3 Encounters:  12/30/18 298 lb 6 oz (135.3 kg)  11/29/18 297 lb 8 oz (134.9 kg)  10/08/15 275 lb (124.7 kg)    Due for chol re-eval.  Hypothyroidism  Due for re-eval. Lab Results  Component Value Date   TSH 3.47 10/03/2015    MDD  In past few years she has had decreased motivation. PHQ 9 ioday 7/27.  She is interested in a Social worker.    COVID 19 screen No recent travel or known exposure to COVID19 The patient denies respiratory symptoms of COVID 19 at this time.  The importance of social distancing was discussed today.   Review of Systems  Constitutional: Negative for chills and fever.  HENT: Negative for congestion and ear pain.   Eyes: Negative for pain and redness.  Respiratory: Negative for cough and shortness of breath.   Cardiovascular: Negative for chest pain, palpitations and leg swelling.  Gastrointestinal: Negative for abdominal pain, blood in stool, constipation, diarrhea, nausea and vomiting.  Genitourinary: Negative for dysuria.  Musculoskeletal: Negative for falls and myalgias.  Skin: Negative for rash.  Neurological: Negative for dizziness.  Psychiatric/Behavioral: Positive for depression. The patient is not nervous/anxious.       Past Medical History:  Diagnosis Date  . Abrasion of right wrist 04/15/2012  . Arthritis    ankles  . Cough 04/15/2012  . Dental crowns present   . GERD (gastroesophageal reflux disease)   . Hypothyroidism   . Perimenopausal 03/2012   irregular periods  . Retained orthopedic hardware  03/2012   left ankle  . Stuffy and runny nose    clear drainage from nose - URI onset 03/30/2012    reports that she has been smoking cigarettes. She has smoked for the past 10.00 years. She has never used smokeless tobacco. She reports current alcohol use. She reports that she does not use drugs.   Current Outpatient Medications:  .  nystatin (MYCOSTATIN/NYSTOP) powder, Apply topically 4 (four) times daily., Disp: 15 g, Rfl: 2 .  omeprazole (PRILOSEC) 20 MG capsule, Take 1 capsule (20 mg total) by mouth daily., Disp: 90 capsule, Rfl: 1 .  levothyroxine (SYNTHROID, LEVOTHROID) 25 MCG tablet, Take 1 tablet (25 mcg total) by mouth daily. (Patient not taking: Reported on 11/29/2018), Disp: 90 tablet, Rfl: 1   Observations/Objective: Blood pressure (!) 132/100, pulse 73, temperature 97.7 F (36.5 C), temperature source Oral, height 5' 4.75" (1.645 m), weight 298 lb 6 oz (135.3 kg), last menstrual period 10/23/2018, SpO2 98 %.  Physical Exam Constitutional:      General: She is not in acute distress.    Appearance: Normal appearance. She is well-developed. She is obese. She is not ill-appearing or toxic-appearing.  HENT:     Head: Normocephalic.     Right Ear: Hearing, tympanic membrane, ear canal and external ear normal.     Left Ear: Hearing, tympanic membrane, ear canal and external ear normal.     Nose: Nose normal.  Eyes:  General: Lids are normal. Lids are everted, no foreign bodies appreciated.     Conjunctiva/sclera: Conjunctivae normal.     Pupils: Pupils are equal, round, and reactive to light.  Neck:     Musculoskeletal: Normal range of motion and neck supple.     Thyroid: No thyroid mass or thyromegaly.     Vascular: No carotid bruit.     Trachea: Trachea normal.  Cardiovascular:     Rate and Rhythm: Normal rate and regular rhythm.     Heart sounds: Normal heart sounds, S1 normal and S2 normal. No murmur. No gallop.   Pulmonary:     Effort: Pulmonary effort is normal. No  respiratory distress.     Breath sounds: Normal breath sounds. No wheezing, rhonchi or rales.  Abdominal:     General: Bowel sounds are normal. There is no distension or abdominal bruit.     Palpations: Abdomen is soft. There is no fluid wave or mass.     Tenderness: There is no abdominal tenderness. There is no guarding or rebound.     Hernia: No hernia is present.  Lymphadenopathy:     Cervical: No cervical adenopathy.  Skin:    General: Skin is warm and dry.     Findings: No rash.  Neurological:     Mental Status: She is alert.     Cranial Nerves: No cranial nerve deficit.     Sensory: No sensory deficit.  Psychiatric:        Mood and Affect: Mood is not anxious or depressed.        Speech: Speech normal.        Behavior: Behavior normal. Behavior is cooperative.        Judgment: Judgment normal.      Assessment and Plan   The patient's preventative maintenance and recommended screening tests for an annual wellness exam were reviewed in full today. Brought up to date unless services declined.  Counselled on the importance of diet, exercise, and its role in overall health and mortality. The patient's FH and SH was reviewed, including their home life, tobacco status, and drug and alcohol status.   Vaccines: Uptodate with tdap. Gets flu at work. Mammo: 11/2018 normal repeat yearly PAP/DVE: nml 2012,nml 2013, nml and neg co-testing 2016, repeat PAP in 2021, yearly DVE.  Smoking still ..5 cig per day. 10 pack year history. Still pre-contemplative.   HIV/STD testing pending.   Eliezer Lofts, MD

## 2018-12-30 NOTE — Patient Instructions (Addendum)
Call to set up appt with counselor as discussed.   Work on increased exercise, And weighyt loss, heart health diet.   Start losartan/HCTZ daily for BP.  Follow BP at home goal < 140/90.  Quit smoking.

## 2018-12-31 DIAGNOSIS — I10 Essential (primary) hypertension: Secondary | ICD-10-CM | POA: Insufficient documentation

## 2018-12-31 LAB — HIV ANTIBODY (ROUTINE TESTING W REFLEX): HIV 1&2 Ab, 4th Generation: NONREACTIVE

## 2018-12-31 NOTE — Assessment & Plan Note (Signed)
Due for re-eval. 

## 2018-12-31 NOTE — Assessment & Plan Note (Signed)
Referred to counseling.

## 2018-12-31 NOTE — Assessment & Plan Note (Signed)
Work on increased exercise, And weighyt loss, heart health diet.   Start losartan/HCTZ daily for BP.  Follow BP at home goal < 140/90.  Quit smoking.

## 2018-12-31 NOTE — Assessment & Plan Note (Signed)
If mood issues require treatment .. wellbutrin would be a good option. Pt will use nicotine replacement for now.

## 2019-01-13 ENCOUNTER — Other Ambulatory Visit: Payer: Self-pay

## 2019-01-13 ENCOUNTER — Ambulatory Visit (INDEPENDENT_AMBULATORY_CARE_PROVIDER_SITE_OTHER): Payer: 59 | Admitting: Family Medicine

## 2019-01-13 ENCOUNTER — Encounter: Payer: Self-pay | Admitting: Family Medicine

## 2019-01-13 VITALS — BP 120/82 | HR 89 | Temp 98.7°F | Ht 64.75 in | Wt 293.2 lb

## 2019-01-13 DIAGNOSIS — I10 Essential (primary) hypertension: Secondary | ICD-10-CM | POA: Diagnosis not present

## 2019-01-13 DIAGNOSIS — F330 Major depressive disorder, recurrent, mild: Secondary | ICD-10-CM | POA: Diagnosis not present

## 2019-01-13 DIAGNOSIS — E039 Hypothyroidism, unspecified: Secondary | ICD-10-CM

## 2019-01-13 LAB — BASIC METABOLIC PANEL
BUN: 17 mg/dL (ref 6–23)
CO2: 31 mEq/L (ref 19–32)
Calcium: 10.3 mg/dL (ref 8.4–10.5)
Chloride: 101 mEq/L (ref 96–112)
Creatinine, Ser: 0.76 mg/dL (ref 0.40–1.20)
GFR: 80.67 mL/min (ref >60.00)
Glucose, Bld: 81 mg/dL (ref 70–99)
Potassium: 3.5 mEq/L (ref 3.5–5.1)
Sodium: 140 mEq/L (ref 135–145)

## 2019-01-13 MED ORDER — LEVOTHYROXINE SODIUM 25 MCG PO TABS: 25.0000 ug | ORAL_TABLET | Freq: Every day | ORAL | 1 refills | Status: DC

## 2019-01-13 NOTE — Assessment & Plan Note (Signed)
Has been off levothyroxine... re-eval shows need. MAy be contributing to symptoms.  Start levothyroxine 25 mcg daily. Repeat TSH free t3 and free t4 in 4 weeks.

## 2019-01-13 NOTE — Patient Instructions (Signed)
Start levothyroxine low dose. Return for lab only visit in 4 weeks. Set up counseling. CPX in 12/30/2019

## 2019-01-13 NOTE — Assessment & Plan Note (Signed)
Well controlled. Continue current medication. Encouraged exercise, weight loss, healthy eating habits.  

## 2019-01-13 NOTE — Assessment & Plan Note (Signed)
Plans to set up counseling today.

## 2019-01-13 NOTE — Progress Notes (Signed)
Chief Complaint  Patient presents with  . Follow-up    HTN    History of Present Illness: HPI   Hypertension:    Improved control on losartan HCTZ BP Readings from Last 3 Encounters:  01/13/19 120/82  12/30/18 (!) 132/100  11/29/18 (!) 132/100  Using medication without problems or lightheadedness: none Chest pain with exertion:none Edema: improved Short of breath: Average home BPs:123/78. Other issues:  Elevated TSH, free t3.. she has history of  Hypothyroid but has been off for 2 years.  COVID 19 screen No recent travel or known exposure to COVID19 The patient denies respiratory symptoms of COVID 19 at this time.  The importance of social distancing was discussed today.   Review of Systems  Constitutional: Negative for chills and fever.  HENT: Negative for congestion and ear pain.   Eyes: Negative for pain and redness.  Respiratory: Negative for cough and shortness of breath.   Cardiovascular: Negative for chest pain, palpitations and leg swelling.  Gastrointestinal: Negative for abdominal pain, blood in stool, constipation, diarrhea, nausea and vomiting.  Genitourinary: Negative for dysuria.  Musculoskeletal: Negative for falls and myalgias.  Skin: Negative for rash.  Neurological: Negative for dizziness.  Psychiatric/Behavioral: Negative for depression. The patient is not nervous/anxious.       Past Medical History:  Diagnosis Date  . Abrasion of right wrist 04/15/2012  . Arthritis    ankles  . Cough 04/15/2012  . Dental crowns present   . GERD (gastroesophageal reflux disease)   . Hypothyroidism   . Perimenopausal 03/2012   irregular periods  . Retained orthopedic hardware 03/2012   left ankle  . Stuffy and runny nose    clear drainage from nose - URI onset 03/30/2012    reports that she has been smoking cigarettes. She has smoked for the past 10.00 years. She has never used smokeless tobacco. She reports current alcohol use. She reports that she does not use  drugs.   Current Outpatient Medications:  .  losartan-hydrochlorothiazide (HYZAAR) 50-12.5 MG tablet, Take 1 tablet by mouth daily., Disp: 90 tablet, Rfl: 3 .  nystatin (MYCOSTATIN/NYSTOP) powder, Apply topically 4 (four) times daily., Disp: 15 g, Rfl: 2 .  omeprazole (PRILOSEC) 20 MG capsule, Take 1 capsule (20 mg total) by mouth daily., Disp: 90 capsule, Rfl: 1 .  levothyroxine (SYNTHROID, LEVOTHROID) 25 MCG tablet, Take 1 tablet (25 mcg total) by mouth daily. (Patient not taking: Reported on 11/29/2018), Disp: 90 tablet, Rfl: 1   Observations/Objective: Blood pressure 120/82, pulse 89, temperature 98.7 F (37.1 C), temperature source Temporal, height 5' 4.75" (1.645 m), weight 293 lb 4 oz (133 kg), last menstrual period 10/23/2018, SpO2 96 %.  Physical Exam Constitutional:      General: She is not in acute distress.    Appearance: Normal appearance. She is well-developed. She is obese. She is not ill-appearing or toxic-appearing.  HENT:     Head: Normocephalic.     Right Ear: Hearing, tympanic membrane, ear canal and external ear normal. Tympanic membrane is not erythematous, retracted or bulging.     Left Ear: Hearing, tympanic membrane, ear canal and external ear normal. Tympanic membrane is not erythematous, retracted or bulging.     Nose: No mucosal edema or rhinorrhea.     Right Sinus: No maxillary sinus tenderness or frontal sinus tenderness.     Left Sinus: No maxillary sinus tenderness or frontal sinus tenderness.     Mouth/Throat:     Pharynx: Uvula midline.  Eyes:  General: Lids are normal. Lids are everted, no foreign bodies appreciated.     Conjunctiva/sclera: Conjunctivae normal.     Pupils: Pupils are equal, round, and reactive to light.  Neck:     Musculoskeletal: Normal range of motion and neck supple.     Thyroid: No thyroid mass or thyromegaly.     Vascular: No carotid bruit.     Trachea: Trachea normal.  Cardiovascular:     Rate and Rhythm: Normal rate and  regular rhythm.     Pulses: Normal pulses.     Heart sounds: Normal heart sounds, S1 normal and S2 normal. No murmur. No friction rub. No gallop.   Pulmonary:     Effort: Pulmonary effort is normal. No tachypnea or respiratory distress.     Breath sounds: Normal breath sounds. No decreased breath sounds, wheezing, rhonchi or rales.  Abdominal:     General: Bowel sounds are normal.     Palpations: Abdomen is soft.     Tenderness: There is no abdominal tenderness.  Skin:    General: Skin is warm and dry.     Findings: No rash.  Neurological:     Mental Status: She is alert.  Psychiatric:        Mood and Affect: Mood is not anxious or depressed.        Speech: Speech normal.        Behavior: Behavior normal. Behavior is cooperative.        Thought Content: Thought content normal.        Judgment: Judgment normal.      Assessment and Plan  Hypothyroidism Has been off levothyroxine... re-eval shows need. MAy be contributing to symptoms.  Start levothyroxine 25 mcg daily. Repeat TSH free t3 and free t4 in 4 weeks.  Essential hypertension Well controlled. Continue current medication. Encouraged exercise, weight loss, healthy eating habits.   MDD (major depressive disorder), recurrent episode ([place]) Plans to set up counseling today.     Eliezer Lofts, MD

## 2019-02-10 ENCOUNTER — Other Ambulatory Visit: Payer: Self-pay

## 2019-02-10 ENCOUNTER — Telehealth: Payer: Self-pay | Admitting: Family Medicine

## 2019-02-10 ENCOUNTER — Other Ambulatory Visit (INDEPENDENT_AMBULATORY_CARE_PROVIDER_SITE_OTHER): Payer: 59

## 2019-02-10 DIAGNOSIS — E039 Hypothyroidism, unspecified: Secondary | ICD-10-CM

## 2019-02-10 DIAGNOSIS — I10 Essential (primary) hypertension: Secondary | ICD-10-CM

## 2019-02-10 LAB — T3, FREE: T3, Free: 3.2 pg/mL (ref 2.3–4.2)

## 2019-02-10 LAB — TSH: TSH: 2.14 u[IU]/mL (ref 0.35–4.50)

## 2019-02-10 LAB — T4, FREE: Free T4: 0.84 ng/dL (ref 0.60–1.60)

## 2019-02-10 NOTE — Telephone Encounter (Signed)
-----   Message from Cloyd Stagers, RT sent at 01/31/2019  3:25 PM EST ----- Regarding: Lab Orders for Friday 12.18.2020 Please place lab orders for Friday 12.18.2020, appt notes state "4 week thyroid test" Thank you, Dyke Maes RT(R)

## 2019-04-18 ENCOUNTER — Other Ambulatory Visit: Payer: Self-pay | Admitting: Family Medicine

## 2019-04-18 MED ORDER — NYSTATIN 100000 UNIT/GM EX POWD: Freq: Four times a day (QID) | CUTANEOUS | 2 refills | Status: DC

## 2019-04-18 MED ORDER — LEVOTHYROXINE SODIUM 25 MCG PO TABS: 25.0000 ug | ORAL_TABLET | Freq: Every day | ORAL | 3 refills | Status: DC

## 2019-04-18 MED FILL — NYSTATIN 100000 UNIT/GM POW: 100000 | 7 days supply | Qty: 15 | Fill #0

## 2019-04-18 MED FILL — LEVOTHYROXINE SODIUM 25 MCG: 25 | 90 days supply | Qty: 90 | Fill #0

## 2019-04-18 NOTE — Telephone Encounter (Signed)
Last office visit 01/13/2019 for HTN.  Last refilled 11/29/2018 for 15 g with 2 refills.  No future appointments.

## 2019-05-25 MED FILL — LOSARTAN-HCTZ 50-12.5 MG TA: 50-12.5 | 90 days supply | Qty: 90 | Fill #1

## 2019-07-27 MED FILL — NYSTATIN 100000 UNIT/GM POW: 100000 | 7 days supply | Qty: 15 | Fill #1

## 2019-09-29 MED FILL — NYSTATIN 100,000 UNIT/GM PO: 100000 | 7 days supply | Qty: 15 | Fill #0

## 2019-09-29 MED FILL — LEVOTHYROXINE SODIUM 25 MCG: 25 | 90 days supply | Qty: 90 | Fill #1

## 2019-09-29 MED FILL — LOSARTAN-HCTZ 50-12.5 MG TA: 50-12.5 | 90 days supply | Qty: 90 | Fill #2

## 2019-10-10 ENCOUNTER — Ambulatory Visit (HOSPITAL_COMMUNITY)
Admission: EM | Admit: 2019-10-10 | Discharge: 2019-10-10 | Disposition: A | Discharge status: Home / Self Care | Payer: 59 | Attending: Family Medicine | Admitting: Family Medicine

## 2019-10-10 DIAGNOSIS — Z20822 Contact with and (suspected) exposure to covid-19: Secondary | ICD-10-CM | POA: Insufficient documentation

## 2019-10-10 NOTE — ED Triage Notes (Signed)
COVID test for travel

## 2019-10-11 LAB — SARS CORONAVIRUS 2 (TAT 6-24 HRS): SARS Coronavirus 2: NEGATIVE

## 2019-10-13 DIAGNOSIS — Z23 Encounter for immunization: Secondary | ICD-10-CM | POA: Diagnosis not present

## 2019-11-21 ENCOUNTER — Ambulatory Visit: Payer: 59 | Admitting: Family Medicine

## 2019-12-12 ENCOUNTER — Other Ambulatory Visit (HOSPITAL_COMMUNITY): Payer: Self-pay | Admitting: Emergency Medicine

## 2019-12-12 ENCOUNTER — Ambulatory Visit: Payer: 59 | Admitting: Family Medicine

## 2019-12-12 ENCOUNTER — Encounter: Payer: Self-pay | Admitting: Family Medicine

## 2019-12-12 ENCOUNTER — Other Ambulatory Visit: Payer: Self-pay

## 2019-12-12 VITALS — BP 138/86 | HR 84 | Temp 98.1°F | Resp 12 | Ht 64.76 in | Wt 303.5 lb

## 2019-12-12 DIAGNOSIS — Z6841 Body Mass Index (BMI) 40.0 and over, adult: Secondary | ICD-10-CM

## 2019-12-12 DIAGNOSIS — F320 Major depressive disorder, single episode, mild: Secondary | ICD-10-CM | POA: Diagnosis not present

## 2019-12-12 DIAGNOSIS — H60543 Acute eczematoid otitis externa, bilateral: Secondary | ICD-10-CM | POA: Insufficient documentation

## 2019-12-12 MED ORDER — TRULICITY 0.75 MG/0.5ML ~~LOC~~ SOAJ: 0.7500 mg | SUBCUTANEOUS | 5 refills | Status: DC

## 2019-12-12 MED FILL — TRULICITY 0.75 MG/0.5 ML PE: 0.75 | 28 days supply | Qty: 2 | Fill #0

## 2019-12-12 NOTE — Patient Instructions (Addendum)
Start trulicity weekly. Call if not covered by insurance. Decrease or stop soda intake. Increase water intake. Increase activity with house cleaning. Nutritionist with call with referral appointment.  Apply topical steroid ( Cortisone 10) OTC to ear canal for eczema.

## 2019-12-12 NOTE — Assessment & Plan Note (Signed)
Treat with topical steroid

## 2019-12-12 NOTE — Assessment & Plan Note (Signed)
May need med to treat and referral to counseling. Re-eval at 2 week follow up.

## 2019-12-12 NOTE — Assessment & Plan Note (Signed)
Counseled on weihgt management options.. plan diet change, increase exercise, referral to nutrition and start of Trulicty.  follow up in 2 weeks.  May consider medical weight loss referral if needed.

## 2019-12-12 NOTE — Progress Notes (Signed)
Chief Complaint  Patient presents with  . Medication Management    Pt would like to discuss a medication for weight loss     History of Present Illness: HPI    49 year old super morbidly obese  female presents for weight management.  Comorbidities: HTN  Wt Readings from Last 3 Encounters:  12/12/19 (!) 303 lb 8 oz (137.7 kg)  01/13/19 293 lb 4 oz (133 kg)  12/30/18 298 lb 6 oz (135.3 kg)  Body mass index is 50.87 kg/m.   She reports decreased energy. She has decreased motivation.She has been under a lot of stress at work. She has had weight gain in last year. PHQ9: 11  She has tried weight watchers, pre-made meals ( [employer]), keto diet, [place]. Has tried my fitness pal and fit bit.  Initial weight loss but always gains it back.   She feels hungry all the time. She has trouble controlling portion size.  Drinks soda daily.. IN past she used phentermine.. helped initially but gained the weight back.    Has itchy rash  In ears.Marland Kitchen dry flaky skin    This visit occurred during the SARS-CoV-2 public health emergency.  Safety protocols were in place, including screening questions prior to the visit, additional usage of staff PPE, and extensive cleaning of exam room while observing appropriate contact time as indicated for disinfecting solutions.   COVID 19 screen:  No recent travel or known exposure to COVID19 The patient denies respiratory symptoms of COVID 19 at this time. The importance of social distancing was discussed today.     Review of Systems  Constitutional: Positive for malaise/fatigue. Negative for chills and fever.  HENT: Negative for congestion and ear pain.   Eyes: Negative for pain and redness.  Respiratory: Negative for cough and shortness of breath.   Cardiovascular: Negative for chest pain, palpitations and leg swelling.  Gastrointestinal: Negative for abdominal pain, blood in stool, constipation, diarrhea, nausea and vomiting.  Genitourinary:  Negative for dysuria.  Musculoskeletal: Negative for falls and myalgias.  Skin: Negative for rash.  Neurological: Negative for dizziness.  Psychiatric/Behavioral: Negative for depression. The patient is not nervous/anxious.       Past Medical History:  Diagnosis Date  . Abrasion of right wrist 04/15/2012  . Arthritis    ankles  . Cough 04/15/2012  . Dental crowns present   . GERD (gastroesophageal reflux disease)   . Hypothyroidism   . Perimenopausal 03/2012   irregular periods  . Retained orthopedic hardware 03/2012   left ankle  . Stuffy and runny nose    clear drainage from nose - URI onset 03/30/2012    reports that she has been smoking cigarettes. She has smoked for the past 10.00 years. She has never used smokeless tobacco. She reports current alcohol use. She reports that she does not use drugs.   Current Outpatient Medications:  .  levothyroxine (SYNTHROID) 25 MCG tablet, Take 1 tablet (25 mcg total) by mouth daily., Disp: 90 tablet, Rfl: 3 .  losartan-hydrochlorothiazide (HYZAAR) 50-12.5 MG tablet, Take 1 tablet by mouth daily., Disp: 90 tablet, Rfl: 3 .  nystatin (MYCOSTATIN/NYSTOP) powder, Apply topically 4 (four) times daily., Disp: 15 g, Rfl: 2 .  omeprazole (PRILOSEC) 20 MG capsule, Take 1 capsule (20 mg total) by mouth daily., Disp: 90 capsule, Rfl: 1   Observations/Objective: Blood pressure 138/86, pulse 84, temperature 98.1 F (36.7 C), resp. rate 12, height 5' 4.76" (1.645 m), weight (!) 303 lb 8 oz (137.7  kg), SpO2 99 %.  Physical Exam Constitutional:      General: She is not in acute distress.    Appearance: Normal appearance. She is well-developed. She is obese. She is not ill-appearing or toxic-appearing.  HENT:     Head: Normocephalic.     Right Ear: Hearing, tympanic membrane, ear canal and external ear normal.     Left Ear: Hearing, tympanic membrane, ear canal and external ear normal.     Nose: Nose normal.  Eyes:     General: Lids are normal. Lids are  everted, no foreign bodies appreciated.     Conjunctiva/sclera: Conjunctivae normal.     Pupils: Pupils are equal, round, and reactive to light.  Neck:     Thyroid: No thyroid mass or thyromegaly.     Vascular: No carotid bruit.     Trachea: Trachea normal.  Cardiovascular:     Rate and Rhythm: Normal rate and regular rhythm.     Heart sounds: Normal heart sounds, S1 normal and S2 normal. No murmur heard.  No gallop.   Pulmonary:     Effort: Pulmonary effort is normal. No respiratory distress.     Breath sounds: Normal breath sounds. No wheezing, rhonchi or rales.  Abdominal:     General: Bowel sounds are normal. There is no distension or abdominal bruit.     Palpations: Abdomen is soft. There is no fluid wave or mass.     Tenderness: There is no abdominal tenderness. There is no guarding or rebound.     Hernia: No hernia is present.  Musculoskeletal:     Cervical back: Normal range of motion and neck supple.  Lymphadenopathy:     Cervical: No cervical adenopathy.  Skin:    General: Skin is warm and dry.     Findings: No rash.  Neurological:     Mental Status: She is alert.     Cranial Nerves: No cranial nerve deficit.     Sensory: No sensory deficit.  Psychiatric:        Mood and Affect: Mood is not anxious or depressed.        Speech: Speech normal.        Behavior: Behavior normal. Behavior is cooperative.        Judgment: Judgment normal.      Assessment and Plan   Class 3 severe obesity due to excess calories with serious comorbidity and body mass index (BMI) of 50.0 to 59.9 in adult [facility])  Counseled on weihgt management options.. plan diet change, increase exercise, referral to nutrition and start of Trulicty.  follow up in 2 weeks.  May consider medical weight loss referral if needed.  Eczema of external ear, bilateral Treat with topical steroid  MDD (major depressive disorder), single episode, mild ([place]) May need med to treat and referral to counseling.  Re-eval at 2 week follow up.     Eliezer Lofts, MD

## 2019-12-13 MED FILL — TRIAMCINOLONE 0.1% CREAM: 0.1 | 30 days supply | Qty: 30 | Fill #0

## 2019-12-24 ENCOUNTER — Telehealth: Payer: Self-pay | Admitting: Family Medicine

## 2019-12-24 DIAGNOSIS — E039 Hypothyroidism, unspecified: Secondary | ICD-10-CM

## 2019-12-24 DIAGNOSIS — I10 Essential (primary) hypertension: Secondary | ICD-10-CM

## 2019-12-24 NOTE — Telephone Encounter (Signed)
-----   Message from Cloyd Stagers, RT sent at 12/13/2019 11:29 AM EDT ----- Regarding: Lab Orders for Monday 11.1.2021 Please place lab orders for Monday 11.1.2021, office visit for physical on Friday 11.5.2021 Thank you, Dyke Maes RT(R)

## 2019-12-25 ENCOUNTER — Ambulatory Visit (INDEPENDENT_AMBULATORY_CARE_PROVIDER_SITE_OTHER): Payer: 59

## 2019-12-25 ENCOUNTER — Other Ambulatory Visit (INDEPENDENT_AMBULATORY_CARE_PROVIDER_SITE_OTHER): Payer: 59

## 2019-12-25 ENCOUNTER — Other Ambulatory Visit: Payer: Self-pay

## 2019-12-25 DIAGNOSIS — I10 Essential (primary) hypertension: Secondary | ICD-10-CM | POA: Diagnosis not present

## 2019-12-25 DIAGNOSIS — E039 Hypothyroidism, unspecified: Secondary | ICD-10-CM

## 2019-12-25 DIAGNOSIS — Z23 Encounter for immunization: Secondary | ICD-10-CM | POA: Diagnosis not present

## 2019-12-25 LAB — COMPREHENSIVE METABOLIC PANEL
ALT: 22 U/L (ref 0–35)
AST: 13 U/L (ref 0–37)
Albumin: 4 g/dL (ref 3.5–5.2)
Alkaline Phosphatase: 140 U/L — ABNORMAL HIGH (ref 39–117)
BUN: 15 mg/dL (ref 6–23)
CO2: 31 mEq/L (ref 19–32)
Calcium: 9.4 mg/dL (ref 8.4–10.5)
Chloride: 104 mEq/L (ref 96–112)
Creatinine, Ser: 0.83 mg/dL (ref 0.40–1.20)
GFR: 82.09 mL/min (ref >60.00)
Glucose, Bld: 90 mg/dL (ref 70–99)
Potassium: 4.2 mEq/L (ref 3.5–5.1)
Sodium: 143 mEq/L (ref 135–145)
Total Bilirubin: 0.4 mg/dL (ref 0.2–1.2)
Total Protein: 6.7 g/dL (ref 6.0–8.3)

## 2019-12-25 LAB — LIPID PANEL
Cholesterol: 183 mg/dL (ref 0–200)
HDL: 37.7 mg/dL — ABNORMAL LOW (ref >39.00)
NonHDL: 144.84
Total CHOL/HDL Ratio: 5
Triglycerides: 208 mg/dL — ABNORMAL HIGH (ref 0.0–149.0)
VLDL: 41.6 mg/dL — ABNORMAL HIGH (ref 0.0–40.0)

## 2019-12-25 LAB — T4, FREE: Free T4: 0.75 ng/dL (ref 0.60–1.60)

## 2019-12-25 LAB — HEMOGLOBIN A1C: Hgb A1c MFr Bld: 5.8 % (ref 4.6–6.5)

## 2019-12-25 LAB — LDL CHOLESTEROL, DIRECT: Direct LDL: 131 mg/dL

## 2019-12-25 LAB — TSH: TSH: 2.67 u[IU]/mL (ref 0.35–4.50)

## 2019-12-25 LAB — T3, FREE: T3, Free: 3.5 pg/mL (ref 2.3–4.2)

## 2019-12-25 NOTE — Progress Notes (Signed)
No critical labs need to be addressed urgently. We will discuss labs in detail at upcoming office visit.   

## 2019-12-29 ENCOUNTER — Encounter: Payer: Self-pay | Admitting: Family Medicine

## 2019-12-29 ENCOUNTER — Ambulatory Visit (INDEPENDENT_AMBULATORY_CARE_PROVIDER_SITE_OTHER): Payer: 59 | Admitting: Family Medicine

## 2019-12-29 ENCOUNTER — Other Ambulatory Visit: Payer: Self-pay

## 2019-12-29 ENCOUNTER — Other Ambulatory Visit (HOSPITAL_COMMUNITY)
Admission: RE | Admit: 2019-12-29 | Discharge: 2019-12-29 | Disposition: A | Discharge status: Home / Self Care | Payer: 59 | Source: Ambulatory Visit | Attending: Family Medicine | Admitting: Family Medicine

## 2019-12-29 VITALS — BP 110/80 | HR 103 | Temp 97.1°F | Ht 64.76 in | Wt 298.5 lb

## 2019-12-29 DIAGNOSIS — Z6841 Body Mass Index (BMI) 40.0 and over, adult: Secondary | ICD-10-CM | POA: Diagnosis not present

## 2019-12-29 DIAGNOSIS — F3340 Major depressive disorder, recurrent, in remission, unspecified: Secondary | ICD-10-CM | POA: Diagnosis not present

## 2019-12-29 DIAGNOSIS — F330 Major depressive disorder, recurrent, mild: Secondary | ICD-10-CM

## 2019-12-29 DIAGNOSIS — Z1211 Encounter for screening for malignant neoplasm of colon: Secondary | ICD-10-CM

## 2019-12-29 DIAGNOSIS — Z0000 Encounter for general adult medical examination without abnormal findings: Secondary | ICD-10-CM | POA: Insufficient documentation

## 2019-12-29 DIAGNOSIS — I10 Essential (primary) hypertension: Secondary | ICD-10-CM

## 2019-12-29 DIAGNOSIS — E039 Hypothyroidism, unspecified: Secondary | ICD-10-CM | POA: Diagnosis not present

## 2019-12-29 NOTE — Progress Notes (Signed)
Chief Complaint  Patient presents with  . Annual Exam    History of Present Illness: HPI  The patient is here for annual wellness exam and preventative care.    Hypothyroid : stable control on levothyroxine 25 Lab Results  Component Value Date   TSH 2.67 12/25/2019   MDD: stable control on no meds.   Office Visit from 12/29/2019 in [place] at [facility] Total Score 2       Hypertension:   At goal on losartan BP Readings from Last 3 Encounters:  12/29/19 110/80  12/12/19 138/86  01/13/19 120/82  Using medication without problems or lightheadedness:  none Chest pain with exertion: none Short of breath: stable deconditioning Edema:none Average home BPs: Other issues:  Morbid obesity Body mass index is 50.04 kg/m.  On Trulicity for weight management .Marland Kitchen no SE, no abd pain.  Has tried to increase water, has stopped soda.  No exercise. Has lost 5 lbs so far.  She has noted some decreased appetite. Wt Readings from Last 3 Encounters:  12/29/19 298 lb 8 oz (135.4 kg)  12/12/19 (!) 303 lb 8 oz (137.7 kg)  01/13/19 293 lb 4 oz (133 kg)     Has been eating more butter. The 10-year ASCVD risk score Mikey Bussing DC Brooke Bonito., et al., 2013) is: 5%   Values used to calculate the score:     Age: 49 years     Sex: Female     Is Non-Hispanic African American: No     Diabetic: No     Tobacco smoker: Yes     Systolic Blood Pressure: 229 mmHg     Is BP treated: Yes     HDL Cholesterol: 37.7 mg/dL     Total Cholesterol: 183 mg/dL   This visit occurred during the SARS-CoV-2 public health emergency.  Safety protocols were in place, including screening questions prior to the visit, additional usage of staff PPE, and extensive cleaning of exam room while observing appropriate contact time as indicated for disinfecting solutions.   COVID 19 screen:  No recent travel or known exposure to COVID19 The patient denies respiratory symptoms of COVID 19 at this time. The  importance of social distancing was discussed today.     Review of Systems  Constitutional: Negative for chills and fever.  HENT: Negative for congestion and ear pain.   Eyes: Negative for pain and redness.  Respiratory: Negative for cough and shortness of breath.   Cardiovascular: Negative for chest pain, palpitations and leg swelling.  Gastrointestinal: Negative for abdominal pain, blood in stool, constipation, diarrhea, nausea and vomiting.  Genitourinary: Negative for dysuria.  Musculoskeletal: Negative for falls and myalgias.  Skin: Negative for rash.  Neurological: Negative for dizziness.  Psychiatric/Behavioral: Negative for depression. The patient is not nervous/anxious.       Past Medical History:  Diagnosis Date  . Abrasion of right wrist 04/15/2012  . Arthritis    ankles  . Cough 04/15/2012  . Dental crowns present   . GERD (gastroesophageal reflux disease)   . Hypothyroidism   . Perimenopausal 03/2012   irregular periods  . Retained orthopedic hardware 03/2012   left ankle  . Stuffy and runny nose    clear drainage from nose - URI onset 03/30/2012    reports that she has been smoking cigarettes. She has smoked for the past 10.00 years. She has never used smokeless tobacco. She reports current alcohol use. She reports that she does not use drugs.  Current Outpatient Medications:  .  Dulaglutide (TRULICITY) 1.61 WR/6.0AV SOPN, Inject 0.75 mg into the skin once a week., Disp: 3 mL, Rfl: 5 .  levothyroxine (SYNTHROID) 25 MCG tablet, Take 1 tablet (25 mcg total) by mouth daily., Disp: 90 tablet, Rfl: 3 .  losartan-hydrochlorothiazide (HYZAAR) 50-12.5 MG tablet, Take 1 tablet by mouth daily., Disp: 90 tablet, Rfl: 3 .  nystatin (MYCOSTATIN/NYSTOP) powder, Apply topically 4 (four) times daily., Disp: 15 g, Rfl: 2 .  omeprazole (PRILOSEC) 20 MG capsule, Take 1 capsule (20 mg total) by mouth daily., Disp: 90 capsule, Rfl: 1 .  triamcinolone cream (KENALOG) 0.1 %, Apply  topically 2 (two) times daily., Disp: , Rfl:    Observations/Objective: Blood pressure 110/80, pulse (!) 103, temperature (!) 97.1 F (36.2 C), height 5' 4.76" (1.645 m), weight 298 lb 8 oz (135.4 kg), SpO2 93 %.  Physical Exam Constitutional:      General: She is not in acute distress.Vital signs are normal.     Appearance: Normal appearance. She is well-developed and well-nourished. She is obese. She is not ill-appearing or toxic-appearing.  HENT:     Head: Normocephalic.     Right Ear: Hearing, tympanic membrane, ear canal and external ear normal.     Left Ear: Hearing, tympanic membrane, ear canal and external ear normal.     Nose: Nose normal.  Eyes:     General: Lids are normal. Lids are everted, no foreign bodies appreciated.     Extraocular Movements: EOM normal.     Conjunctiva/sclera: Conjunctivae normal.     Pupils: Pupils are equal, round, and reactive to light.  Neck:     Thyroid: No thyroid mass or thyromegaly.     Vascular: No carotid bruit.     Trachea: Trachea normal.  Cardiovascular:     Rate and Rhythm: Normal rate and regular rhythm.     Pulses: Intact distal pulses.     Heart sounds: Normal heart sounds, S1 normal and S2 normal. No murmur heard. No gallop.   Pulmonary:     Effort: Pulmonary effort is normal. No respiratory distress.     Breath sounds: Normal breath sounds. No wheezing, rhonchi or rales.  Chest:  Breasts: No discharge from either breast. No tenderness and bleeding.    Abdominal:     General: Bowel sounds are normal. There is no distension or abdominal bruit.     Palpations: Abdomen is soft. There is no fluid wave, hepatosplenomegaly or mass.     Tenderness: There is no abdominal tenderness. There is no CVA tenderness, guarding or rebound.     Hernia: No hernia is present.  Genitourinary:    Exam position: Supine.     Labia:        Right: No rash, tenderness or lesion.        Left: No rash, tenderness or lesion.      Vagina: Normal.      Cervix: No cervical motion tenderness, discharge or friability.     Uterus: Normal. Not enlarged and not tender.      Adnexa:        Right: No mass, tenderness or fullness.         Left: No mass, tenderness or fullness.    Musculoskeletal:     Cervical back: Normal range of motion and neck supple.  Lymphadenopathy:     Cervical: No cervical adenopathy.     Upper Body:  No axillary adenopathy present. Skin:    General: Skin is  warm, dry and intact.     Findings: No rash.  Neurological:     Mental Status: She is alert.     Cranial Nerves: No cranial nerve deficit.     Sensory: No sensory deficit.     Deep Tendon Reflexes: Strength normal.  Psychiatric:        Mood and Affect: Mood is not anxious or depressed.        Speech: Speech normal.        Behavior: Behavior normal. Behavior is cooperative.        Cognition and Memory: Cognition and memory normal.        Judgment: Judgment normal.      Assessment and Plan   The patient's preventative maintenance and recommended screening tests for an annual wellness exam were reviewed in full today. Brought up to date unless services declined.  Counselled on the importance of diet, exercise, and its role in overall health and mortality. The patient's FH and SH was reviewed, including their home life, tobacco status, and drug and alcohol status.   Vaccines: S/P COVID, flu, TD.  Consider shingrix. Pap/DVE:  2016.. due. Mammo:  scheduled Bone Density:Colon: Will set up colonscopy.  Smoking Status: current smoker 2 a day.. ETOH/ drug use:  Rare/none  Hep C: will plan to do next year  HIV screen:    Problem List Items Addressed This Visit    BMI 50.0-59.9, adult (HCC) (Chronic)    Currently treated with Trulicity 7.42 mg weekly      Class 3 severe obesity due to excess calories with serious comorbidity and body mass index (BMI) of 50.0 to 59.9 in adult [facility]) (Chronic)    Encouraged exercise, weight loss, healthy eating  habits.  Call or MyChart if interested in increasing the Trulicity.  Work adding exercise ans activity to lifestyle.        Essential hypertension (Chronic)    Stable, chronic.  Continue current medication.  LosartanHCTZ  50/12.5 mg daily      Hypothyroidism (Chronic)    Stable, chronic.  Continue current medication.  levothyroxine  25 mcg po daily      MDD (recurrent major depressive disorder) in remission (HCC) (Chronic)    Stable, chronic.  Well controlled on no medication.          Other Visit Diagnoses    Routine general medical examination at a health care facility    -  Primary   Relevant Orders   Cytology - PAP (Completed)   Colon cancer screening       Relevant Orders   Ambulatory referral to Gastroenterology        Eliezer Lofts, MD

## 2019-12-29 NOTE — Patient Instructions (Addendum)
Call or MyChart if interested in increasing the Trulicity.  Work adding exercise ans activity to lifestyle.  Consider shingrix. Call to make counseling referral.

## 2020-01-02 LAB — CYTOLOGY - PAP
Comment: NEGATIVE
Diagnosis: NEGATIVE
High risk HPV: NEGATIVE

## 2020-01-05 ENCOUNTER — Encounter: Payer: Self-pay | Admitting: *Deleted

## 2020-01-10 ENCOUNTER — Other Ambulatory Visit: Payer: Self-pay | Admitting: Family Medicine

## 2020-01-10 MED ORDER — TRULICITY 1.5 MG/0.5ML ~~LOC~~ SOAJ: 1.5000 mg | SUBCUTANEOUS | 11 refills | Status: DC

## 2020-01-10 MED FILL — TRULICITY 1.5 MG/0.5 ML PEN: 1.5 | 28 days supply | Qty: 2 | Fill #0

## 2020-01-11 MED FILL — NYSTATIN 100,000 UNIT/GM PO: 100000 | 7 days supply | Qty: 15 | Fill #1

## 2020-01-22 ENCOUNTER — Other Ambulatory Visit: Payer: Self-pay | Admitting: Family Medicine

## 2020-01-22 DIAGNOSIS — Z1231 Encounter for screening mammogram for malignant neoplasm of breast: Secondary | ICD-10-CM

## 2020-01-23 ENCOUNTER — Other Ambulatory Visit: Payer: Self-pay

## 2020-01-23 ENCOUNTER — Encounter: Payer: 59 | Attending: Family Medicine | Admitting: Dietician

## 2020-01-23 ENCOUNTER — Encounter: Payer: Self-pay | Admitting: Dietician

## 2020-01-23 DIAGNOSIS — Z6841 Body Mass Index (BMI) 40.0 and over, adult: Secondary | ICD-10-CM | POA: Diagnosis not present

## 2020-01-23 NOTE — Patient Instructions (Signed)
   Aim to eat every 4-5 hours.   For breakfast, consider something small/snack-sized to grab on the go or something liquid such as a smoothie, shake, or protein water.   For meals, aim to include 3 things: vegetables, carbohydrates, and protein. Consider having frozen veggies or packets of tuna on hand for quick options.   Use the balanced snack handout for ideas of foods to have with you at work, such as peanut butter crackers, trail mix, cheese, fruit, yogurt, or protein bars.

## 2020-01-23 NOTE — Progress Notes (Signed)
Medical Nutrition Therapy   Primary concerns today: weight management   Referral diagnosis: E66.01- obesity Preferred learning style: no preference indicated Learning readiness: ready   NUTRITION ASSESSMENT   Anthropometrics  Weight: 299 lbs    Clinical Medical Hx: obesity, HTN, hypothyroidism< GERD Medications: Trulicity, Synthroid, Hyzaar, Prilosec  Notable Signs/Symptoms: none reported   Lifestyle & Dietary Hx Patient arrives to appointment today by herself. Works night shift as an Games developer. States she would like to lose weight. States she has been on Trulicity for 6 weeks now and recently increased her dosage over the past 2 weeks, states this helped decrease her appetite further. States she has been eating out frequently due to not wanting to cook or because she gets tired of what she does cook at home. Typical meal pattern is maybe 1 meal per day and maybe some snacks. States her snack choices are "not good." May have fruit as her side when she eats out instead of chips, and may order salmon with vegetables to eat at a restaurant. States she does not drink sodas but will have coffee and some seltzer water throughout the day. States she used to eat more than just 1 meal per day, but got out of this routine when COVID hit. Also states she used to do yoga twice per week but has not gotten back into it.   Estimated daily fluid intake: 16-32 oz Supplements: women's MVI, vitamin C, elderberry  Sleep: gets adequate sleep 8-9 hours Stress / self-care: high stress job Current average weekly physical activity: N/A  24-Hr Dietary Recall First Meal: - Snack: - Second Meal: [employer] BBQ chicken sandwich + chips  Snack: - Third Meal: - Snack: granola squares  Beverages: coffee, seltzer water   NUTRITION DIAGNOSIS  Overweight/obesity ([place]-3.3) related to decreased physical activity and poor diet as evidenced by patient reported exercise and dietary recall and BMI of 50  kg/m.   NUTRITION INTERVENTION  Nutrition education (E-1) on the following topics:   General balanced eating for sustained energy and weight management: ideas for balanced meals and snacks which focus on lean protein and fiber, importance of eating at least every 4-5 hours, and tips for portion control and following hunger/fullness cues.   Handouts Provided Include   Breakfast Ideas  MyPlate & Meal Ideas  Balanced Snacks   Learning Style & Readiness for Change Teaching method utilized: Visual & Auditory  Demonstrated degree of understanding via: Teach Back  Barriers to learning/adherence to lifestyle change: None Identified   Goals  Aim to eat every 4-5 hours.   For breakfast, consider something small/snack-sized to grab on the go or something liquid such as a smoothie, shake, or protein water.   For meals, aim to include 3 things: vegetables, carbohydrates, and protein. Consider having frozen veggies or packets of tuna on hand for quick options.   Use the balanced snack handout for ideas of foods to have with you at work, such as peanut butter crackers, trail mix, cheese, fruit, yogurt, or protein bars.    MONITORING & EVALUATION Dietary intake, weekly physical activity, and goals PRN.  Next Steps  Patient is to contact [facility] to schedule follow up visit as needed.

## 2020-01-31 ENCOUNTER — Ambulatory Visit
Admission: RE | Admit: 2020-01-31 | Discharge: 2020-01-31 | Disposition: A | Discharge status: Home / Self Care | Payer: 59 | Source: Ambulatory Visit

## 2020-01-31 ENCOUNTER — Other Ambulatory Visit: Payer: Self-pay

## 2020-01-31 DIAGNOSIS — Z1231 Encounter for screening mammogram for malignant neoplasm of breast: Secondary | ICD-10-CM | POA: Diagnosis not present

## 2020-02-04 ENCOUNTER — Emergency Department (HOSPITAL_BASED_OUTPATIENT_CLINIC_OR_DEPARTMENT_OTHER)
Admission: EM | Admit: 2020-02-04 | Discharge: 2020-02-05 | Disposition: A | Discharge status: Home / Self Care | Payer: 59 | Attending: Emergency Medicine | Admitting: Emergency Medicine

## 2020-02-04 ENCOUNTER — Other Ambulatory Visit: Payer: Self-pay

## 2020-02-04 DIAGNOSIS — Z79899 Other long term (current) drug therapy: Secondary | ICD-10-CM | POA: Insufficient documentation

## 2020-02-04 DIAGNOSIS — K625 Hemorrhage of anus and rectum: Secondary | ICD-10-CM | POA: Insufficient documentation

## 2020-02-04 DIAGNOSIS — K921 Melena: Secondary | ICD-10-CM | POA: Diagnosis not present

## 2020-02-04 DIAGNOSIS — F17210 Nicotine dependence, cigarettes, uncomplicated: Secondary | ICD-10-CM | POA: Insufficient documentation

## 2020-02-04 DIAGNOSIS — I10 Essential (primary) hypertension: Secondary | ICD-10-CM | POA: Diagnosis not present

## 2020-02-04 DIAGNOSIS — K529 Noninfective gastroenteritis and colitis, unspecified: Secondary | ICD-10-CM | POA: Diagnosis not present

## 2020-02-04 DIAGNOSIS — E039 Hypothyroidism, unspecified: Secondary | ICD-10-CM | POA: Diagnosis not present

## 2020-02-04 DIAGNOSIS — R109 Unspecified abdominal pain: Secondary | ICD-10-CM | POA: Diagnosis present

## 2020-02-05 ENCOUNTER — Encounter (HOSPITAL_BASED_OUTPATIENT_CLINIC_OR_DEPARTMENT_OTHER): Payer: Self-pay | Admitting: *Deleted

## 2020-02-05 ENCOUNTER — Emergency Department (HOSPITAL_BASED_OUTPATIENT_CLINIC_OR_DEPARTMENT_OTHER): Payer: 59

## 2020-02-05 DIAGNOSIS — K529 Noninfective gastroenteritis and colitis, unspecified: Secondary | ICD-10-CM | POA: Diagnosis not present

## 2020-02-05 DIAGNOSIS — F17210 Nicotine dependence, cigarettes, uncomplicated: Secondary | ICD-10-CM | POA: Diagnosis not present

## 2020-02-05 DIAGNOSIS — I10 Essential (primary) hypertension: Secondary | ICD-10-CM | POA: Diagnosis not present

## 2020-02-05 DIAGNOSIS — K921 Melena: Secondary | ICD-10-CM | POA: Diagnosis not present

## 2020-02-05 DIAGNOSIS — E039 Hypothyroidism, unspecified: Secondary | ICD-10-CM | POA: Diagnosis not present

## 2020-02-05 DIAGNOSIS — K625 Hemorrhage of anus and rectum: Secondary | ICD-10-CM | POA: Diagnosis not present

## 2020-02-05 DIAGNOSIS — Z79899 Other long term (current) drug therapy: Secondary | ICD-10-CM | POA: Diagnosis not present

## 2020-02-05 DIAGNOSIS — R109 Unspecified abdominal pain: Secondary | ICD-10-CM | POA: Diagnosis not present

## 2020-02-05 LAB — COMPREHENSIVE METABOLIC PANEL
ALT: 36 U/L (ref 0–44)
AST: 31 U/L (ref 15–41)
Albumin: 4 g/dL (ref 3.5–5.0)
Alkaline Phosphatase: 122 U/L (ref 38–126)
Anion gap: 12 (ref 5–15)
BUN: 18 mg/dL (ref 6–20)
CO2: 26 mmol/L (ref 22–32)
Calcium: 9.3 mg/dL (ref 8.9–10.3)
Chloride: 100 mmol/L (ref 98–111)
Creatinine, Ser: 1.03 mg/dL — ABNORMAL HIGH (ref 0.44–1.00)
GFR, Estimated: >60 mL/min (ref >60)
Glucose, Bld: 141 mg/dL — ABNORMAL HIGH (ref 70–99)
Potassium: 3.7 mmol/L (ref 3.5–5.1)
Sodium: 138 mmol/L (ref 135–145)
Total Bilirubin: 0.9 mg/dL (ref 0.3–1.2)
Total Protein: 7.5 g/dL (ref 6.5–8.1)

## 2020-02-05 LAB — CBC WITH DIFFERENTIAL/PLATELET
Abs Immature Granulocytes: 0.07 10*3/uL (ref 0.00–0.07)
Basophils Absolute: 0.1 10*3/uL (ref 0.0–0.1)
Basophils Relative: 0 %
Eosinophils Absolute: 0.1 10*3/uL (ref 0.0–0.5)
Eosinophils Relative: 1 %
HCT: 46 % (ref 36.0–46.0)
Hemoglobin: 15.2 g/dL — ABNORMAL HIGH (ref 12.0–15.0)
Immature Granulocytes: 1 %
Lymphocytes Relative: 18 %
Lymphs Abs: 2 10*3/uL (ref 0.7–4.0)
MCH: 29.5 pg (ref 26.0–34.0)
MCHC: 33 g/dL (ref 30.0–36.0)
MCV: 89.1 fL (ref 80.0–100.0)
Monocytes Absolute: 0.5 10*3/uL (ref 0.1–1.0)
Monocytes Relative: 5 %
Neutro Abs: 8.5 10*3/uL — ABNORMAL HIGH (ref 1.7–7.7)
Neutrophils Relative %: 75 %
Platelets: 357 10*3/uL (ref 150–400)
RBC: 5.16 MIL/uL — ABNORMAL HIGH (ref 3.87–5.11)
RDW: 13.2 % (ref 11.5–15.5)
WBC: 11.2 10*3/uL — ABNORMAL HIGH (ref 4.0–10.5)
nRBC: 0 % (ref 0.0–0.2)

## 2020-02-05 LAB — PREGNANCY, URINE: Preg Test, Ur: NEGATIVE

## 2020-02-05 LAB — LIPASE, BLOOD: Lipase: 24 U/L (ref 11–51)

## 2020-02-05 LAB — URINALYSIS, ROUTINE W REFLEX MICROSCOPIC
Bilirubin Urine: NEGATIVE
Glucose, UA: NEGATIVE mg/dL
Hgb urine dipstick: NEGATIVE
Ketones, ur: NEGATIVE mg/dL
Leukocytes,Ua: NEGATIVE
Nitrite: NEGATIVE
Protein, ur: NEGATIVE mg/dL
Specific Gravity, Urine: 1.015 (ref 1.005–1.030)
pH: 7 (ref 5.0–8.0)

## 2020-02-05 MED ORDER — AMOXICILLIN-POT CLAVULANATE 875-125 MG PO TABS: 1.0000 | ORAL_TABLET | Freq: Two times a day (BID) | ORAL | 0 refills | Status: DC

## 2020-02-05 MED ORDER — IOHEXOL 300 MG/ML  SOLN
100.0000 mL | Freq: Once | INTRAMUSCULAR | Status: AC | PRN
  Administered 2020-02-05: 100 mL via INTRAVENOUS

## 2020-02-05 MED ORDER — AMOXICILLIN-POT CLAVULANATE 875-125 MG PO TABS
1.0000 | ORAL_TABLET | Freq: Once | ORAL | Status: AC
  Administered 2020-02-05: 1 via ORAL
  Filled 2020-02-05: qty 1

## 2020-02-05 MED ORDER — ONDANSETRON 4 MG PO TBDP: 4.0000 mg | ORAL_TABLET | Freq: Three times a day (TID) | ORAL | 0 refills | Status: DC | PRN

## 2020-02-05 MED ORDER — SODIUM CHLORIDE 0.9 % IV BOLUS
500.0000 mL | Freq: Once | INTRAVENOUS | Status: AC
  Administered 2020-02-05: 500 mL via INTRAVENOUS

## 2020-02-05 NOTE — Discharge Instructions (Addendum)
You were found to have colitis on your CT scan.  This is likely infectious versus inflammatory.  Take medications as prescribed.  It is important that you follow-up for your screening colonoscopy.  If you develop fevers or worsening symptoms, you should be reevaluated.  Make sure that you are staying hydrated.

## 2020-02-05 NOTE — ED Triage Notes (Addendum)
Rectal bleeding since Wednesday. Reports 2 episodes today. Abd pain also

## 2020-02-05 NOTE — ED Provider Notes (Signed)
[facility] EMERGENCY DEPARTMENT Provider Note   CSN: [id number] Arrival date & time: 02/04/20  2356     History Chief Complaint  Patient presents with  . Rectal Bleeding    Jamie Mckenzie is a 49 y.o. female.  HPI     This is a 49 year old female with a history of thyroid disease and reflux who presents with abdominal pain and rectal bleeding.  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  She states she had onset of rectal bleeding on Wednesday.  She states that she woke up and had some abdominal discomfort.  She had a normal bowel movement but noted gross blood in her bowel movement.  Since that time she has had multiple bowel movements with gross blood.  She is also experienced some abdominal pain.  She states initially it started in her lower abdomen and now is diffuse.  It is crampy in nature.  She currently rates her pain at 2 out of 10.  She has not had any fevers.  She denies any significant NSAID use.  She is not on any anticoagulants.  She has not yet had a screening colonoscopy but is due for one.  She reports nausea and anorexia.  No vomiting.  Past Medical History:  Diagnosis Date  . Abrasion of right wrist 04/15/2012  . Arthritis    ankles  . Cough 04/15/2012  . Dental crowns present   . GERD (gastroesophageal reflux disease)   . Hypothyroidism   . Perimenopausal 03/2012   irregular periods  . Retained orthopedic hardware 03/2012   left ankle  . Stuffy and runny nose    clear drainage from nose - URI onset 03/30/2012    Patient Active Problem List   Diagnosis Date Noted  . BMI 50.0-59.9, adult ([place]) 12/29/2019  . 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 Class 3 severe obesity due to excess calories with serious comorbidity and body mass index (BMI) of 50.0 to 59.9 in adult ([place]) 12/12/2019  . MDD (major depressive disorder), single episode, mild ([place]) 12/12/2019  . Essential hypertension 12/31/2018  . Candidal intertrigo  11/29/2018  . Psoriasis 01/06/2013  . Tobacco abuse 11/10/2011  . Hypothyroidism 05/20/2010  . MDD (major depressive disorder), recurrent episode ([place]) 02/01/2008  . 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 ALLERGIC RHINITIS 02/01/2008  . GERD 02/01/2008  . FIBROCYSTIC BREAST DISEASE 02/01/2008    Past Surgical History:  Procedure Laterality Date  . BREAST BIOPSY Left    Benign  . DILATION AND EVACUATION  11/15/2008  . HARDWARE REMOVAL Left 04/21/2012   Procedure: HARDWARE REMOVAL;  Surgeon: Wylene Simmer, MD;  Location: [facility];  Service: Orthopedics;  Laterality: Left;  LEFT ANKLE HARDWARE REMOVAL  . HYSTEROSCOPY WITH D & C  10/24/2009   with polypectomy  . MULTIPLE TOOTH EXTRACTIONS    . ORIF ANKLE FRACTURE Right 1985  . ORIF ANKLE FRACTURE  11/03/2011   Procedure: OPEN REDUCTION INTERNAL FIXATION (ORIF) ANKLE FRACTURE;  Surgeon: Wylene Simmer, MD;  Location: [facility];  Service: Orthopedics;  Laterality: Left;  Open Reduction Internal Fixation left ankle lateral malleolus fracture, left syndesmosis     OB History    Gravida  1   Para      Term      Preterm      AB      Living        SAB      IAB  Ectopic      Multiple      Live Births              Family History  Problem Relation Age of Onset  . Hypertension Mother   . Arthritis Mother   . Obesity Mother        morbid  . Myasthenia gravis Mother   . Cancer Father        non hodgkins lymphoma  . Cancer Maternal Grandmother        lung  . Heart disease Maternal Grandfather        heart attack  . COPD Paternal Grandfather   . Breast cancer Maternal Aunt     Social History   Tobacco Use  . Smoking status: Light Tobacco Smoker    Years: 10.00    Types: Cigarettes  . Smokeless tobacco: Never Used  . Tobacco comment: 3-4 cig./day  Substance Use Topics  . Alcohol use: Yes    Comment: socially  . Drug use: No    Home Medications Prior to Admission medications    Medication Sig Start Date End Date Taking? Authorizing Provider  amoxicillin-clavulanate (AUGMENTIN) 875-125 MG tablet Take 1 tablet by mouth every 12 (twelve) hours. 02/05/20   Dante Roudebush, Barbette Hair, MD  Dulaglutide (TRULICITY) 1.5 RX/5.4MG SOPN Inject 1.5 mg into the skin once a week. 01/10/20   Bedsole, Amy E, MD  levothyroxine (SYNTHROID) 25 MCG tablet Take 1 tablet (25 mcg total) by mouth daily. 04/18/19   Bedsole, Amy E, MD  losartan-hydrochlorothiazide (HYZAAR) 50-12.5 MG tablet Take 1 tablet by mouth daily. 12/30/18   Bedsole, Amy E, MD  nystatin (MYCOSTATIN/NYSTOP) powder Apply topically 4 (four) times daily. 04/18/19   Bedsole, Amy E, MD  omeprazole (PRILOSEC) 20 MG capsule Take 1 capsule (20 mg total) by mouth daily. 05/21/16   Bedsole, Amy E, MD  ondansetron (ZOFRAN ODT) 4 MG disintegrating tablet Take 1 tablet (4 mg total) by mouth every 8 (eight) hours as needed. 02/05/20   Kiyan Burmester, Barbette Hair, MD  triamcinolone cream (KENALOG) 0.1 % Apply topically 2 (two) times daily. 12/13/19   [provider]    Allergies    Patient has no known allergies.  Review of Systems   Review of Systems  Constitutional: Negative for fever.  Respiratory: Negative for shortness of breath.   Cardiovascular: Negative for chest pain.  Gastrointestinal: Positive for abdominal pain, blood in stool and nausea. Negative for vomiting.  Genitourinary: Negative for dysuria.  Neurological: Negative for light-headedness.  All other systems reviewed and are negative.   Physical Exam Updated Vital Signs BP (!) 153/96 (BP Location: Right Arm)   Pulse (!) 110   Temp 97.6 F (36.4 C) (Oral)   Resp 20   Ht 1.651 m (5\' 5" )   Wt 135.6 kg   SpO2 99%   BMI 49.75 kg/m   Physical Exam Vitals and nursing note reviewed.  Constitutional:      General: She is not in acute distress.    Appearance: She is well-developed and well-nourished. She is obese. She is not toxic-appearing.  HENT:     Head:  Normocephalic and atraumatic.     Nose: Nose normal.     Mouth/Throat:     Mouth: Mucous membranes are dry.  Eyes:     Pupils: Pupils are equal, round, and reactive to light.  Cardiovascular:     Rate and Rhythm: Normal rate and regular rhythm.     Heart sounds: Normal heart sounds.  Pulmonary:     Effort: Pulmonary effort is normal. No respiratory distress.     Breath sounds: No wheezing.  Abdominal:     General: Bowel sounds are normal.     Palpations: Abdomen is soft.     Tenderness: There is abdominal tenderness. There is no guarding or rebound.     Comments: Diffuse tenderness to palpation, no rebound or guarding  Genitourinary:    Comments: Deferred Musculoskeletal:     Comments: Trace bilateral lower extremity edema  Skin:    General: Skin is warm and dry.  Neurological:     Mental Status: She is alert and oriented to person, place, and time.  Psychiatric:        Mood and Affect: Mood and affect and mood normal.     ED Results / Procedures / Treatments   Labs (all labs ordered are listed, but only abnormal results are displayed) Labs Reviewed  CBC WITH DIFFERENTIAL/PLATELET - Abnormal; Notable for the following components:      Result Value   WBC 11.2 (*)    RBC 5.16 (*)    Hemoglobin 15.2 (*)    Neutro Abs 8.5 (*)    All other components within normal limits  COMPREHENSIVE METABOLIC PANEL - Abnormal; Notable for the following components:   Glucose, Bld 141 (*)    Creatinine, Ser 1.03 (*)    All other components within normal limits  LIPASE, BLOOD  URINALYSIS, ROUTINE W REFLEX MICROSCOPIC  PREGNANCY, URINE    EKG None  Radiology CT ABDOMEN PELVIS W CONTRAST  Result Date: 02/05/2020 CLINICAL DATA:  Unspecified abdominal pain, hematochezia EXAM: CT ABDOMEN AND PELVIS WITH CONTRAST TECHNIQUE: Multidetector CT imaging of the abdomen and pelvis was performed using the standard protocol following bolus administration of intravenous contrast. CONTRAST:  164mL  OMNIPAQUE IOHEXOL 300 MG/ML  SOLN COMPARISON:  None. FINDINGS: Lower chest: No acute abnormality. Hepatobiliary: The superior right hepatic dome is excluded from view. No focal liver abnormality is seen within the visualized liver. No gallstones, gallbladder wall thickening, or biliary dilatation. Pancreas: Unremarkable Spleen: Unremarkable Adrenals/Urinary Tract: Adrenal glands are unremarkable. Kidneys are normal in size and position. Simple cortical cyst noted within the right kidney. The kidneys are otherwise unremarkable. Bladder unremarkable. Stomach/Bowel: There is long segment circumferential bowel wall thickening and pericolonic inflammatory stranding involving the descending colon in keeping with mild infectious, inflammatory, or possibly ischemic colitis. There is no pneumatosis, free intraperitoneal gas, or loculated pericolonic fluid collections. No free intraperitoneal fluid. No obstruction. Mild sigmoid diverticulosis. The stomach, small bowel, and large bowel are otherwise unremarkable. Appendix unremarkable. Vascular/Lymphatic: Minimal atherosclerotic calcification is seen within the right common iliac artery. The abdominal vasculature is otherwise unremarkable. Notably, there is no significant atherosclerotic calcification in the region of the mesenteric arterial vasculature ostia. No aortic aneurysm. No pathologic adenopathy within the abdomen and pelvis. Reproductive: Lobulated appearance of the uterine fundus may relate to underlying uterine fibroids. The pelvic organs are otherwise unremarkable. Other: Rectum unremarkable. Musculoskeletal: Degenerative changes are seen within the lumbar spine. No lytic or blastic bone lesions are seen. IMPRESSION: Long segment inflammatory change in keeping with infectious, inflammatory, or ischemic descending colitis. No evidence of obstruction or perforation. Aortic Atherosclerosis (ICD10-I70.0). Electronically Signed   By: Fidela Salisbury MD   On: 02/05/2020  01:59    Procedures Procedures (including critical care time)  Medications Ordered in ED Medications  amoxicillin-clavulanate (AUGMENTIN) 875-125 MG per tablet 1 tablet (has no administration in time range)  sodium chloride  0.9 % bolus 500 mL (0 mLs Intravenous Stopped 02/05/20 0126)  iohexol (OMNIPAQUE) 300 MG/ML solution 100 mL (100 mLs Intravenous Contrast Given 02/05/20 3235)    ED Course  I have reviewed the triage vital signs and the nursing notes.  Pertinent labs & imaging results that were available during my care of the patient were reviewed by me and considered in my medical decision making (see chart for details).    MDM Rules/Calculators/A&P                          Patient presents with abdominal pain or bleeding.  She is nontoxic-appearing and vital signs are reassuring.  She has some diffuse tenderness on exam.  Considerations include but not limited to, diverticulitis, colitis, mass.  Patient was given fluids.  Labs obtained.  Labs are indicative of likely some dehydration as she appears hemoconcentrated and her creatinine is slightly elevated.  She was given fluids.  She has a slight leukocytosis with a left shift.  Given this some tenderness, will obtain CT scan.  CT scan is consistent with colitis.  Doubt ischemic colitis.  Favor infectious.  Will start on Augmentin.  I have encouraged her to follow-up as scheduled for her screening colonoscopy.  She was also given return precautions.  After history, exam, and medical workup I feel the patient has been appropriately medically screened and is safe for discharge home. Pertinent diagnoses were discussed with the patient. Patient was given return precautions.  Final Clinical Impression(s) / ED Diagnoses Final diagnoses:  Colitis  Rectal bleeding    Rx / DC Orders ED Discharge Orders         Ordered    amoxicillin-clavulanate (AUGMENTIN) 875-125 MG tablet  Every 12 hours        02/05/20 0216    ondansetron (ZOFRAN  ODT) 4 MG disintegrating tablet  Every 8 hours PRN        02/05/20 0216           Merryl Hacker, MD 02/05/20 [phone]

## 2020-02-15 MED FILL — TRULICITY 1.5 MG/0.5 ML PEN: 1.5 | 28 days supply | Qty: 2 | Fill #1

## 2020-02-19 DIAGNOSIS — J069 Acute upper respiratory infection, unspecified: Secondary | ICD-10-CM | POA: Diagnosis not present

## 2020-03-11 NOTE — Assessment & Plan Note (Addendum)
Encouraged exercise, weight loss, healthy eating habits.  Call or MyChart if interested in increasing the Trulicity.  Work adding exercise ans activity to lifestyle.

## 2020-03-11 NOTE — Assessment & Plan Note (Signed)
Stable, chronic.  Continue current medication. ? ? ?Losartan HCTZ 50/12.5 mg daily. ?

## 2020-03-11 NOTE — Assessment & Plan Note (Signed)
Currently treated with Trulicity 4.03 mg weekly

## 2020-03-11 NOTE — Assessment & Plan Note (Signed)
Stable, chronic.  Continue current medication.  levothyroxine  25 mcg po daily

## 2020-03-11 NOTE — Assessment & Plan Note (Signed)
Stable, chronic.  Well controlled on no medication.

## 2020-03-24 DIAGNOSIS — Z20822 Contact with and (suspected) exposure to covid-19: Secondary | ICD-10-CM | POA: Diagnosis not present

## 2020-03-25 MED FILL — LEVOTHYROXINE SODIUM 25 MCG: 25 | 90 days supply | Qty: 90 | Fill #2

## 2020-04-30 ENCOUNTER — Other Ambulatory Visit: Payer: Self-pay | Admitting: Family Medicine

## 2020-04-30 MED FILL — LOSARTAN-HCTZ 50-12.5 MG TA: 50-12.5 | 30 days supply | Qty: 30 | Fill #0

## 2020-06-12 ENCOUNTER — Other Ambulatory Visit (HOSPITAL_COMMUNITY): Payer: Self-pay

## 2020-06-12 MED FILL — Losartan Potassium & Hydrochlorothiazide Tab 50-12.5 MG: ORAL | 30 days supply | Qty: 30 | Fill #0 | Status: AC

## 2020-07-13 ENCOUNTER — Other Ambulatory Visit: Payer: Self-pay

## 2020-07-13 ENCOUNTER — Emergency Department (HOSPITAL_BASED_OUTPATIENT_CLINIC_OR_DEPARTMENT_OTHER): Payer: 59

## 2020-07-13 ENCOUNTER — Emergency Department (HOSPITAL_BASED_OUTPATIENT_CLINIC_OR_DEPARTMENT_OTHER)
Admission: EM | Admit: 2020-07-13 | Discharge: 2020-07-13 | Disposition: A | Discharge status: Home / Self Care | Payer: 59 | Attending: Emergency Medicine | Admitting: Emergency Medicine

## 2020-07-13 ENCOUNTER — Encounter (HOSPITAL_BASED_OUTPATIENT_CLINIC_OR_DEPARTMENT_OTHER): Payer: Self-pay | Admitting: Emergency Medicine

## 2020-07-13 DIAGNOSIS — F17210 Nicotine dependence, cigarettes, uncomplicated: Secondary | ICD-10-CM | POA: Insufficient documentation

## 2020-07-13 DIAGNOSIS — E039 Hypothyroidism, unspecified: Secondary | ICD-10-CM | POA: Diagnosis not present

## 2020-07-13 DIAGNOSIS — Z8616 Personal history of COVID-19: Secondary | ICD-10-CM | POA: Insufficient documentation

## 2020-07-13 DIAGNOSIS — I10 Essential (primary) hypertension: Secondary | ICD-10-CM | POA: Diagnosis not present

## 2020-07-13 DIAGNOSIS — R0602 Shortness of breath: Secondary | ICD-10-CM | POA: Diagnosis not present

## 2020-07-13 DIAGNOSIS — Z79899 Other long term (current) drug therapy: Secondary | ICD-10-CM | POA: Diagnosis not present

## 2020-07-13 DIAGNOSIS — R0609 Other forms of dyspnea: Secondary | ICD-10-CM

## 2020-07-13 DIAGNOSIS — R0600 Dyspnea, unspecified: Secondary | ICD-10-CM

## 2020-07-13 DIAGNOSIS — R091 Pleurisy: Secondary | ICD-10-CM | POA: Insufficient documentation

## 2020-07-13 LAB — BASIC METABOLIC PANEL
Anion gap: 10 (ref 5–15)
BUN: 22 mg/dL — ABNORMAL HIGH (ref 6–20)
CO2: 29 mmol/L (ref 22–32)
Calcium: 9.6 mg/dL (ref 8.9–10.3)
Chloride: 100 mmol/L (ref 98–111)
Creatinine, Ser: 1.02 mg/dL — ABNORMAL HIGH (ref 0.44–1.00)
GFR, Estimated: >60 mL/min (ref >60)
Glucose, Bld: 89 mg/dL (ref 70–99)
Potassium: 3.7 mmol/L (ref 3.5–5.1)
Sodium: 139 mmol/L (ref 135–145)

## 2020-07-13 LAB — CBC WITH DIFFERENTIAL/PLATELET
Abs Immature Granulocytes: 0.04 10*3/uL (ref 0.00–0.07)
Basophils Absolute: 0 10*3/uL (ref 0.0–0.1)
Basophils Relative: 0 %
Eosinophils Absolute: 0.2 10*3/uL (ref 0.0–0.5)
Eosinophils Relative: 1 %
HCT: 44.7 % (ref 36.0–46.0)
Hemoglobin: 14.5 g/dL (ref 12.0–15.0)
Immature Granulocytes: 0 %
Lymphocytes Relative: 30 %
Lymphs Abs: 3.5 10*3/uL (ref 0.7–4.0)
MCH: 29.4 pg (ref 26.0–34.0)
MCHC: 32.4 g/dL (ref 30.0–36.0)
MCV: 90.5 fL (ref 80.0–100.0)
Monocytes Absolute: 0.8 10*3/uL (ref 0.1–1.0)
Monocytes Relative: 7 %
Neutro Abs: 7.2 10*3/uL (ref 1.7–7.7)
Neutrophils Relative %: 62 %
Platelets: 361 10*3/uL (ref 150–400)
RBC: 4.94 MIL/uL (ref 3.87–5.11)
RDW: 13.6 % (ref 11.5–15.5)
WBC: 11.6 10*3/uL — ABNORMAL HIGH (ref 4.0–10.5)
nRBC: 0 % (ref 0.0–0.2)

## 2020-07-13 LAB — BRAIN NATRIURETIC PEPTIDE: B Natriuretic Peptide: 25.2 pg/mL (ref 0.0–100.0)

## 2020-07-13 LAB — PREGNANCY, URINE: Preg Test, Ur: NEGATIVE

## 2020-07-13 LAB — TROPONIN I (HIGH SENSITIVITY)
Troponin I (High Sensitivity): <2 ng/L (ref <18)
Troponin I (High Sensitivity): <2 ng/L (ref <18)

## 2020-07-13 MED ORDER — ASPIRIN 81 MG PO CHEW
324.0000 mg | CHEWABLE_TABLET | Freq: Once | ORAL | Status: AC
  Administered 2020-07-13: 324 mg via ORAL
  Filled 2020-07-13: qty 4

## 2020-07-13 MED ORDER — ALBUTEROL SULFATE HFA 108 (90 BASE) MCG/ACT IN AERS
2.0000 | INHALATION_SPRAY | Freq: Once | RESPIRATORY_TRACT | Status: AC
  Administered 2020-07-13: 2 via RESPIRATORY_TRACT
  Filled 2020-07-13: qty 6.7

## 2020-07-13 MED ORDER — IOHEXOL 350 MG/ML SOLN
100.0000 mL | Freq: Once | INTRAVENOUS | Status: AC | PRN
  Administered 2020-07-13: 100 mL via INTRAVENOUS

## 2020-07-13 NOTE — ED Triage Notes (Signed)
Pt reports Sob for the past two days that has gotten progressively worse. Pt also reports constant back pain in addition to pain with deep breaths.

## 2020-07-13 NOTE — ED Notes (Signed)
Patient transported to CT 

## 2020-07-13 NOTE — Discharge Instructions (Signed)
You are seen in the ER for shortness of breath. First troponin, BNP are normal . CT scan is not showing any pulmonary embolus.  It is unclear why you are having pleuritic pain. Take anti-inflammatory medication every 8 hours for the next 3 days.  Return to the ER if you start having severe shortness of breath, severe chest pain, near fainting spell.

## 2020-07-13 NOTE — ED Provider Notes (Addendum)
[facility] EMERGENCY DEPT Provider Note   CSN: [id number] Arrival date & time: 07/13/20  0746     History Chief Complaint  Patient presents with  . Shortness of Breath    Jamie Mckenzie is a 50 y.o. female.  HPI     50 year old female comes in w/ chief complaint of shortness of breath. She has history of GERD, did acquire COVID-19 earlier this month.    Jamie Mckenzie reports that she has been having some back pain the last 2 or 3 days.  The pain has moved towards the midline.  Pain is worse with deep inspiration.  Patient is also having some shortness of breath that started developing yesterday.  Shortness of breath is exertional.  While at work, she broke out of sweat and her shortness of breath was more pronounced.  She also indicates that she had driven to [place] for a week, returned sometime last week.  Pt has no hx of PE, DVT.  She denies any cardiac history, and although she feels intermittently like she has a lump in her chest, there is no chest pain.  Her symptoms are not similar to GERD.  Past Medical History:  Diagnosis Date  . Abrasion of right wrist 04/15/2012  . Arthritis    ankles  . Cough 04/15/2012  . Dental crowns present   . GERD (gastroesophageal reflux disease)   . Hypothyroidism   . Perimenopausal 03/2012   irregular periods  . Retained orthopedic hardware 03/2012   left ankle  . Stuffy and runny nose    clear drainage from nose - URI onset 03/30/2012    Patient Active Problem List   Diagnosis Date Noted  . BMI 50.0-59.9, adult ([place]) 12/29/2019  . 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 Class 3 severe obesity due to excess calories with serious comorbidity and body mass index (BMI) of 50.0 to 59.9 in adult ([place]) 12/12/2019  . MDD (major depressive disorder), single episode, mild ([place]) 12/12/2019  . Essential hypertension 12/31/2018  . Candidal intertrigo 11/29/2018  . Psoriasis 01/06/2013  . Tobacco abuse 11/10/2011  .  Hypothyroidism 05/20/2010  . MDD (recurrent major depressive disorder) in remission ([facility]) 02/01/2008  . 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 ALLERGIC RHINITIS 02/01/2008  . GERD 02/01/2008  . FIBROCYSTIC BREAST DISEASE 02/01/2008    Past Surgical History:  Procedure Laterality Date  . BREAST BIOPSY Left    Benign  . DILATION AND EVACUATION  11/15/2008  . HARDWARE REMOVAL Left 04/21/2012   Procedure: HARDWARE REMOVAL;  Surgeon: Wylene Simmer, MD;  Location: [facility];  Service: Orthopedics;  Laterality: Left;  LEFT ANKLE HARDWARE REMOVAL  . HYSTEROSCOPY WITH D & C  10/24/2009   with polypectomy  . MULTIPLE TOOTH EXTRACTIONS    . ORIF ANKLE FRACTURE Right 1985  . ORIF ANKLE FRACTURE  11/03/2011   Procedure: OPEN REDUCTION INTERNAL FIXATION (ORIF) ANKLE FRACTURE;  Surgeon: Wylene Simmer, MD;  Location: [facility];  Service: Orthopedics;  Laterality: Left;  Open Reduction Internal Fixation left ankle lateral malleolus fracture, left syndesmosis     OB History    Gravida  1   Para      Term      Preterm      AB      Living        SAB      IAB      Ectopic      Multiple      Live Births  Family History  Problem Relation Age of Onset  . Hypertension Mother   . Arthritis Mother   . Obesity Mother        morbid  . Myasthenia gravis Mother   . Cancer Father        non hodgkins lymphoma  . Cancer Maternal Grandmother        lung  . Heart disease Maternal Grandfather        heart attack  . COPD Paternal Grandfather   . Breast cancer Maternal Aunt     Social History   Tobacco Use  . Smoking status: Light Tobacco Smoker    Years: 10.00    Types: Cigarettes  . Smokeless tobacco: Never Used  . Tobacco comment: 3-4 cig./day  Substance Use Topics  . Alcohol use: Yes    Comment: socially  . Drug use: No    Home Medications Prior to Admission medications   Medication Sig Start Date End Date Taking? Authorizing Provider   amoxicillin-clavulanate (AUGMENTIN) 875-125 MG tablet Take 1 tablet by mouth every 12 (twelve) hours. 02/05/20   Horton, Barbette Hair, MD  Dulaglutide 1.5 MG/0.5ML SOPN INJECT 1.5 MG INTO THE SKIN ONCE A WEEK. 01/10/20 01/09/21  Bedsole, Amy E, MD  levothyroxine (SYNTHROID) 25 MCG tablet TAKE 1 TABLET (25 MCG TOTAL) BY MOUTH DAILY. 04/18/19 04/17/20  Bedsole, Amy E, MD  losartan-hydrochlorothiazide (HYZAAR) 50-12.5 MG tablet TAKE 1 TABLET BY MOUTH DAILY. 04/30/20 04/30/21  Bedsole, Amy E, MD  nystatin (MYCOSTATIN/NYSTOP) powder Apply topically 4 (four) times daily. 04/18/19   Bedsole, Amy E, MD  omeprazole (PRILOSEC) 20 MG capsule Take 1 capsule (20 mg total) by mouth daily. 05/21/16   Bedsole, Amy E, MD  ondansetron (ZOFRAN ODT) 4 MG disintegrating tablet Take 1 tablet (4 mg total) by mouth every 8 (eight) hours as needed. 02/05/20   Horton, Barbette Hair, MD  triamcinolone (KENALOG) 0.1 % APPLY TOPICALLY TWICE A DAY 12/12/19 12/11/20  Larene Pickett, PA-C  triamcinolone cream (KENALOG) 0.1 % Apply topically 2 (two) times daily. 12/13/19   [provider]    Allergies    Patient has no allergy information on record.  Review of Systems   Review of Systems  Constitutional: Positive for activity change and diaphoresis.  Respiratory: Positive for shortness of breath.   Cardiovascular: Negative for chest pain.  Gastrointestinal: Negative for nausea.  Neurological: Negative for syncope.  All other systems reviewed and are negative.   Physical Exam Updated Vital Signs BP 128/79   Pulse 80   Temp 97.8 F (36.6 C) (Oral)   Resp 18   Ht 5\' 5"  (1.651 m)   Wt 126.6 kg   SpO2 100%   BMI 46.43 kg/m   Physical Exam Vitals and nursing note reviewed.  Constitutional:      Appearance: She is well-developed.  HENT:     Head: Normocephalic and atraumatic.  Eyes:     Pupils: Pupils are equal, round, and reactive to light.  Cardiovascular:     Rate and Rhythm: Normal rate and regular rhythm.      Heart sounds: Normal heart sounds.  Pulmonary:     Effort: Pulmonary effort is normal. No respiratory distress.     Breath sounds: No decreased breath sounds or wheezing.  Abdominal:     General: There is no distension.     Palpations: Abdomen is soft.     Tenderness: There is no abdominal tenderness.  Musculoskeletal:     Cervical back: Neck supple.  Right lower leg: No tenderness. No edema.     Left lower leg: No tenderness. No edema.  Skin:    General: Skin is warm and dry.  Neurological:     Mental Status: She is alert and oriented to person, place, and time.     ED Results / Procedures / Treatments   Labs (all labs ordered are listed, but only abnormal results are displayed) Labs Reviewed  BASIC METABOLIC PANEL - Abnormal; Notable for the following components:      Result Value   BUN 22 (*)    Creatinine, Ser 1.02 (*)    All other components within normal limits  CBC WITH DIFFERENTIAL/PLATELET - Abnormal; Notable for the following components:   WBC 11.6 (*)    All other components within normal limits  PREGNANCY, URINE  BRAIN NATRIURETIC PEPTIDE  CBG MONITORING, ED  TROPONIN I (HIGH SENSITIVITY)  TROPONIN I (HIGH SENSITIVITY)    EKG EKG Interpretation  Date/Time:  Saturday Jul 13 2020 08:01:15 EDT Ventricular Rate:  89 PR Interval:  153 QRS Duration: 93 QT Interval:  364 QTC Calculation: 443 R Axis:   36 Text Interpretation: Sinus rhythm Consider left atrial enlargement Low voltage, precordial leads No acute changes No old tracing to compare Confirmed by Varney Biles [phone]) on 07/13/2020 8:22:13 AM   Radiology CT ANGIO CHEST PE W OR WO CONTRAST  Result Date: 07/13/2020 CLINICAL DATA:  50 year old female shortness of breath. EXAM: CT ANGIOGRAPHY CHEST WITH CONTRAST TECHNIQUE: Multidetector CT imaging of the chest was performed using the standard protocol during bolus administration of intravenous contrast. Multiplanar CT image reconstructions and  MIPs were obtained to evaluate the vascular anatomy. CONTRAST:  158mL OMNIPAQUE IOHEXOL 350 MG/ML SOLN COMPARISON:  Portable chest x-ray today reported separately. CT Abdomen and Pelvis 02/05/2020. FINDINGS: Cardiovascular: Adequate contrast bolus timing in the pulmonary arterial tree. No focal filling defect identified in the pulmonary arteries to suggest acute pulmonary embolism. No cardiomegaly or pericardial effusion.  Negative visible aorta. No definite calcified coronary artery atherosclerosis. Mediastinum/Nodes: Negative.  No mediastinal lymphadenopathy. Lungs/Pleura: Major airways are patent. Lungs are negative aside from minimal atelectasis or scarring in the left lateral costophrenic angle. Upper Abdomen: Negative visible noncontrast liver, gallbladder, spleen, pancreas, adrenal glands and bowel in the upper abdomen aside from diverticulosis at the splenic flexure. Stable visible renal upper poles. Musculoskeletal: Thoracic disc and endplate degeneration. No acute osseous abnormality identified. Review of the MIP images confirms the above findings. IMPRESSION: 1. Negative for acute pulmonary embolus. Negative CT appearance of the Chest. 2. Mild diverticulosis at the splenic flexure. Electronically Signed   By: Genevie Ann M.D.   On: 07/13/2020 09:42   DG Chest Portable 1 View  Result Date: 07/13/2020 CLINICAL DATA:  50 year old female with back pain and shortness of breath. EXAM: PORTABLE CHEST 1 VIEW COMPARISON:  Chest CTA today reported separately. FINDINGS: Portable AP upright view at 0902 hours. Somewhat low lung volumes. Allowing for portable technique the lungs are clear. No pneumothorax or pleural effusion. Normal cardiac size and mediastinal contours. Visualized tracheal air column is within normal limits. No acute osseous abnormality identified. IMPRESSION: Negative portable chest. Electronically Signed   By: Genevie Ann M.D.   On: 07/13/2020 09:42    Procedures Procedures   Medications Ordered  in ED Medications  aspirin chewable tablet 324 mg (324 mg Oral Given 07/13/20 0829)  albuterol (VENTOLIN HFA) 108 (90 Base) MCG/ACT inhaler 2 puff (2 puffs Inhalation Given 07/13/20 0830)  iohexol (OMNIPAQUE) 350  MG/ML injection 100 mL (100 mLs Intravenous Contrast Given 07/13/20 0912)    ED Course  I have reviewed the triage vital signs and the nursing notes.  Pertinent labs & imaging results that were available during my care of the patient were reviewed by me and considered in my medical decision making (see chart for details).  Clinical Course as of 07/13/20 1040  Sat Jul 13, 2020  1038 CT ANGIO CHEST PE W OR WO CONTRAST BNP is less than 50.  Initial HST and is less than 2.  CT PE is negative for acute changes, no PE noted.  Results of the ER work-up discussed with the patient.  We have agreed to anti-inflammatory medication for the next 3 days and PCP follow-up for what appears to be pleurisy.  ACS suspicion is still low.  PCP will decide if patient needs to get cardiac evaluation.  We will ensure the second troponin is also negative. [AN]    Clinical Course User Index [AN] Varney Biles, MD   MDM Rules/Calculators/A&P                          50 year old comes in with chief complaint of shortness of breath. Patient is reporting exertional shortness of breath, back pain that is worse with deep inspiration.  Symptoms present for at least the last 2 or 3 days, more pronounced last night while Ms. Jewell was at work.  No near syncope or syncope associated with it.  No precordial chest pain.  Her medical history is overall quite reassuring.  I do have high suspicion for PE -patient had recent PE and long distance travels. Not appropriate for dimer given the high pre-test probability.  Labs ordered, CXR ordered. CT-PE ordered.  Final Clinical Impression(s) / ED Diagnoses Final diagnoses:  Exertional dyspnea  Pleurisy    Rx / DC Orders ED Discharge Orders    None        Varney Biles, MD 07/13/20 [id number]    Varney Biles, MD 07/13/20 1040

## 2020-08-15 ENCOUNTER — Other Ambulatory Visit (HOSPITAL_COMMUNITY): Payer: Self-pay

## 2020-08-15 ENCOUNTER — Other Ambulatory Visit: Payer: Self-pay | Admitting: Family Medicine

## 2020-08-15 MED ORDER — LEVOTHYROXINE SODIUM 25 MCG PO TABS
25.0000 ug | ORAL_TABLET | Freq: Every day | ORAL | 1 refills | Status: DC
  Filled 2020-08-15: qty 90, 90d supply, fill #0
  Filled 2021-01-13 – 2021-01-22 (×2): qty 90, 90d supply, fill #1

## 2020-08-15 MED FILL — Losartan Potassium & Hydrochlorothiazide Tab 50-12.5 MG: ORAL | 30 days supply | Qty: 30 | Fill #1 | Status: AC

## 2020-08-16 ENCOUNTER — Other Ambulatory Visit (HOSPITAL_COMMUNITY): Payer: Self-pay

## 2020-11-25 ENCOUNTER — Other Ambulatory Visit (HOSPITAL_COMMUNITY): Payer: Self-pay

## 2020-11-25 MED FILL — Losartan Potassium & Hydrochlorothiazide Tab 50-12.5 MG: ORAL | 30 days supply | Qty: 30 | Fill #2 | Status: AC

## 2021-01-10 ENCOUNTER — Other Ambulatory Visit: Payer: Self-pay | Admitting: Family Medicine

## 2021-01-10 DIAGNOSIS — Z1231 Encounter for screening mammogram for malignant neoplasm of breast: Secondary | ICD-10-CM

## 2021-01-13 ENCOUNTER — Other Ambulatory Visit (HOSPITAL_COMMUNITY): Payer: Self-pay

## 2021-01-13 MED FILL — Losartan Potassium & Hydrochlorothiazide Tab 50-12.5 MG: ORAL | 30 days supply | Qty: 30 | Fill #3 | Status: CN

## 2021-01-21 ENCOUNTER — Other Ambulatory Visit (HOSPITAL_COMMUNITY): Payer: Self-pay

## 2021-01-22 ENCOUNTER — Other Ambulatory Visit (HOSPITAL_COMMUNITY): Payer: Self-pay

## 2021-01-22 MED FILL — Losartan Potassium & Hydrochlorothiazide Tab 50-12.5 MG: ORAL | 30 days supply | Qty: 30 | Fill #3 | Status: AC

## 2021-02-04 ENCOUNTER — Encounter: Payer: 59 | Admitting: Family Medicine

## 2021-02-04 ENCOUNTER — Telehealth: Payer: Self-pay | Admitting: *Deleted

## 2021-02-04 NOTE — Telephone Encounter (Signed)
PLEASE NOTE: All timestamps contained within this report are represented as [place] Standard Time. CONFIDENTIALTY NOTICE: This fax transmission is intended only for the addressee. It contains information that is legally privileged, confidential or otherwise protected from use or disclosure. If you are not the intended recipient, you are strictly prohibited from reviewing, disclosing, copying using or disseminating any of this information or taking any action in reliance on or regarding this information. If you have received this fax in error, please notify us immediately by telephone so that we can arrange for its return to [place]. Phone: [phone], Toll-Free: [phone], Fax: [phone] Page: 1 of 1 Call Id: [id number] [place] Night - Client Nonclinical Telephone Record  [facility] Client [place] Night - Client Client Site [place] Provider Eliezer Lofts - MD Contact Type Call Who Is Calling Patient / Member / Family / Caregiver Caller Name [place] Phone Number [phone] Patient Name Jamie Mckenzie Patient DOB [birth year] Call Type Message Only Information Provided Reason for Call Request to [facility] Appointment Initial Comment Caller states she has appt. at 9 am she needs to reschedule Patient request to speak to RN No Additional Comment hrs prov Disp. Time Disposition Final User 02/04/2021 7:58:13 AM General Information Provided Yes Idolina Primer Call Closed By: Idolina Primer Transaction Date/Time: 02/04/2021 7:56:37 AM (ET)

## 2021-02-04 NOTE — Telephone Encounter (Signed)
Appointment already cancelled.

## 2021-02-11 ENCOUNTER — Ambulatory Visit
Admission: RE | Admit: 2021-02-11 | Discharge: 2021-02-11 | Disposition: A | Discharge status: Home / Self Care | Payer: 59 | Source: Ambulatory Visit

## 2021-02-11 DIAGNOSIS — Z1231 Encounter for screening mammogram for malignant neoplasm of breast: Secondary | ICD-10-CM

## 2021-02-13 ENCOUNTER — Other Ambulatory Visit: Payer: Self-pay | Admitting: Family Medicine

## 2021-02-13 DIAGNOSIS — R928 Other abnormal and inconclusive findings on diagnostic imaging of breast: Secondary | ICD-10-CM

## 2021-02-23 DIAGNOSIS — K810 Acute cholecystitis: Secondary | ICD-10-CM

## 2021-02-23 HISTORY — DX: Acute cholecystitis: K81.0

## 2021-02-26 ENCOUNTER — Other Ambulatory Visit (HOSPITAL_BASED_OUTPATIENT_CLINIC_OR_DEPARTMENT_OTHER): Payer: Self-pay | Admitting: General Surgery

## 2021-02-26 ENCOUNTER — Other Ambulatory Visit: Payer: Self-pay | Admitting: General Surgery

## 2021-02-26 ENCOUNTER — Other Ambulatory Visit (HOSPITAL_COMMUNITY): Payer: Self-pay | Admitting: General Surgery

## 2021-02-26 DIAGNOSIS — R1011 Right upper quadrant pain: Secondary | ICD-10-CM

## 2021-02-28 ENCOUNTER — Other Ambulatory Visit: Payer: Self-pay

## 2021-02-28 ENCOUNTER — Ambulatory Visit
Admission: RE | Admit: 2021-02-28 | Discharge: 2021-02-28 | Disposition: A | Discharge status: Home / Self Care | Payer: 59 | Source: Ambulatory Visit | Attending: General Surgery | Admitting: General Surgery

## 2021-02-28 DIAGNOSIS — R1011 Right upper quadrant pain: Secondary | ICD-10-CM | POA: Insufficient documentation

## 2021-02-28 DIAGNOSIS — K8020 Calculus of gallbladder without cholecystitis without obstruction: Secondary | ICD-10-CM | POA: Diagnosis not present

## 2021-03-12 ENCOUNTER — Ambulatory Visit
Admission: RE | Admit: 2021-03-12 | Discharge: 2021-03-12 | Disposition: A | Discharge status: Home / Self Care | Payer: 59 | Source: Ambulatory Visit | Attending: Family Medicine | Admitting: Family Medicine

## 2021-03-12 DIAGNOSIS — R928 Other abnormal and inconclusive findings on diagnostic imaging of breast: Secondary | ICD-10-CM

## 2021-03-12 DIAGNOSIS — N6012 Diffuse cystic mastopathy of left breast: Secondary | ICD-10-CM | POA: Diagnosis not present

## 2021-03-17 ENCOUNTER — Encounter (HOSPITAL_COMMUNITY): Admission: EM | Disposition: A | Discharge status: Home / Self Care | Payer: Self-pay | Source: Home / Self Care

## 2021-03-17 ENCOUNTER — Other Ambulatory Visit: Payer: Self-pay

## 2021-03-17 ENCOUNTER — Observation Stay (HOSPITAL_COMMUNITY): Payer: 59 | Admitting: General Practice

## 2021-03-17 ENCOUNTER — Observation Stay (HOSPITAL_BASED_OUTPATIENT_CLINIC_OR_DEPARTMENT_OTHER)
Admission: EM | Admit: 2021-03-17 | Discharge: 2021-03-18 | Disposition: A | Discharge status: Home / Self Care | Payer: 59 | Attending: General Surgery | Admitting: General Surgery

## 2021-03-17 ENCOUNTER — Encounter (HOSPITAL_BASED_OUTPATIENT_CLINIC_OR_DEPARTMENT_OTHER): Payer: Self-pay | Admitting: *Deleted

## 2021-03-17 ENCOUNTER — Emergency Department (HOSPITAL_BASED_OUTPATIENT_CLINIC_OR_DEPARTMENT_OTHER): Payer: 59

## 2021-03-17 DIAGNOSIS — N281 Cyst of kidney, acquired: Secondary | ICD-10-CM | POA: Diagnosis not present

## 2021-03-17 DIAGNOSIS — F17210 Nicotine dependence, cigarettes, uncomplicated: Secondary | ICD-10-CM | POA: Insufficient documentation

## 2021-03-17 DIAGNOSIS — R1011 Right upper quadrant pain: Secondary | ICD-10-CM | POA: Diagnosis not present

## 2021-03-17 DIAGNOSIS — K810 Acute cholecystitis: Secondary | ICD-10-CM | POA: Diagnosis present

## 2021-03-17 DIAGNOSIS — M1990 Unspecified osteoarthritis, unspecified site: Secondary | ICD-10-CM | POA: Diagnosis not present

## 2021-03-17 DIAGNOSIS — I10 Essential (primary) hypertension: Secondary | ICD-10-CM | POA: Diagnosis not present

## 2021-03-17 DIAGNOSIS — Z79899 Other long term (current) drug therapy: Secondary | ICD-10-CM | POA: Insufficient documentation

## 2021-03-17 DIAGNOSIS — R1010 Upper abdominal pain, unspecified: Secondary | ICD-10-CM | POA: Diagnosis not present

## 2021-03-17 DIAGNOSIS — G8918 Other acute postprocedural pain: Secondary | ICD-10-CM | POA: Diagnosis not present

## 2021-03-17 DIAGNOSIS — K8012 Calculus of gallbladder with acute and chronic cholecystitis without obstruction: Principal | ICD-10-CM | POA: Insufficient documentation

## 2021-03-17 DIAGNOSIS — E039 Hypothyroidism, unspecified: Secondary | ICD-10-CM | POA: Diagnosis not present

## 2021-03-17 DIAGNOSIS — K760 Fatty (change of) liver, not elsewhere classified: Secondary | ICD-10-CM | POA: Diagnosis not present

## 2021-03-17 DIAGNOSIS — K819 Cholecystitis, unspecified: Secondary | ICD-10-CM | POA: Diagnosis present

## 2021-03-17 DIAGNOSIS — K8000 Calculus of gallbladder with acute cholecystitis without obstruction: Secondary | ICD-10-CM | POA: Diagnosis not present

## 2021-03-17 DIAGNOSIS — Z20822 Contact with and (suspected) exposure to covid-19: Secondary | ICD-10-CM | POA: Diagnosis not present

## 2021-03-17 DIAGNOSIS — K8020 Calculus of gallbladder without cholecystitis without obstruction: Secondary | ICD-10-CM | POA: Diagnosis not present

## 2021-03-17 DIAGNOSIS — K8066 Calculus of gallbladder and bile duct with acute and chronic cholecystitis without obstruction: Secondary | ICD-10-CM | POA: Diagnosis not present

## 2021-03-17 HISTORY — DX: Essential (primary) hypertension: I10

## 2021-03-17 HISTORY — PX: CHOLECYSTECTOMY: SHX55

## 2021-03-17 LAB — CBC WITH DIFFERENTIAL/PLATELET
Abs Immature Granulocytes: 0.04 10*3/uL (ref 0.00–0.07)
Basophils Absolute: 0.1 10*3/uL (ref 0.0–0.1)
Basophils Relative: 1 %
Eosinophils Absolute: 0.1 10*3/uL (ref 0.0–0.5)
Eosinophils Relative: 1 %
HCT: 44.1 % (ref 36.0–46.0)
Hemoglobin: 14.2 g/dL (ref 12.0–15.0)
Immature Granulocytes: 0 %
Lymphocytes Relative: 23 %
Lymphs Abs: 2.4 10*3/uL (ref 0.7–4.0)
MCH: 29.2 pg (ref 26.0–34.0)
MCHC: 32.2 g/dL (ref 30.0–36.0)
MCV: 90.6 fL (ref 80.0–100.0)
Monocytes Absolute: 0.7 10*3/uL (ref 0.1–1.0)
Monocytes Relative: 6 %
Neutro Abs: 7.4 10*3/uL (ref 1.7–7.7)
Neutrophils Relative %: 69 %
Platelets: 297 10*3/uL (ref 150–400)
RBC: 4.87 MIL/uL (ref 3.87–5.11)
RDW: 13.3 % (ref 11.5–15.5)
WBC: 10.6 10*3/uL — ABNORMAL HIGH (ref 4.0–10.5)
nRBC: 0 % (ref 0.0–0.2)

## 2021-03-17 LAB — COMPREHENSIVE METABOLIC PANEL
ALT: 230 U/L — ABNORMAL HIGH (ref 0–44)
AST: 177 U/L — ABNORMAL HIGH (ref 15–41)
Albumin: 4.1 g/dL (ref 3.5–5.0)
Alkaline Phosphatase: 154 U/L — ABNORMAL HIGH (ref 38–126)
Anion gap: 10 (ref 5–15)
BUN: 17 mg/dL (ref 6–20)
CO2: 26 mmol/L (ref 22–32)
Calcium: 9.5 mg/dL (ref 8.9–10.3)
Chloride: 104 mmol/L (ref 98–111)
Creatinine, Ser: 0.74 mg/dL (ref 0.44–1.00)
GFR, Estimated: >60 mL/min (ref >60)
Glucose, Bld: 96 mg/dL (ref 70–99)
Potassium: 3.9 mmol/L (ref 3.5–5.1)
Sodium: 140 mmol/L (ref 135–145)
Total Bilirubin: 0.8 mg/dL (ref 0.3–1.2)
Total Protein: 7.2 g/dL (ref 6.5–8.1)

## 2021-03-17 LAB — SURGICAL PCR SCREEN
MRSA, PCR: POSITIVE — AB
Staphylococcus aureus: POSITIVE — AB

## 2021-03-17 LAB — CBC
HCT: 42.3 % (ref 36.0–46.0)
Hemoglobin: 13.4 g/dL (ref 12.0–15.0)
MCH: 29.3 pg (ref 26.0–34.0)
MCHC: 31.7 g/dL (ref 30.0–36.0)
MCV: 92.6 fL (ref 80.0–100.0)
Platelets: 294 10*3/uL (ref 150–400)
RBC: 4.57 MIL/uL (ref 3.87–5.11)
RDW: 13.2 % (ref 11.5–15.5)
WBC: 10 10*3/uL (ref 4.0–10.5)
nRBC: 0 % (ref 0.0–0.2)

## 2021-03-17 LAB — CREATININE, SERUM
Creatinine, Ser: 0.84 mg/dL (ref 0.44–1.00)
GFR, Estimated: >60 mL/min (ref >60)

## 2021-03-17 LAB — RESP PANEL BY RT-PCR (FLU A&B, COVID) ARPGX2
Influenza A by PCR: NEGATIVE
Influenza B by PCR: NEGATIVE
SARS Coronavirus 2 by RT PCR: NEGATIVE

## 2021-03-17 LAB — LIPASE, BLOOD: Lipase: 20 U/L (ref 11–51)

## 2021-03-17 LAB — HIV ANTIBODY (ROUTINE TESTING W REFLEX): HIV Screen 4th Generation wRfx: NONREACTIVE

## 2021-03-17 LAB — POCT PREGNANCY, URINE: Preg Test, Ur: NEGATIVE

## 2021-03-17 SURGERY — LAPAROSCOPIC CHOLECYSTECTOMY
Anesthesia: General | Site: Abdomen

## 2021-03-17 MED ORDER — ACETAMINOPHEN 500 MG PO TABS
1000.0000 mg | ORAL_TABLET | ORAL | Status: DC
  Filled 2021-03-17: qty 2

## 2021-03-17 MED ORDER — LIDOCAINE 2% (20 MG/ML) 5 ML SYRINGE
INTRAMUSCULAR | Status: AC
  Filled 2021-03-17: qty 5

## 2021-03-17 MED ORDER — ROCURONIUM BROMIDE 10 MG/ML (PF) SYRINGE
PREFILLED_SYRINGE | INTRAVENOUS | Status: DC | PRN
  Administered 2021-03-17: 10 mg via INTRAVENOUS
  Administered 2021-03-17: 40 mg via INTRAVENOUS
  Administered 2021-03-17: 10 mg via INTRAVENOUS

## 2021-03-17 MED ORDER — DEXAMETHASONE SODIUM PHOSPHATE 10 MG/ML IJ SOLN
INTRAMUSCULAR | Status: DC | PRN
  Administered 2021-03-17: 10 mg via INTRAVENOUS

## 2021-03-17 MED ORDER — ONDANSETRON HCL 4 MG/2ML IJ SOLN
INTRAMUSCULAR | Status: AC
  Filled 2021-03-17: qty 2

## 2021-03-17 MED ORDER — SODIUM CHLORIDE 0.9 % IV BOLUS
1000.0000 mL | Freq: Once | INTRAVENOUS | Status: AC
  Administered 2021-03-17: 1000 mL via INTRAVENOUS

## 2021-03-17 MED ORDER — HYDROMORPHONE HCL 1 MG/ML IJ SOLN: 0.5000 mg | INTRAMUSCULAR | Status: DC | PRN

## 2021-03-17 MED ORDER — PHENYLEPHRINE 40 MCG/ML (10ML) SYRINGE FOR IV PUSH (FOR BLOOD PRESSURE SUPPORT)
PREFILLED_SYRINGE | INTRAVENOUS | Status: DC | PRN
Start: 2021-03-17 — End: 2021-03-17
  Administered 2021-03-17 (×2): 80 ug via INTRAVENOUS

## 2021-03-17 MED ORDER — CHLORHEXIDINE GLUCONATE 0.12 % MT SOLN
OROMUCOSAL | Status: AC
  Administered 2021-03-17: 15 mL via OROMUCOSAL
  Filled 2021-03-17: qty 15

## 2021-03-17 MED ORDER — PROPOFOL 10 MG/ML IV BOLUS
INTRAVENOUS | Status: AC
  Filled 2021-03-17: qty 20

## 2021-03-17 MED ORDER — OXYCODONE HCL 5 MG PO TABS
5.0000 mg | ORAL_TABLET | ORAL | Status: DC | PRN
  Administered 2021-03-17 – 2021-03-18 (×2): 10 mg via ORAL
  Filled 2021-03-17 (×2): qty 2

## 2021-03-17 MED ORDER — MORPHINE SULFATE (PF) 2 MG/ML IV SOLN: 2.0000 mg | INTRAVENOUS | Status: DC | PRN

## 2021-03-17 MED ORDER — FENTANYL CITRATE (PF) 250 MCG/5ML IJ SOLN
INTRAMUSCULAR | Status: AC
  Filled 2021-03-17: qty 5

## 2021-03-17 MED ORDER — MIDAZOLAM HCL 2 MG/2ML IJ SOLN
INTRAMUSCULAR | Status: AC
  Filled 2021-03-17: qty 2

## 2021-03-17 MED ORDER — MORPHINE SULFATE (PF) 4 MG/ML IV SOLN
4.0000 mg | Freq: Once | INTRAVENOUS | Status: AC
  Administered 2021-03-17: 4 mg via INTRAVENOUS
  Filled 2021-03-17: qty 1

## 2021-03-17 MED ORDER — ONDANSETRON HCL 4 MG/2ML IJ SOLN: 4.0000 mg | Freq: Four times a day (QID) | INTRAMUSCULAR | Status: DC | PRN

## 2021-03-17 MED ORDER — INDOCYANINE GREEN 25 MG IV SOLR
INTRAVENOUS | Status: DC | PRN
  Administered 2021-03-17: 2.5 mg via INTRAVENOUS

## 2021-03-17 MED ORDER — OXYCODONE HCL 5 MG PO TABS
5.0000 mg | ORAL_TABLET | ORAL | Status: DC | PRN
Start: 2021-03-17 — End: 2021-03-17

## 2021-03-17 MED ORDER — LACTATED RINGERS IV SOLN: INTRAVENOUS | Status: DC

## 2021-03-17 MED ORDER — SODIUM CHLORIDE 0.9 % IR SOLN
Status: DC | PRN
  Administered 2021-03-17: 1000 mL

## 2021-03-17 MED ORDER — ONDANSETRON HCL 4 MG/2ML IJ SOLN
4.0000 mg | Freq: Once | INTRAMUSCULAR | Status: AC
  Administered 2021-03-17: 4 mg via INTRAVENOUS
  Filled 2021-03-17: qty 2

## 2021-03-17 MED ORDER — SODIUM CHLORIDE 0.9 % IV SOLN
1.0000 g | Freq: Once | INTRAVENOUS | Status: AC
  Administered 2021-03-17: 1 g via INTRAVENOUS
  Filled 2021-03-17: qty 10

## 2021-03-17 MED ORDER — METHOCARBAMOL 1000 MG/10ML IJ SOLN
500.0000 mg | Freq: Four times a day (QID) | INTRAVENOUS | Status: DC | PRN
  Filled 2021-03-17: qty 5

## 2021-03-17 MED ORDER — SUCCINYLCHOLINE CHLORIDE 200 MG/10ML IV SOSY
PREFILLED_SYRINGE | INTRAVENOUS | Status: DC | PRN
  Administered 2021-03-17: 200 mg via INTRAVENOUS

## 2021-03-17 MED ORDER — PANTOPRAZOLE SODIUM 40 MG PO TBEC
40.0000 mg | DELAYED_RELEASE_TABLET | Freq: Every day | ORAL | Status: DC
  Administered 2021-03-17 – 2021-03-18 (×2): 40 mg via ORAL
  Filled 2021-03-17 (×2): qty 1

## 2021-03-17 MED ORDER — ONDANSETRON HCL 4 MG/2ML IJ SOLN
INTRAMUSCULAR | Status: DC | PRN
  Administered 2021-03-17: 4 mg via INTRAVENOUS

## 2021-03-17 MED ORDER — PHENYLEPHRINE 40 MCG/ML (10ML) SYRINGE FOR IV PUSH (FOR BLOOD PRESSURE SUPPORT)
PREFILLED_SYRINGE | INTRAVENOUS | Status: AC
  Filled 2021-03-17: qty 10

## 2021-03-17 MED ORDER — DOCUSATE SODIUM 100 MG PO CAPS
100.0000 mg | ORAL_CAPSULE | Freq: Two times a day (BID) | ORAL | Status: DC
  Administered 2021-03-17 – 2021-03-18 (×3): 100 mg via ORAL
  Filled 2021-03-17 (×3): qty 1

## 2021-03-17 MED ORDER — BUPIVACAINE-EPINEPHRINE 0.25% -1:200000 IJ SOLN
INTRAMUSCULAR | Status: DC | PRN
  Administered 2021-03-17: 10 mL

## 2021-03-17 MED ORDER — LIDOCAINE 2% (20 MG/ML) 5 ML SYRINGE
INTRAMUSCULAR | Status: DC | PRN
Start: 2021-03-17 — End: 2021-03-17
  Administered 2021-03-17: 100 mg via INTRAVENOUS

## 2021-03-17 MED ORDER — ROCURONIUM BROMIDE 10 MG/ML (PF) SYRINGE
PREFILLED_SYRINGE | INTRAVENOUS | Status: AC
  Filled 2021-03-17: qty 10

## 2021-03-17 MED ORDER — SUGAMMADEX SODIUM 200 MG/2ML IV SOLN
INTRAVENOUS | Status: DC | PRN
  Administered 2021-03-17: 400 mg via INTRAVENOUS

## 2021-03-17 MED ORDER — MUPIROCIN 2 % EX OINT
1.0000 "application " | TOPICAL_OINTMENT | Freq: Two times a day (BID) | CUTANEOUS | Status: DC
  Administered 2021-03-17 – 2021-03-18 (×2): 1 via NASAL
  Filled 2021-03-17: qty 22

## 2021-03-17 MED ORDER — PROPOFOL 10 MG/ML IV BOLUS
INTRAVENOUS | Status: DC | PRN
Start: 2021-03-17 — End: 2021-03-17
  Administered 2021-03-17: 150 mg via INTRAVENOUS

## 2021-03-17 MED ORDER — 0.9 % SODIUM CHLORIDE (POUR BTL) OPTIME
TOPICAL | Status: DC | PRN
  Administered 2021-03-17: 1000 mL

## 2021-03-17 MED ORDER — HYDROCHLOROTHIAZIDE 12.5 MG PO TABS
12.5000 mg | ORAL_TABLET | Freq: Every day | ORAL | Status: DC
  Administered 2021-03-17 – 2021-03-18 (×2): 12.5 mg via ORAL
  Filled 2021-03-17 (×2): qty 1

## 2021-03-17 MED ORDER — CEFAZOLIN IN SODIUM CHLORIDE 3-0.9 GM/100ML-% IV SOLN
3.0000 g | Freq: Once | INTRAVENOUS | Status: AC
  Administered 2021-03-17: 3 g via INTRAVENOUS
  Filled 2021-03-17: qty 100

## 2021-03-17 MED ORDER — PROCHLORPERAZINE EDISYLATE 10 MG/2ML IJ SOLN: 10.0000 mg | INTRAMUSCULAR | Status: DC | PRN

## 2021-03-17 MED ORDER — MORPHINE SULFATE (PF) 4 MG/ML IV SOLN: 4.0000 mg | INTRAVENOUS | Status: DC | PRN

## 2021-03-17 MED ORDER — INDOCYANINE GREEN 25 MG IV SOLR
1.0000 mg | Freq: Once | INTRAVENOUS | Status: DC
  Filled 2021-03-17: qty 10

## 2021-03-17 MED ORDER — BUPIVACAINE-EPINEPHRINE (PF) 0.25% -1:200000 IJ SOLN
INTRAMUSCULAR | Status: DC | PRN
  Administered 2021-03-17 (×2): 30 mL

## 2021-03-17 MED ORDER — METHOCARBAMOL 500 MG PO TABS: 500.0000 mg | ORAL_TABLET | Freq: Four times a day (QID) | ORAL | Status: DC | PRN

## 2021-03-17 MED ORDER — ACETAMINOPHEN 325 MG PO TABS
650.0000 mg | ORAL_TABLET | Freq: Four times a day (QID) | ORAL | Status: DC
  Administered 2021-03-17 (×2): 650 mg via ORAL
  Filled 2021-03-17 (×2): qty 2

## 2021-03-17 MED ORDER — ACETAMINOPHEN 500 MG PO TABS
1000.0000 mg | ORAL_TABLET | Freq: Four times a day (QID) | ORAL | Status: DC
  Administered 2021-03-17 – 2021-03-18 (×3): 1000 mg via ORAL
  Filled 2021-03-17 (×3): qty 2

## 2021-03-17 MED ORDER — LOSARTAN POTASSIUM-HCTZ 50-12.5 MG PO TABS: 1.0000 | ORAL_TABLET | Freq: Every day | ORAL | Status: DC

## 2021-03-17 MED ORDER — CHLORHEXIDINE GLUCONATE CLOTH 2 % EX PADS
6.0000 | MEDICATED_PAD | Freq: Every day | CUTANEOUS | Status: DC
  Administered 2021-03-18: 05:00:00 6 via TOPICAL

## 2021-03-17 MED ORDER — OXYCODONE HCL 5 MG PO TABS: 10.0000 mg | ORAL_TABLET | ORAL | Status: DC | PRN

## 2021-03-17 MED ORDER — SIMETHICONE 80 MG PO CHEW: 80.0000 mg | CHEWABLE_TABLET | Freq: Four times a day (QID) | ORAL | Status: DC | PRN

## 2021-03-17 MED ORDER — POLYETHYLENE GLYCOL 3350 17 G PO PACK: 17.0000 g | PACK | Freq: Every day | ORAL | Status: DC | PRN

## 2021-03-17 MED ORDER — CHLORHEXIDINE GLUCONATE 0.12 % MT SOLN: 15.0000 mL | Freq: Once | OROMUCOSAL | Status: AC

## 2021-03-17 MED ORDER — ENOXAPARIN SODIUM 40 MG/0.4ML IJ SOSY
40.0000 mg | PREFILLED_SYRINGE | INTRAMUSCULAR | Status: DC
  Administered 2021-03-18: 10:00:00 40 mg via SUBCUTANEOUS
  Filled 2021-03-17: qty 0.4

## 2021-03-17 MED ORDER — DEXAMETHASONE SODIUM PHOSPHATE 10 MG/ML IJ SOLN
INTRAMUSCULAR | Status: AC
  Filled 2021-03-17: qty 1

## 2021-03-17 MED ORDER — LOSARTAN POTASSIUM 50 MG PO TABS
50.0000 mg | ORAL_TABLET | Freq: Every day | ORAL | Status: DC
  Administered 2021-03-17 – 2021-03-18 (×2): 50 mg via ORAL
  Filled 2021-03-17 (×2): qty 1

## 2021-03-17 MED ORDER — MIDAZOLAM HCL 2 MG/2ML IJ SOLN
INTRAMUSCULAR | Status: DC | PRN
  Administered 2021-03-17: 2 mg via INTRAVENOUS

## 2021-03-17 MED ORDER — HEMOSTATIC AGENTS (NO CHARGE) OPTIME
TOPICAL | Status: DC | PRN
  Administered 2021-03-17: 1 via TOPICAL

## 2021-03-17 MED ORDER — FENTANYL CITRATE (PF) 250 MCG/5ML IJ SOLN
INTRAMUSCULAR | Status: DC | PRN
Start: 2021-03-17 — End: 2021-03-17
  Administered 2021-03-17 (×3): 50 ug via INTRAVENOUS
  Administered 2021-03-17: 100 ug via INTRAVENOUS

## 2021-03-17 MED ORDER — LEVOTHYROXINE SODIUM 25 MCG PO TABS
25.0000 ug | ORAL_TABLET | Freq: Every day | ORAL | Status: DC
  Administered 2021-03-17 – 2021-03-18 (×2): 25 ug via ORAL
  Filled 2021-03-17 (×2): qty 1

## 2021-03-17 MED ORDER — ORAL CARE MOUTH RINSE: 15.0000 mL | Freq: Once | OROMUCOSAL | Status: AC

## 2021-03-17 SURGICAL SUPPLY — 48 items
APPLIER CLIP 5 13 M/L LIGAMAX5 (MISCELLANEOUS) ×3
BAG COUNTER SPONGE SURGICOUNT (BAG) ×2 IMPLANT
BAG SURGICOUNT SPONGE COUNTING (BAG) ×1
BIOPATCH RED 1 DISK 7.0 (GAUZE/BANDAGES/DRESSINGS) ×1 IMPLANT
BIOPATCH RED 1IN DISK 7.0MM (GAUZE/BANDAGES/DRESSINGS) ×1
BLADE CLIPPER SURG (BLADE) IMPLANT
CANISTER SUCT 3000ML PPV (MISCELLANEOUS) ×3 IMPLANT
CHLORAPREP W/TINT 26 (MISCELLANEOUS) ×3 IMPLANT
CLIP APPLIE 5 13 M/L LIGAMAX5 (MISCELLANEOUS) ×1 IMPLANT
CLOSURE WOUND 1/2 X4 (GAUZE/BANDAGES/DRESSINGS) ×1
COVER SURGICAL LIGHT HANDLE (MISCELLANEOUS) ×3 IMPLANT
DERMABOND ADVANCED (GAUZE/BANDAGES/DRESSINGS) ×2
DERMABOND ADVANCED .7 DNX12 (GAUZE/BANDAGES/DRESSINGS) ×1 IMPLANT
DRAIN CHANNEL 19F RND (DRAIN) ×2 IMPLANT
DRSG TEGADERM 4X4.5 CHG (GAUZE/BANDAGES/DRESSINGS) ×2 IMPLANT
ELECT REM PT RETURN 9FT ADLT (ELECTROSURGICAL) ×3
ELECTRODE REM PT RTRN 9FT ADLT (ELECTROSURGICAL) ×1 IMPLANT
ENDOLOOP SUT PDS II  0 18 (SUTURE) ×4
ENDOLOOP SUT PDS II 0 18 (SUTURE) IMPLANT
EVACUATOR SILICONE 100CC (DRAIN) ×2 IMPLANT
GLOVE SURG ENC MOIS LTX SZ7 (GLOVE) ×3 IMPLANT
GLOVE SURG UNDER POLY LF SZ7.5 (GLOVE) ×3 IMPLANT
GOWN STRL REUS W/ TWL LRG LVL3 (GOWN DISPOSABLE) ×3 IMPLANT
GOWN STRL REUS W/TWL LRG LVL3 (GOWN DISPOSABLE) ×6
GRASPER SUT TROCAR 14GX15 (MISCELLANEOUS) ×3 IMPLANT
HEMOSTAT SNOW SURGICEL 2X4 (HEMOSTASIS) ×4 IMPLANT
IV NS IRRIG 3000ML ARTHROMATIC (IV SOLUTION) ×2 IMPLANT
KIT BASIN OR (CUSTOM PROCEDURE TRAY) ×3 IMPLANT
KIT TURNOVER KIT B (KITS) ×3 IMPLANT
NS IRRIG 1000ML POUR BTL (IV SOLUTION) ×3 IMPLANT
PAD ARMBOARD 7.5X6 YLW CONV (MISCELLANEOUS) ×3 IMPLANT
POUCH RETRIEVAL ECOSAC 10 (ENDOMECHANICALS) ×1 IMPLANT
POUCH RETRIEVAL ECOSAC 10MM (ENDOMECHANICALS) ×2
SCISSORS LAP 5X35 DISP (ENDOMECHANICALS) ×3 IMPLANT
SET IRRIG TUBING LAPAROSCOPIC (IRRIGATION / IRRIGATOR) ×3 IMPLANT
SET TUBE SMOKE EVAC HIGH FLOW (TUBING) ×3 IMPLANT
SLEEVE ENDOPATH XCEL 5M (ENDOMECHANICALS) ×6 IMPLANT
SPECIMEN JAR SMALL (MISCELLANEOUS) ×3 IMPLANT
STRIP CLOSURE SKIN 1/2X4 (GAUZE/BANDAGES/DRESSINGS) ×2 IMPLANT
SUT ETHILON 2 0 FS 18 (SUTURE) ×2 IMPLANT
SUT MNCRL AB 4-0 PS2 18 (SUTURE) ×3 IMPLANT
SUT VICRYL 0 UR6 27IN ABS (SUTURE) ×3 IMPLANT
TOWEL GREEN STERILE (TOWEL DISPOSABLE) ×3 IMPLANT
TOWEL GREEN STERILE FF (TOWEL DISPOSABLE) ×3 IMPLANT
TRAY LAPAROSCOPIC MC (CUSTOM PROCEDURE TRAY) ×3 IMPLANT
TROCAR XCEL BLUNT TIP 100MML (ENDOMECHANICALS) ×3 IMPLANT
TROCAR XCEL NON-BLD 5MMX100MML (ENDOMECHANICALS) ×3 IMPLANT
WATER STERILE IRR 1000ML POUR (IV SOLUTION) ×3 IMPLANT

## 2021-03-17 NOTE — Anesthesia Preprocedure Evaluation (Addendum)
Anesthesia Evaluation  Patient identified by MRN, date of birth, ID band Patient awake    Reviewed: Allergy & Precautions, NPO status , Patient's Chart, lab work & pertinent test results  Airway Mallampati: IV  TM Distance: >3 FB Neck ROM: Full    Dental  (+) Chipped, Dental Advisory Given, Missing,    Pulmonary Current Smoker and Patient abstained from smoking.,    Pulmonary exam normal breath sounds clear to auscultation       Cardiovascular hypertension, Pt. on medications Normal cardiovascular exam Rhythm:Regular Rate:Normal     Neuro/Psych  Headaches, PSYCHIATRIC DISORDERS Depression    GI/Hepatic GERD  Medicated and Controlled,Elevated LFT's Cholelithiasis with Acute Cholecystitis   Endo/Other  Hypothyroidism Morbid obesity (BMI 31)  Renal/GU negative Renal ROS  negative genitourinary   Musculoskeletal  (+) Arthritis , Osteoarthritis,    Abdominal   Peds  Hematology negative hematology ROS (+)   Anesthesia Other Findings   Reproductive/Obstetrics                          Anesthesia Physical Anesthesia Plan  ASA: 3  Anesthesia Plan: General   Post-op Pain Management:    Induction: Intravenous  PONV Risk Score and Plan: 4 or greater and Ondansetron, Midazolam, Dexamethasone and Treatment may vary due to age or medical condition  Airway Management Planned: Oral ETT  Additional Equipment: None  Intra-op Plan:   Post-operative Plan: Extubation in OR  Informed Consent: I have reviewed the patients History and Physical, chart, labs and discussed the procedure including the risks, benefits and alternatives for the proposed anesthesia with the patient or authorized representative who has indicated his/her understanding and acceptance.     Dental advisory given  Plan Discussed with:   Anesthesia Plan Comments:       Anesthesia Quick Evaluation

## 2021-03-17 NOTE — ED Provider Notes (Signed)
[facility] Provider Note   CSN: [id number] Arrival date & time: 03/17/21  0054     History  Chief Complaint  Patient presents with   Abdominal Pain    Jamie Mckenzie is a 51 y.o. female.  HPI     This is a 51 year old female with a history of reflux, gallstones who presents with right upper quadrant pain.  Patient reports that she woke up around 11:30 PM with acute onset of reflux symptoms and right upper quadrant pain.  She states the pain radiated to her right shoulder.  Pain is 8 out of 10.  She has had similar previous symptoms in the past.  Recently had a right upper quadrant ultrasound that confirmed gallstones.  She is due to see general surgery in 2 weeks.  Patient states that she has had at least 2 episodes today.  She states that normally she only has occasional episodes every few weeks.  She has not had any fevers.  No known sick contacts.  Reports nausea without vomiting.  Home Medications Prior to Admission medications   Medication Sig Start Date End Date Taking? Authorizing Provider  levothyroxine (SYNTHROID) 25 MCG tablet Take 1 tablet (25 mcg total) by mouth daily. 08/15/20   Bedsole, Amy E, MD  losartan-hydrochlorothiazide (HYZAAR) 50-12.5 MG tablet TAKE 1 TABLET BY MOUTH DAILY. 04/30/20 04/30/21  Jinny Sanders, MD  omeprazole (PRILOSEC) 20 MG capsule Take 1 capsule (20 mg total) by mouth daily. 05/21/16   Bedsole, Amy E, MD  triamcinolone cream (KENALOG) 0.1 % Apply topically 2 (two) times daily. 12/13/19   [provider]      Allergies    Patient has no allergy information on record.    Review of Systems   Review of Systems  Constitutional:  Negative for fever.  Gastrointestinal:  Positive for abdominal pain and nausea. Negative for vomiting.  All other systems reviewed and are negative.  Physical Exam Updated Vital Signs BP (!) 141/90 (BP Location: Right Arm)    Pulse 76    Temp 98.4 F (36.9 C) (Oral)    Resp 18     SpO2 99%  Physical Exam Vitals and nursing note reviewed.  Constitutional:      Appearance: She is well-developed. She is obese. She is not ill-appearing.  HENT:     Head: Normocephalic and atraumatic.  Eyes:     Pupils: Pupils are equal, round, and reactive to light.  Cardiovascular:     Rate and Rhythm: Normal rate and regular rhythm.     Heart sounds: Normal heart sounds.  Pulmonary:     Effort: Pulmonary effort is normal. No respiratory distress.     Breath sounds: No wheezing.  Abdominal:     General: Bowel sounds are normal.     Palpations: Abdomen is soft.     Tenderness: There is abdominal tenderness in the right upper quadrant and epigastric area. There is no guarding or rebound. Positive signs include Murphy's sign.  Musculoskeletal:     Cervical back: Neck supple.  Skin:    General: Skin is warm and dry.  Neurological:     Mental Status: She is alert and oriented to person, place, and time.  Psychiatric:        Mood and Affect: Mood normal.    ED Results / Procedures / Treatments   Labs (all labs ordered are listed, but only abnormal results are displayed) Labs Reviewed  CBC WITH DIFFERENTIAL/PLATELET - Abnormal; Notable for the  following components:      Result Value   WBC 10.6 (*)    All other components within normal limits  COMPREHENSIVE METABOLIC PANEL - Abnormal; Notable for the following components:   AST 177 (*)    ALT 230 (*)    Alkaline Phosphatase 154 (*)    All other components within normal limits  RESP PANEL BY RT-PCR (FLU A&B, COVID) ARPGX2  LIPASE, BLOOD    EKG None  Radiology US Abdomen Limited RUQ (LIVER/GB)  Result Date: 03/17/2021 CLINICAL DATA:  Right upper quadrant abdominal pain. EXAM: ULTRASOUND ABDOMEN LIMITED RIGHT UPPER QUADRANT COMPARISON:  Ultrasound dated 02/28/2021. FINDINGS: Gallbladder: There is sludge and small stones within the gallbladder. Top-normal gallbladder wall thickness. No pericholecystic fluid. Positive  sonographic Murphy's sign reported. Common bile duct: Diameter: 8 mm Liver: There is diffuse increased liver echogenicity most commonly seen in the setting of fatty infiltration. Superimposed inflammation or fibrosis is not excluded. Clinical correlation is recommended. Portal vein is patent on color Doppler imaging with normal direction of blood flow towards the liver. Other: There is a 4 cm right renal upper pole cyst. IMPRESSION: 1. Cholelithiasis with equivocal findings for acute cholecystitis. A hepatobiliary scintigraphy may provide better evaluation of the gallbladder if there is a high clinical concern for acute cholecystitis . 2. Fatty liver. 3. Right renal cyst. Electronically Signed   By: Anner Crete M.D.   On: 03/17/2021 02:00    Procedures Procedures    Medications Ordered in ED Medications  cefTRIAXone (ROCEPHIN) 1 g in sodium chloride 0.9 % 100 mL IVPB (has no administration in time range)  morphine 4 MG/ML injection 4 mg (has no administration in time range)  sodium chloride 0.9 % bolus 1,000 mL (1,000 mLs Intravenous New Bag/Given 03/17/21 0124)  ondansetron (ZOFRAN) injection 4 mg (4 mg Intravenous Given 03/17/21 0117)  morphine 4 MG/ML injection 4 mg (4 mg Intravenous Given 03/17/21 0117)    ED Course/ Medical Decision Making/ A&P Clinical Course as of 03/17/21 0222  Mon Mar 17, 2021  0220 Spoke with General Surgery, Dr. Thermon Leyland.  Have reviewed labs and imaging with him.  Given ongoing biliary colic symptoms and objective LFT derangements, recommends that she would likely need her gallbladder out sooner rather than later.  Patient agreeable to being admitted to the general surgery service.  Given IV Rocephin.  COVID screening pending.  Admission orders placed. [CH]    Clinical Course User Index [CH] Tashya Alberty, Barbette Hair, MD                           Medical Decision Making Amount and/or Complexity of Data Reviewed Labs: ordered. Radiology:  ordered.  Risk Prescription drug management. Decision regarding hospitalization.   This patient presents to the ED for concern of right upper quadrant abdominal pain, this involves an extensive number of treatment options, and is a complaint that carries with it a high risk of complications and morbidity.  The differential diagnosis includes biliary colic, acute cholecystitis, reflux, pancreatitis, less likely appendicitis  MDM:    This is a 51 year old female with a history of reflux and gallstones who presents with right upper quadrant abdominal pain.  She is nontoxic and vital signs are notable for blood pressure of 141/90.  Patient is tender in the epigastrium and right upper quadrant.  Given history, would be highly suspicious for biliary colic versus cholecystitis.  She is not febrile or toxic appearing.  She was given  pain and nausea medication.  She was made NPO.  Repeat right upper quadrant ultrasound was obtained and is equivocal for cholecystitis.  She has gallstones and her gallbladder wall is the upper limits of normal.  No pericholecystic fluid.  She does have some slightly elevated LFTs but a normal bilirubin.  White blood cell count is 10.6.  On recheck, she states she feels much better.  I did discuss her with general surgery.  Given symptoms and objective LFT derangements, would recommend gallbladder removal.  Patient given IV Rocephin. (Labs, imaging)  Labs: I Ordered, and personally interpreted labs.  The pertinent results include: White count 10.6, elevated AST and ALT  Imaging Studies ordered: I ordered imaging studies including right upper quadrant ultrasound I independently visualized and interpreted imaging. I agree with the radiologist interpretation  Additional history obtained from chart review.  External records from outside source obtained and reviewed including prior ultrasound  Critical Interventions: IV antibiotics, pain medications  Consultations: I  requested consultation with the general surgery,  and discussed lab and imaging findings as well as pertinent plan - they recommend: Admission for cholecystectomy  Cardiac Monitoring: The patient was maintained on a cardiac monitor.  I personally viewed and interpreted the cardiac monitored which showed an underlying rhythm of: Normal sinus rhythm  Reevaluation: After the interventions noted above, I reevaluated the patient and found that they have :improved  Social Determinants of Health: Single nurse, lives alone  Disposition: Admit  Co morbidities that complicate the patient evaluation  Past Medical History:  Diagnosis Date   Abrasion of right wrist 04/15/2012   Arthritis    ankles   Cough 04/15/2012   Dental crowns present    GERD (gastroesophageal reflux disease)    Hypothyroidism    Perimenopausal 03/2012   irregular periods   Retained orthopedic hardware 03/2012   left ankle   Stuffy and runny nose    clear drainage from nose - URI onset 03/30/2012     Medicines Meds ordered this encounter  Medications   sodium chloride 0.9 % bolus 1,000 mL   ondansetron (ZOFRAN) injection 4 mg   morphine 4 MG/ML injection 4 mg   cefTRIAXone (ROCEPHIN) 1 g in sodium chloride 0.9 % 100 mL IVPB    Order Specific Question:   Antibiotic Indication:    Answer:   Intra-abdominal   morphine 4 MG/ML injection 4 mg    I have reviewed the patients home medicines and have made adjustments as needed  Problem List / ED Course: Problem List Items Addressed This Visit       Digestive   * (Principal) Acute cholecystitis - Primary   Other Visit Diagnoses     RUQ pain       Relevant Orders   US Abdomen Limited RUQ (LIVER/GB) (Completed)                   Final Clinical Impression(s) / ED Diagnoses Final diagnoses:  RUQ pain  Acute cholecystitis    Rx / DC Orders ED Discharge Orders     None         Merryl Hacker, MD 03/17/21 [phone]

## 2021-03-17 NOTE — Progress Notes (Signed)
° °  Subjective/Chief Complaint: Feels much better today, no more pain   Objective: Vital signs in last 24 hours: Temp:  [97.8 F (36.6 C)-98.4 F (36.9 C)] 97.8 F (36.6 C) (01/23 0808) Pulse Rate:  [68-91] 69 (01/23 0808) Resp:  [14-18] 16 (01/23 0808) BP: (124-181)/(74-108) 124/74 (01/23 0808) SpO2:  [95 %-100 %] 95 % (01/23 0808) Weight:  [732 kg] 138 kg (01/23 0422) Last BM Date: 03/16/21 (per pt report)  Intake/Output from previous day: 01/22 0701 - 01/23 0700 In: 40.9 [I.V.:40.9] Out: -  Intake/Output this shift: No intake/output data recorded.  GI: soft nontender nondistended  Lab Results:  Recent Labs    03/17/21 0120 03/17/21 0532  WBC 10.6* 10.0  HGB 14.2 13.4  HCT 44.1 42.3  PLT 297 294   BMET Recent Labs    03/17/21 0120 03/17/21 0532  NA 140  --   K 3.9  --   CL 104  --   CO2 26  --   GLUCOSE 96  --   BUN 17  --   CREATININE 0.74 0.84  CALCIUM 9.5  --    PT/INR No results for input(s): LABPROT, INR in the last 72 hours. ABG No results for input(s): PHART, HCO3 in the last 72 hours.  Invalid input(s): PCO2, PO2  Studies/Results: US Abdomen Limited RUQ (LIVER/GB)  Result Date: 03/17/2021 CLINICAL DATA:  Right upper quadrant abdominal pain. EXAM: ULTRASOUND ABDOMEN LIMITED RIGHT UPPER QUADRANT COMPARISON:  Ultrasound dated 02/28/2021. FINDINGS: Gallbladder: There is sludge and small stones within the gallbladder. Top-normal gallbladder wall thickness. No pericholecystic fluid. Positive sonographic Murphy's sign reported. Common bile duct: Diameter: 8 mm Liver: There is diffuse increased liver echogenicity most commonly seen in the setting of fatty infiltration. Superimposed inflammation or fibrosis is not excluded. Clinical correlation is recommended. Portal vein is patent on color Doppler imaging with normal direction of blood flow towards the liver. Other: There is a 4 cm right renal upper pole cyst. IMPRESSION: 1. Cholelithiasis with  equivocal findings for acute cholecystitis. A hepatobiliary scintigraphy may provide better evaluation of the gallbladder if there is a high clinical concern for acute cholecystitis . 2. Fatty liver. 3. Right renal cyst. Electronically Signed   By: Anner Crete M.D.   On: 03/17/2021 02:00    Anti-infectives: Anti-infectives (From admission, onward)    Start     Dose/Rate Route Frequency Ordered Stop   03/17/21 0230  cefTRIAXone (ROCEPHIN) 1 g in sodium chloride 0.9 % 100 mL IVPB        1 g 200 mL/hr over 30 Minutes Intravenous  Once 03/17/21 0220 03/17/21 2025       Assessment/Plan: Biliary colic -I discussed the procedure in detail.  We discussed the risks and benefits of a laparoscopic cholecystectomy and possible cholangiogram including, but not limited to bleeding, infection, injury to surrounding structures such as the intestine or liver, bile leak, retained gallstones, need to convert to an open procedure, prolonged diarrhea, blood clots such as  DVT, common bile duct injury, anesthesia risks, and possible need for additional procedures.  The likelihood of improvement in symptoms and return to the patient's normal status is good. We discussed the typical post-operative recovery course.  Rolm Bookbinder 03/17/2021

## 2021-03-17 NOTE — Anesthesia Procedure Notes (Signed)
Anesthesia Regional Block: TAP block   Pre-Anesthetic Checklist: , timeout performed,  Correct Patient, Correct Site, Correct Laterality,  Correct Procedure, Correct Position, site marked,  Risks and benefits discussed,  Surgical consent,  Pre-op evaluation,  At surgeon's request and post-op pain management  Laterality: Left and Right  Prep: chloraprep       Needles:  Injection technique: Single-shot  Needle Type: Echogenic Stimulator Needle     Needle Length: 5cm  Needle Gauge: 22     Additional Needles:   Procedures:, nerve stimulator,,, ultrasound used (permanent image in chart),,    Narrative:  Start time: 03/17/2021 4:15 PM End time: 03/17/2021 4:33 PM Injection made incrementally with aspirations every 5 mL.  Performed by: Personally   Additional Notes: Functioning IV was confirmed and monitors were applied.  A 74mm 22ga Arrow echogenic stimulator needle was used. Sterile prep and drape,hand hygiene and sterile gloves were used. Ultrasound guidance: relevant anatomy identified, needle position confirmed, local anesthetic spread visualized around nerve(s)., vascular puncture avoided.  Image printed for medical record. Negative aspiration and negative test dose prior to incremental administration of local anesthetic. The patient tolerated the procedure well.

## 2021-03-17 NOTE — Op Note (Signed)
Preoperative diagnosis: acute cholecystitis Postoperative diagnosis: Gangrenous cholecystitis Procedure: Laparoscopic cholecystectomy Surgeon: Dr. Serita Grammes Anesthesia: Bilateral tap blocks and general Estimated blood loss: Minimal Complications: None Drains: 19 Fr Blake drain Specimens: Gallbladder and contents to pathology Special count was correct x2 at end of operation Disposition recovery stable condition  Indications: 6 yof with longstanding biliary colic.  This is worst episode of pain.  Transaminases elevated.  She had [place] that shows stones and sludge with borderline wall thickening.  She had sonographic Murphys sign. I discussed lap chole with her.   Procedure: After informed consent was obtained she was taken to the OR.  She was given antibiotics.  SCDs were in place.  She was placed under general anesthesia without complication.  She was prepped and draped in the standard sterile surgical fashion.  Surgical timeout was then performed.  Infiltrated Marcaine below the umbilicus.  Made a vertical incision with 11 blade.  I grasped the fascia with a Kocher clamp.  I incised this sharply and entered the peritoneal cavity bluntly without injury.  I then placed a 0 Vicryl pursestring suture through the fascia.  I inserted a Hassan trocar and insufflated the abdomen to 15 mmHg pressure.  I then inserted 3 additional 5 mm trocars in epigastrium and right upper quadrant under direct vision without complication.  She had a fair amount of visceral adiposity making this difficult. The gallbladder was noted to have gangrenous cholecystitis.  I was able to retract this cephalad and lateral.  I then was able to dissect the triangle and obtain the critical view of safety.  I had injected her with ICG dye beforehand. I was able to obtain the critical view of safety.  The proximal gallbladder was necrotic and when I grasped it fell apart. There was spillage of stones and I was able to evacuate these.  I  was able to clip the cystic duct and divided it leaving 2 clips in place.  These clips completely traversed the duct but duct is questionable. I was unable to place an endoloop as this retracted when the gallbladder fell apart. I do feel the clips are closing the duct.  I did endoloop the gallbladder to prevent more spillage.    I then treated the artery in a similar fashion.  There was a small posterior branch that I clipped as well that was bleeding. The gallbladder was then removed from the liver bed and placed in a retrieval bag.  This was removed from the abdomen.  Hemostasis was observed. The liver was friable and I did place two pieces of surgicel snow.  I also placed a 62 [place] Blake drain in the ruq due to concern for leakage postoperatively. I secured this with a 2-0 nylon suture. I removed the [facility] trocar and tied the pursestring down.  I placed 2 additional 0 Vicryl sutures using the suture passer device to completely better obliterate the umbilical defect.  The trocars were then removed and the abdomen desufflated.  These were closed with 4-0 Monocryl and glue.  She tolerated this well was extubated and transferred to recovery stable

## 2021-03-17 NOTE — ED Triage Notes (Addendum)
Pt c/o right upper abd pain that started last night around 2330 and radiates into her back. Hx of gallbladder issues. Pt in obvious pain during triage. C/o nausea, denies vomiting. Denies fevers. Took pepcid prior to arrival.

## 2021-03-17 NOTE — Anesthesia Procedure Notes (Signed)
Procedure Name: Intubation Date/Time: 03/17/2021 3:28 PM Performed by: Dorann Lodge, CRNA Pre-anesthesia Checklist: Patient identified, Emergency Drugs available, Suction available and Patient being monitored Patient Re-evaluated:Patient Re-evaluated prior to induction Oxygen Delivery Method: Circle System Utilized Preoxygenation: Pre-oxygenation with 100% oxygen Induction Type: IV induction Ventilation: Mask ventilation without difficulty and Oral airway inserted - appropriate to patient size Laryngoscope Size: Glidescope and 3 Grade View: Grade I Tube type: Oral Tube size: 7.0 mm Number of attempts: 2 Airway Equipment and Method: Stylet and Oral airway Placement Confirmation: ETT inserted through vocal cords under direct vision, positive ETCO2 and breath sounds checked- equal and bilateral Secured at: 22 cm Tube secured with: Tape Dental Injury: Teeth and Oropharynx as per pre-operative assessment  Comments: DL x1 with Mac 3, grade III view. Elected to use Glidescope. Grade 1 view obtained with successful intubation.

## 2021-03-17 NOTE — Transfer of Care (Signed)
Immediate Anesthesia Transfer of Care Note  Patient: Jamie Mckenzie  Procedure(s) Performed: LAPAROSCOPIC CHOLECYSTECTOMY WITH ICG (Abdomen)  Patient Location: PACU  Anesthesia Type:General  Level of Consciousness: awake and drowsy  Airway & Oxygen Therapy: Patient Spontanous Breathing and Patient connected to face mask oxygen  Post-op Assessment: Report given to RN and Post -op Vital signs reviewed and stable  Post vital signs: Reviewed and stable  Last Vitals:  Vitals Value Taken Time  BP 142/68 03/17/21 1650  Temp    Pulse 88 03/17/21 1657  Resp 14 03/17/21 1657  SpO2 97 % 03/17/21 1657  Vitals shown include unvalidated device data.  Last Pain:  Vitals:   03/17/21 1451  TempSrc: Oral  PainSc:          Complications: No notable events documented.

## 2021-03-17 NOTE — H&P (Signed)
Admitting Physician: Nickola Major Thamar Holik  Service: General surgery  CC: Abdominal Mckenzie  Subjective   HPI: Jamie Mckenzie is an 51 y.o. female who is here for abdominal Mckenzie.  She has had progressively worsening right upper quadrant abdominal Mckenzie after eating.  It happens a few hours after eating.  She was undergoing an outpatient workup with Dr. Donne Hazel, but her Mckenzie has been more difficult to tolerate.  To the point where she was evaluated in the [facility] yesterday and then transferred to the surgery service for evaluation and surgery.  She does use tobacco and occasionally uses NSAIDs.  Past Medical History:  Diagnosis Date   Abrasion of right wrist 04/15/2012   Arthritis    ankles   Cough 04/15/2012   Dental crowns present    GERD (gastroesophageal reflux disease)    Hypothyroidism    Perimenopausal 03/2012   irregular periods   Retained orthopedic hardware 03/2012   left ankle   Stuffy and runny nose    clear drainage from nose - URI onset 03/30/2012    Past Surgical History:  Procedure Laterality Date   BREAST BIOPSY Left 10/23/2014   Benign   DILATION AND EVACUATION  11/15/2008   HARDWARE REMOVAL Left 04/21/2012   Procedure: HARDWARE REMOVAL;  Surgeon: Wylene Simmer, MD;  Location: [place] [facility];  Service: Orthopedics;  Laterality: Left;  LEFT ANKLE HARDWARE REMOVAL   HYSTEROSCOPY WITH D & C  10/24/2009   with polypectomy   MULTIPLE TOOTH EXTRACTIONS     ORIF ANKLE FRACTURE Right 1985   ORIF ANKLE FRACTURE  11/03/2011   Procedure: OPEN REDUCTION INTERNAL FIXATION (ORIF) ANKLE FRACTURE;  Surgeon: Wylene Simmer, MD;  Location: [place];  Service: Orthopedics;  Laterality: Left;  Open Reduction Internal Fixation left ankle lateral malleolus fracture, left syndesmosis    Family History  Problem Relation Age of Onset   Hypertension Mother    Arthritis Mother    Obesity Mother        morbid   Myasthenia gravis Mother    Cancer  Father        non hodgkins lymphoma   Cancer Maternal Grandmother        lung   Heart disease Maternal Grandfather        heart attack   COPD Paternal Grandfather    Breast cancer Maternal Aunt     Social:  reports that she has been smoking cigarettes. She has never used smokeless tobacco. She reports current alcohol use. She reports that she does not use drugs.  Allergies: Not on File  Medications: Current Outpatient Medications  Medication Instructions   levothyroxine (SYNTHROID) 25 mcg, Oral, Daily   losartan-hydrochlorothiazide (HYZAAR) 50-12.5 MG tablet TAKE 1 TABLET BY MOUTH DAILY.   omeprazole (PRILOSEC) 20 mg, Oral, Daily   triamcinolone cream (KENALOG) 0.1 % Topical, 2 times daily    ROS - all of the below systems have been reviewed with the patient and positives are indicated with bold text General: chills, fever or night sweats Eyes: blurry vision or double vision ENT: epistaxis or sore throat Allergy/Immunology: itchy/watery eyes or nasal congestion Hematologic/Lymphatic: bleeding problems, blood clots or swollen lymph nodes Endocrine: temperature intolerance or unexpected weight changes Breast: new or changing breast lumps or nipple discharge Resp: cough, shortness of breath, or wheezing CV: chest Mckenzie or dyspnea on exertion GI: as per HPI GU: dysuria, trouble voiding, or hematuria MSK: joint Mckenzie or joint stiffness Neuro: TIA or stroke symptoms  Derm: pruritus and skin lesion changes Psych: anxiety and depression  Objective   PE Blood pressure (!) 141/90, pulse 76, temperature 98.4 F (36.9 C), temperature source Oral, resp. rate 18, SpO2 99 %. Constitutional: NAD; conversant; no deformities Eyes: Moist conjunctiva; no lid lag; anicteric; PERRL Neck: Trachea midline; no thyromegaly Lungs: Normal respiratory effort; no tactile fremitus CV: RRR; no palpable thrills; no pitting edema GI: Abd Soft, mild RUQ tenderness - improvement since the ER while she has  been NPO MSK: Normal range of motion of extremities; no clubbing/cyanosis Psychiatric: Appropriate affect; alert and oriented x3 Lymphatic: No palpable cervical or axillary lymphadenopathy  Results for orders placed or performed during the hospital encounter of 03/17/21 (from the past 24 hour(s))  CBC with Differential     Status: Abnormal   Collection Time: 03/17/21  1:20 AM  Result Value Ref Range   WBC 10.6 (H) 4.0 - 10.5 K/uL   RBC 4.87 3.87 - 5.11 MIL/uL   Hemoglobin 14.2 12.0 - 15.0 g/dL   HCT 44.1 36.0 - 46.0 %   MCV 90.6 80.0 - 100.0 fL   MCH 29.2 26.0 - 34.0 pg   MCHC 32.2 30.0 - 36.0 g/dL   RDW 13.3 11.5 - 15.5 %   Platelets 297 150 - 400 K/uL   nRBC 0.0 0.0 - 0.2 %   Neutrophils Relative % 69 %   Neutro Abs 7.4 1.7 - 7.7 K/uL   Lymphocytes Relative 23 %   Lymphs Abs 2.4 0.7 - 4.0 K/uL   Monocytes Relative 6 %   Monocytes Absolute 0.7 0.1 - 1.0 K/uL   Eosinophils Relative 1 %   Eosinophils Absolute 0.1 0.0 - 0.5 K/uL   Basophils Relative 1 %   Basophils Absolute 0.1 0.0 - 0.1 K/uL   Immature Granulocytes 0 %   Abs Immature Granulocytes 0.04 0.00 - 0.07 K/uL  Comprehensive metabolic panel     Status: Abnormal   Collection Time: 03/17/21  1:20 AM  Result Value Ref Range   Sodium 140 135 - 145 mmol/L   Potassium 3.9 3.5 - 5.1 mmol/L   Chloride 104 98 - 111 mmol/L   CO2 26 22 - 32 mmol/L   Glucose, Bld 96 70 - 99 mg/dL   BUN 17 6 - 20 mg/dL   Creatinine, Ser 0.74 0.44 - 1.00 mg/dL   Calcium 9.5 8.9 - 10.3 mg/dL   Total Protein 7.2 6.5 - 8.1 g/dL   Albumin 4.1 3.5 - 5.0 g/dL   AST 177 (H) 15 - 41 U/L   ALT 230 (H) 0 - 44 U/L   Alkaline Phosphatase 154 (H) 38 - 126 U/L   Total Bilirubin 0.8 0.3 - 1.2 mg/dL   GFR, Estimated >60 >60 mL/min   Anion gap 10 5 - 15  Lipase, blood     Status: None   Collection Time: 03/17/21  1:20 AM  Result Value Ref Range   Lipase 20 11 - 51 U/L     Imaging Orders         US Abdomen Limited RUQ (LIVER/GB)    1. Cholelithiasis  with equivocal findings for acute cholecystitis. A hepatobiliary scintigraphy may provide better evaluation of the gallbladder if there is a high clinical concern for acute cholecystitis . 2. Fatty liver. 3. Right renal cyst.  A positive sonographic Percell Miller sign was noted by the sonographer   Assessment and Plan   Jamie Mckenzie is an 51 y.o. female with acute cholecystitis.  I  recommended laparoscopic cholecystectomy with intraoperative cholangiogram.  The procedure itself as well as its risks, benefits and alternatives were discussed and the patient granted consent to proceed.  We will add her to the surgery schedule for Dr. Donne Hazel who is running the emergency general surgery service this week.  Felicie Morn, MD  [facility] Use [email or web address] to contact on call provider  New Patient Billing: [phone] - High MDM

## 2021-03-17 NOTE — ED Notes (Signed)
US at bedside

## 2021-03-17 NOTE — Discharge Instructions (Signed)
[facility]  Please arrive at least 30 min before your appointment to complete your check in paperwork.  If you are unable to arrive 30 min prior to your appointment time we may have to cancel or reschedule you. LAPAROSCOPIC SURGERY: POST OP INSTRUCTIONS Always review your discharge instruction sheet given to you by the facility where your surgery was performed. IF YOU HAVE DISABILITY OR FAMILY LEAVE FORMS, YOU MUST BRING THEM TO THE OFFICE FOR PROCESSING.   DO NOT GIVE THEM TO YOUR DOCTOR.  PAIN CONTROL  First take acetaminophen (Tylenol) AND/or ibuprofen (Advil) to control your pain after surgery.  Follow directions on package.  Taking acetaminophen (Tylenol) and/or ibuprofen (Advil) regularly after surgery will help to control your pain and lower the amount of prescription pain medication you may need.  You should not take more than 4,000 mg (4 grams) of acetaminophen (Tylenol) in 24 hours.  You should not take ibuprofen (Advil), aleve, motrin, naprosyn or other NSAIDS if you have a history of stomach ulcers or chronic kidney disease.  A prescription for pain medication may be given to you upon discharge.  Take your pain medication as prescribed, if you still have uncontrolled pain after taking acetaminophen (Tylenol) or ibuprofen (Advil). Use ice packs to help control pain. If you need a refill on your pain medication, please contact your pharmacy.  They will contact our office to request authorization. Prescriptions will not be filled after 5pm or on week-ends.  HOME MEDICATIONS Take your usually prescribed medications unless otherwise directed.  DIET You should follow a light diet the first few days after arrival home.  Be sure to include lots of fluids daily. Avoid fatty, fried foods.   CONSTIPATION It is common to experience some constipation after surgery and if you are taking pain medication.  Increasing fluid intake and taking a stool softener (such as Colace)  will usually help or prevent this problem from occurring.  A mild laxative (Milk of Magnesia or Miralax) should be taken according to package instructions if there are no bowel movements after 48 hours.  WOUND/INCISION CARE Most patients will experience some swelling and bruising in the area of the incisions.  Ice packs will help.  Swelling and bruising can take several days to resolve.  Unless discharge instructions indicate otherwise, follow guidelines below  STERI-STRIPS - you may remove your outer bandages 48 hours after surgery, and you may shower at that time.  You have steri-strips (small skin tapes) in place directly over the incision.  These strips should be left on the skin for 7-10 days.   DERMABOND/SKIN GLUE - you may shower in 24 hours.  The glue will flake off over the next 2-3 weeks. Any sutures or staples will be removed at the office during your follow-up visit.  ACTIVITIES You may resume regular (light) daily activities beginning the next day--such as daily self-care, walking, climbing stairs--gradually increasing activities as tolerated.  You may have sexual intercourse when it is comfortable.  Refrain from any heavy lifting or straining until approved by your doctor. You may drive when you are no longer taking prescription pain medication, you can comfortably wear a seatbelt, and you can safely maneuver your car and apply brakes.  FOLLOW-UP You should see your doctor in the office for a follow-up appointment approximately 2-3 weeks after your surgery.  You should have been given your post-op/follow-up appointment when your surgery was scheduled.  If you did not receive a post-op/follow-up appointment, make sure   that you call for this appointment within a day or two after you arrive home to insure a convenient appointment time.   WHEN TO CALL YOUR DOCTOR: Fever over 101.0 Inability to urinate Continued bleeding from incision. Increased pain, redness, or drainage from the  incision. Increasing abdominal pain  The clinic staff is available to answer your questions during regular business hours.  Please don't hesitate to call and ask to speak to one of the nurses for clinical concerns.  If you have a medical emergency, go to the nearest emergency room or call 911.  A surgeon from [facility] is always on call at the hospital. [address], [address], [place], [place]  [address] ? [address] [address], [place],    [address] [phone] ? [phone] ? FAX [phone]  

## 2021-03-18 ENCOUNTER — Encounter (HOSPITAL_COMMUNITY): Payer: Self-pay | Admitting: General Surgery

## 2021-03-18 ENCOUNTER — Other Ambulatory Visit (HOSPITAL_COMMUNITY): Payer: Self-pay

## 2021-03-18 DIAGNOSIS — K8012 Calculus of gallbladder with acute and chronic cholecystitis without obstruction: Secondary | ICD-10-CM | POA: Diagnosis not present

## 2021-03-18 DIAGNOSIS — Z79899 Other long term (current) drug therapy: Secondary | ICD-10-CM | POA: Diagnosis not present

## 2021-03-18 DIAGNOSIS — I10 Essential (primary) hypertension: Secondary | ICD-10-CM | POA: Diagnosis not present

## 2021-03-18 DIAGNOSIS — F17210 Nicotine dependence, cigarettes, uncomplicated: Secondary | ICD-10-CM | POA: Diagnosis not present

## 2021-03-18 DIAGNOSIS — Z20822 Contact with and (suspected) exposure to covid-19: Secondary | ICD-10-CM | POA: Diagnosis not present

## 2021-03-18 DIAGNOSIS — E039 Hypothyroidism, unspecified: Secondary | ICD-10-CM | POA: Diagnosis not present

## 2021-03-18 LAB — CBC
HCT: 43.4 % (ref 36.0–46.0)
Hemoglobin: 13.6 g/dL (ref 12.0–15.0)
MCH: 29.5 pg (ref 26.0–34.0)
MCHC: 31.3 g/dL (ref 30.0–36.0)
MCV: 94.1 fL (ref 80.0–100.0)
Platelets: 318 10*3/uL (ref 150–400)
RBC: 4.61 MIL/uL (ref 3.87–5.11)
RDW: 13.5 % (ref 11.5–15.5)
WBC: 14.9 10*3/uL — ABNORMAL HIGH (ref 4.0–10.5)
nRBC: 0 % (ref 0.0–0.2)

## 2021-03-18 LAB — HEPATIC FUNCTION PANEL
ALT: 240 U/L — ABNORMAL HIGH (ref 0–44)
AST: 126 U/L — ABNORMAL HIGH (ref 15–41)
Albumin: 3.7 g/dL (ref 3.5–5.0)
Alkaline Phosphatase: 178 U/L — ABNORMAL HIGH (ref 38–126)
Bilirubin, Direct: 0.1 mg/dL (ref 0.0–0.2)
Indirect Bilirubin: 0.5 mg/dL (ref 0.3–0.9)
Total Bilirubin: 0.6 mg/dL (ref 0.3–1.2)
Total Protein: 7.2 g/dL (ref 6.5–8.1)

## 2021-03-18 LAB — BASIC METABOLIC PANEL
Anion gap: 8 (ref 5–15)
BUN: 10 mg/dL (ref 6–20)
CO2: 28 mmol/L (ref 22–32)
Calcium: 9.1 mg/dL (ref 8.9–10.3)
Chloride: 101 mmol/L (ref 98–111)
Creatinine, Ser: 1.11 mg/dL — ABNORMAL HIGH (ref 0.44–1.00)
GFR, Estimated: >60 mL/min (ref >60)
Glucose, Bld: 139 mg/dL — ABNORMAL HIGH (ref 70–99)
Potassium: 4.4 mmol/L (ref 3.5–5.1)
Sodium: 137 mmol/L (ref 135–145)

## 2021-03-18 MED ORDER — OXYCODONE HCL 5 MG PO TABS
5.0000 mg | ORAL_TABLET | ORAL | 0 refills | Status: DC | PRN
  Filled 2021-03-18: qty 6, 1d supply, fill #0
  Filled 2021-03-18: qty 4, 1d supply, fill #0
  Filled 2021-03-18: qty 6, 1d supply, fill #0

## 2021-03-18 NOTE — Progress Notes (Signed)
Discharge instructions given to patient, patient verbalizes understanding, ivs removed, jp drain equipment provided. Patient discharged

## 2021-03-18 NOTE — Anesthesia Postprocedure Evaluation (Signed)
Anesthesia Post Note  Patient: Jamie Mckenzie  Procedure(s) Performed: LAPAROSCOPIC CHOLECYSTECTOMY WITH ICG (Abdomen)     Patient location during evaluation: PACU Anesthesia Type: General Level of consciousness: awake and alert Pain management: pain level controlled Vital Signs Assessment: post-procedure vital signs reviewed and stable Respiratory status: spontaneous breathing, nonlabored ventilation, respiratory function stable and patient connected to nasal cannula oxygen Cardiovascular status: blood pressure returned to baseline and stable Postop Assessment: no apparent nausea or vomiting Anesthetic complications: no   No notable events documented.  Last Vitals:  Vitals:   03/18/21 0439 03/18/21 0734  BP: 113/81 119/84  Pulse: 75 79  Resp: 18 17  Temp: 36.8 C 37 C  SpO2: 93% 92%    Last Pain:  Vitals:   03/18/21 0734  TempSrc: Oral  PainSc:                  Annette Bertelson

## 2021-03-18 NOTE — Progress Notes (Signed)
Mobility Specialist Progress Note:   03/18/21 1030  Mobility  Activity Ambulated independently in hallway  Level of Assistance Independent  Assistive Device None  Distance Ambulated (ft) 570 ft  Activity Response Tolerated fair  $Mobility charge 1 Mobility   Pt eager for OOB mobility. Pain increased with ambulation, which pt states is new. Otherwise asx, pt back in room with all needs met.   Nelta Numbers Mobility Specialist  Phone [phone]

## 2021-03-19 LAB — SURGICAL PATHOLOGY

## 2021-03-20 NOTE — Discharge Summary (Signed)
[facility] Discharge Summary   Patient ID: Jamie Mckenzie MRN: [id number] DOB/AGE: 06/29/1970 51 y.o.  Admit date: 03/17/2021 Discharge date: 03/20/2021  Admitting Diagnosis: acute cholecystitis  Discharge Diagnosis Gangrenous cholecystitis s/p laparoscopic choleycystectomy Patient Active Problem List   Diagnosis Date Noted   Acute cholecystitis 03/17/2021   Essential hypertension 12/31/2018   Tobacco abuse 11/10/2011   Hypothyroidism 05/20/2010   MDD (recurrent major depressive disorder) in remission ([place]) 02/01/2008   OCULAR MIGRAINE 02/01/2008   ALLERGIC RHINITIS 02/01/2008   GERD 02/01/2008   FIBROCYSTIC BREAST DISEASE 02/01/2008    Consultants None  Imaging: No results found.  Procedures Dr. Donne Hazel (03/20/21) - Laparoscopic Cholecystectomy  Hospital Course:  51 year old female who [facility] to [facility] ED with right upper quadrant pain.  Workup showed acute cholecystitis.  Patient was transferred to [facility] and admitted and underwent procedure listed above.  Tolerated procedure well and was transferred to the floor.  Diet was advanced as tolerated.  On POD1, the patient was voiding well, tolerating diet, ambulating well, pain well controlled, vital signs stable, incisions c/d/i and felt stable for discharge home. Drain was placed during procedure and she was discharged with drain in place. Patient will follow up in our office in 2-3 weeks and knows to call with questions or concerns.    Physical Exam: General:  Alert, NAD, pleasant, comfortable Abd:  Soft, ND, mild tenderness, incisions C/D/I, drain with minimal serosanguinous drainage    I or a member of my team have reviewed this patient in the Controlled Substance Database.   Allergies as of 03/18/2021   No Known Allergies      Medication List     TAKE these medications    acetaminophen 500 MG tablet Commonly known as: TYLENOL Take 1,000 mg by mouth every 6 (six) hours  as needed for mild pain.   Elderberry 500 MG Caps Take 500 mg by mouth daily.   ibuprofen 200 MG tablet Commonly known as: ADVIL Take 600-800 mg by mouth every 6 (six) hours as needed for headache or mild pain.   levothyroxine 25 MCG tablet Commonly known as: SYNTHROID Take 1 tablet (25 mcg total) by mouth daily.   losartan-hydrochlorothiazide 50-12.5 MG tablet Commonly known as: HYZAAR TAKE 1 TABLET BY MOUTH DAILY.   MULTI FOR HER PO Take 1 tablet by mouth daily.   omeprazole 20 MG capsule Commonly known as: PRILOSEC Take 1 capsule (20 mg total) by mouth daily.   oxyCODONE 5 MG immediate release tablet Commonly known as: Oxy IR/ROXICODONE Take 1 tablet (5 mg total) by mouth every 4 (four) hours as needed for moderate pain or severe pain (5mg  for moderate pain, 10mg  for severe pain).   triamcinolone cream 0.1 % Commonly known as: KENALOG Apply topically 2 (two) times daily as needed (rash).   vitamin C 1000 MG tablet Take 1,000 mg by mouth daily.          Follow-up Information     Rolm Bookbinder, MD Follow up on 03/27/2021.   Specialty: General Surgery Why: 340pm. Please arrive 30 minutes prior to your appointment for paperwork. Please bring a copy of your photo ID and insurance card. Contact information: [address] [place] [address] [phone]                 Signed: Winferd Humphrey , [facility] 03/20/2021, 3:11 PM Please see Amion for pager number during day hours 7:00am-4:30pm

## 2021-03-24 ENCOUNTER — Other Ambulatory Visit (HOSPITAL_COMMUNITY): Payer: Self-pay

## 2021-03-24 MED FILL — Losartan Potassium & Hydrochlorothiazide Tab 50-12.5 MG: ORAL | 30 days supply | Qty: 30 | Fill #4 | Status: AC

## 2021-03-26 ENCOUNTER — Observation Stay (HOSPITAL_COMMUNITY): Payer: 59

## 2021-03-26 ENCOUNTER — Observation Stay (HOSPITAL_COMMUNITY): Payer: 59 | Admitting: Anesthesiology

## 2021-03-26 ENCOUNTER — Emergency Department (HOSPITAL_COMMUNITY): Payer: 59

## 2021-03-26 ENCOUNTER — Encounter (HOSPITAL_COMMUNITY)
Admission: EM | Disposition: A | Discharge status: Home / Self Care | Payer: Self-pay | Source: Home / Self Care | Attending: Emergency Medicine

## 2021-03-26 ENCOUNTER — Other Ambulatory Visit: Payer: Self-pay

## 2021-03-26 ENCOUNTER — Observation Stay (HOSPITAL_COMMUNITY)
Admission: EM | Admit: 2021-03-26 | Discharge: 2021-03-27 | Disposition: A | Discharge status: Home / Self Care | Payer: 59 | Attending: Surgery | Admitting: Surgery

## 2021-03-26 ENCOUNTER — Encounter (HOSPITAL_COMMUNITY): Payer: Self-pay | Admitting: Emergency Medicine

## 2021-03-26 DIAGNOSIS — Z9049 Acquired absence of other specified parts of digestive tract: Secondary | ICD-10-CM | POA: Insufficient documentation

## 2021-03-26 DIAGNOSIS — K8050 Calculus of bile duct without cholangitis or cholecystitis without obstruction: Principal | ICD-10-CM

## 2021-03-26 DIAGNOSIS — I10 Essential (primary) hypertension: Secondary | ICD-10-CM | POA: Insufficient documentation

## 2021-03-26 DIAGNOSIS — K839 Disease of biliary tract, unspecified: Secondary | ICD-10-CM | POA: Diagnosis present

## 2021-03-26 DIAGNOSIS — F17210 Nicotine dependence, cigarettes, uncomplicated: Secondary | ICD-10-CM | POA: Diagnosis not present

## 2021-03-26 DIAGNOSIS — E039 Hypothyroidism, unspecified: Secondary | ICD-10-CM | POA: Insufficient documentation

## 2021-03-26 DIAGNOSIS — R079 Chest pain, unspecified: Secondary | ICD-10-CM | POA: Diagnosis not present

## 2021-03-26 DIAGNOSIS — R932 Abnormal findings on diagnostic imaging of liver and biliary tract: Secondary | ICD-10-CM | POA: Diagnosis not present

## 2021-03-26 DIAGNOSIS — G8918 Other acute postprocedural pain: Secondary | ICD-10-CM | POA: Diagnosis not present

## 2021-03-26 DIAGNOSIS — R109 Unspecified abdominal pain: Secondary | ICD-10-CM | POA: Diagnosis not present

## 2021-03-26 DIAGNOSIS — Z0389 Encounter for observation for other suspected diseases and conditions ruled out: Secondary | ICD-10-CM | POA: Diagnosis not present

## 2021-03-26 DIAGNOSIS — K219 Gastro-esophageal reflux disease without esophagitis: Secondary | ICD-10-CM | POA: Diagnosis not present

## 2021-03-26 DIAGNOSIS — K838 Other specified diseases of biliary tract: Secondary | ICD-10-CM | POA: Insufficient documentation

## 2021-03-26 DIAGNOSIS — R9431 Abnormal electrocardiogram [ECG] [EKG]: Secondary | ICD-10-CM | POA: Diagnosis not present

## 2021-03-26 DIAGNOSIS — Z20822 Contact with and (suspected) exposure to covid-19: Secondary | ICD-10-CM | POA: Diagnosis not present

## 2021-03-26 DIAGNOSIS — R748 Abnormal levels of other serum enzymes: Secondary | ICD-10-CM

## 2021-03-26 DIAGNOSIS — R1011 Right upper quadrant pain: Secondary | ICD-10-CM | POA: Diagnosis not present

## 2021-03-26 DIAGNOSIS — R1031 Right lower quadrant pain: Secondary | ICD-10-CM | POA: Diagnosis present

## 2021-03-26 DIAGNOSIS — K819 Cholecystitis, unspecified: Secondary | ICD-10-CM

## 2021-03-26 HISTORY — PX: REMOVAL OF STONES: SHX5545

## 2021-03-26 HISTORY — PX: ENDOSCOPIC RETROGRADE CHOLANGIOPANCREATOGRAPHY (ERCP) WITH PROPOFOL: SHX5810

## 2021-03-26 HISTORY — PX: SPHINCTEROTOMY: SHX5544

## 2021-03-26 HISTORY — PX: BILIARY STENT PLACEMENT: SHX5538

## 2021-03-26 LAB — CBC
HCT: 45.1 % (ref 36.0–46.0)
Hemoglobin: 14.7 g/dL (ref 12.0–15.0)
MCH: 30.1 pg (ref 26.0–34.0)
MCHC: 32.6 g/dL (ref 30.0–36.0)
MCV: 92.2 fL (ref 80.0–100.0)
Platelets: 360 10*3/uL (ref 150–400)
RBC: 4.89 MIL/uL (ref 3.87–5.11)
RDW: 13.2 % (ref 11.5–15.5)
WBC: 10.3 10*3/uL (ref 4.0–10.5)
nRBC: 0 % (ref 0.0–0.2)

## 2021-03-26 LAB — COMPREHENSIVE METABOLIC PANEL
ALT: 149 U/L — ABNORMAL HIGH (ref 0–44)
AST: 228 U/L — ABNORMAL HIGH (ref 15–41)
Albumin: 3.7 g/dL (ref 3.5–5.0)
Alkaline Phosphatase: 166 U/L — ABNORMAL HIGH (ref 38–126)
Anion gap: 8 (ref 5–15)
BUN: 20 mg/dL (ref 6–20)
CO2: 28 mmol/L (ref 22–32)
Calcium: 9.6 mg/dL (ref 8.9–10.3)
Chloride: 102 mmol/L (ref 98–111)
Creatinine, Ser: 0.91 mg/dL (ref 0.44–1.00)
GFR, Estimated: >60 mL/min (ref >60)
Glucose, Bld: 116 mg/dL — ABNORMAL HIGH (ref 70–99)
Potassium: 3.6 mmol/L (ref 3.5–5.1)
Sodium: 138 mmol/L (ref 135–145)
Total Bilirubin: 0.8 mg/dL (ref 0.3–1.2)
Total Protein: 7.3 g/dL (ref 6.5–8.1)

## 2021-03-26 LAB — RESP PANEL BY RT-PCR (FLU A&B, COVID) ARPGX2
Influenza A by PCR: NEGATIVE
Influenza B by PCR: NEGATIVE
SARS Coronavirus 2 by RT PCR: NEGATIVE

## 2021-03-26 LAB — I-STAT BETA HCG BLOOD, ED (MC, WL, AP ONLY): I-stat hCG, quantitative: <5 m[IU]/mL (ref <5)

## 2021-03-26 LAB — LIPASE, BLOOD: Lipase: 26 U/L (ref 11–51)

## 2021-03-26 LAB — LACTIC ACID, PLASMA
Lactic Acid, Venous: 2.3 mmol/L (ref 0.5–1.9)
Lactic Acid, Venous: 1.6 mmol/L (ref 0.5–1.9)

## 2021-03-26 SURGERY — ENDOSCOPIC RETROGRADE CHOLANGIOPANCREATOGRAPHY (ERCP) WITH PROPOFOL
Anesthesia: General

## 2021-03-26 MED ORDER — SODIUM CHLORIDE 0.9 % IV BOLUS
1000.0000 mL | Freq: Once | INTRAVENOUS | Status: AC
Start: 2021-03-26 — End: 2021-03-26
  Administered 2021-03-26: 1000 mL via INTRAVENOUS

## 2021-03-26 MED ORDER — OXYCODONE HCL 5 MG/5ML PO SOLN: 5.0000 mg | Freq: Once | ORAL | Status: DC | PRN

## 2021-03-26 MED ORDER — FENTANYL CITRATE (PF) 100 MCG/2ML IJ SOLN: 25.0000 ug | INTRAMUSCULAR | Status: DC | PRN

## 2021-03-26 MED ORDER — ONDANSETRON HCL 4 MG/2ML IJ SOLN: 4.0000 mg | Freq: Four times a day (QID) | INTRAMUSCULAR | Status: DC | PRN

## 2021-03-26 MED ORDER — MORPHINE SULFATE (PF) 4 MG/ML IV SOLN
4.0000 mg | Freq: Once | INTRAVENOUS | Status: AC
  Administered 2021-03-26: 4 mg via INTRAVENOUS
  Filled 2021-03-26: qty 1

## 2021-03-26 MED ORDER — ENOXAPARIN SODIUM 40 MG/0.4ML IJ SOSY: 40.0000 mg | PREFILLED_SYRINGE | Freq: Every day | INTRAMUSCULAR | Status: DC

## 2021-03-26 MED ORDER — ACETAMINOPHEN 325 MG PO TABS: 650.0000 mg | ORAL_TABLET | Freq: Four times a day (QID) | ORAL | Status: DC | PRN

## 2021-03-26 MED ORDER — OXYCODONE HCL 5 MG PO TABS: 5.0000 mg | ORAL_TABLET | Freq: Once | ORAL | Status: DC | PRN

## 2021-03-26 MED ORDER — LIDOCAINE 2% (20 MG/ML) 5 ML SYRINGE
INTRAMUSCULAR | Status: DC | PRN
  Administered 2021-03-26: 60 mg via INTRAVENOUS

## 2021-03-26 MED ORDER — DIAZEPAM 5 MG/ML IJ SOLN: 2.5000 mg | Freq: Four times a day (QID) | INTRAMUSCULAR | Status: DC | PRN

## 2021-03-26 MED ORDER — DIAZEPAM 5 MG/ML IJ SOLN
2.5000 mg | Freq: Once | INTRAMUSCULAR | Status: AC
  Administered 2021-03-26: 2.5 mg via INTRAVENOUS
  Filled 2021-03-26: qty 2

## 2021-03-26 MED ORDER — DIPHENHYDRAMINE HCL 25 MG PO CAPS: 25.0000 mg | ORAL_CAPSULE | Freq: Four times a day (QID) | ORAL | Status: DC | PRN

## 2021-03-26 MED ORDER — ROCURONIUM BROMIDE 10 MG/ML (PF) SYRINGE
PREFILLED_SYRINGE | INTRAVENOUS | Status: DC | PRN
  Administered 2021-03-26: 100 mg via INTRAVENOUS

## 2021-03-26 MED ORDER — IOHEXOL 300 MG/ML  SOLN
100.0000 mL | Freq: Once | INTRAMUSCULAR | Status: AC | PRN
  Administered 2021-03-26: 100 mL via INTRAVENOUS

## 2021-03-26 MED ORDER — METOPROLOL TARTRATE 5 MG/5ML IV SOLN: 5.0000 mg | Freq: Four times a day (QID) | INTRAVENOUS | Status: DC | PRN

## 2021-03-26 MED ORDER — PROPOFOL 10 MG/ML IV BOLUS
INTRAVENOUS | Status: DC | PRN
  Administered 2021-03-26: 200 mg via INTRAVENOUS

## 2021-03-26 MED ORDER — FENTANYL CITRATE (PF) 100 MCG/2ML IJ SOLN
INTRAMUSCULAR | Status: DC | PRN
  Administered 2021-03-26: 100 ug via INTRAVENOUS

## 2021-03-26 MED ORDER — SIMETHICONE 80 MG PO CHEW: 40.0000 mg | CHEWABLE_TABLET | Freq: Four times a day (QID) | ORAL | Status: DC | PRN

## 2021-03-26 MED ORDER — SODIUM CHLORIDE 0.9 % IV SOLN: Freq: Once | INTRAVENOUS | Status: AC

## 2021-03-26 MED ORDER — HYDROMORPHONE HCL 1 MG/ML IJ SOLN
1.0000 mg | Freq: Once | INTRAMUSCULAR | Status: AC
  Administered 2021-03-26: 1 mg via INTRAVENOUS
  Filled 2021-03-26: qty 1

## 2021-03-26 MED ORDER — INDOMETHACIN 50 MG RE SUPP: 100.0000 mg | Freq: Once | RECTAL | Status: DC

## 2021-03-26 MED ORDER — PIPERACILLIN-TAZOBACTAM 3.375 G IVPB 30 MIN
3.3750 g | Freq: Once | INTRAVENOUS | Status: AC
Start: 2021-03-26 — End: 2021-03-26
  Administered 2021-03-26: 3.375 g via INTRAVENOUS
  Filled 2021-03-26: qty 50

## 2021-03-26 MED ORDER — ONDANSETRON 4 MG PO TBDP: 4.0000 mg | ORAL_TABLET | Freq: Four times a day (QID) | ORAL | Status: DC | PRN

## 2021-03-26 MED ORDER — FENTANYL CITRATE (PF) 100 MCG/2ML IJ SOLN
INTRAMUSCULAR | Status: AC
  Filled 2021-03-26: qty 2

## 2021-03-26 MED ORDER — INDOMETHACIN 50 MG RE SUPP
RECTAL | Status: AC
  Filled 2021-03-26: qty 2

## 2021-03-26 MED ORDER — LACTATED RINGERS IV SOLN: INTRAVENOUS | Status: DC | PRN

## 2021-03-26 MED ORDER — GLUCAGON HCL RDNA (DIAGNOSTIC) 1 MG IJ SOLR
INTRAMUSCULAR | Status: DC | PRN
Start: 2021-03-26 — End: 2021-03-26
  Administered 2021-03-26 (×3): .25 mg via INTRAVENOUS

## 2021-03-26 MED ORDER — PANTOPRAZOLE SODIUM 40 MG IV SOLR
40.0000 mg | Freq: Every day | INTRAVENOUS | Status: DC
  Administered 2021-03-26: 40 mg via INTRAVENOUS
  Filled 2021-03-26: qty 40

## 2021-03-26 MED ORDER — ACETAMINOPHEN 650 MG RE SUPP: 650.0000 mg | Freq: Four times a day (QID) | RECTAL | Status: DC | PRN

## 2021-03-26 MED ORDER — PIPERACILLIN-TAZOBACTAM 3.375 G IVPB
3.3750 g | Freq: Three times a day (TID) | INTRAVENOUS | Status: DC
  Administered 2021-03-27: 3.375 g via INTRAVENOUS
  Filled 2021-03-26: qty 50

## 2021-03-26 MED ORDER — INDOMETHACIN 50 MG RE SUPP
RECTAL | Status: DC | PRN
  Administered 2021-03-26: 100 mg via RECTAL

## 2021-03-26 MED ORDER — ONDANSETRON HCL 4 MG/2ML IJ SOLN: 4.0000 mg | Freq: Once | INTRAMUSCULAR | Status: DC | PRN

## 2021-03-26 MED ORDER — METHOCARBAMOL 1000 MG/10ML IJ SOLN
500.0000 mg | Freq: Four times a day (QID) | INTRAVENOUS | Status: DC | PRN
  Filled 2021-03-26 (×2): qty 5

## 2021-03-26 MED ORDER — ONDANSETRON HCL 4 MG/2ML IJ SOLN
INTRAMUSCULAR | Status: DC | PRN
  Administered 2021-03-26: 4 mg via INTRAVENOUS

## 2021-03-26 MED ORDER — SODIUM CHLORIDE 0.9 % IV SOLN: INTRAVENOUS | Status: DC

## 2021-03-26 MED ORDER — METHOCARBAMOL 500 MG PO TABS: 500.0000 mg | ORAL_TABLET | Freq: Four times a day (QID) | ORAL | Status: DC | PRN

## 2021-03-26 MED ORDER — DOCUSATE SODIUM 100 MG PO CAPS
100.0000 mg | ORAL_CAPSULE | Freq: Two times a day (BID) | ORAL | Status: DC
  Administered 2021-03-26: 100 mg via ORAL
  Filled 2021-03-26: qty 1

## 2021-03-26 MED ORDER — SUGAMMADEX SODIUM 200 MG/2ML IV SOLN
INTRAVENOUS | Status: DC | PRN
  Administered 2021-03-26: 600 mg via INTRAVENOUS

## 2021-03-26 MED ORDER — ONDANSETRON HCL 4 MG/2ML IJ SOLN
4.0000 mg | Freq: Once | INTRAMUSCULAR | Status: AC | PRN
  Administered 2021-03-26: 4 mg via INTRAVENOUS
  Filled 2021-03-26: qty 2

## 2021-03-26 MED ORDER — HYDROMORPHONE HCL 1 MG/ML IJ SOLN
1.0000 mg | INTRAMUSCULAR | Status: DC | PRN
  Administered 2021-03-26 (×2): 1 mg via INTRAVENOUS
  Filled 2021-03-26 (×2): qty 1

## 2021-03-26 MED ORDER — DIPHENHYDRAMINE HCL 50 MG/ML IJ SOLN: 25.0000 mg | Freq: Four times a day (QID) | INTRAMUSCULAR | Status: DC | PRN

## 2021-03-26 MED ORDER — POLYETHYLENE GLYCOL 3350 17 G PO PACK: 17.0000 g | PACK | Freq: Every day | ORAL | Status: DC | PRN

## 2021-03-26 MED ORDER — SODIUM CHLORIDE 0.9 % IV SOLN
INTRAVENOUS | Status: DC | PRN
  Administered 2021-03-26: 40 mL

## 2021-03-26 NOTE — ED Triage Notes (Addendum)
Pt s/p cholecystectomy on 1/23.  Woke up with RUQ pain "under ribcage" this morning.  Pain severe with nausea over 1 hour. JP drain in place.  Dr. Donne Hazel aware and called orders in.

## 2021-03-26 NOTE — Progress Notes (Signed)
Patient arrived to [place] room 16. Alert and oriented x4. Bed in lowest position call light in reach

## 2021-03-26 NOTE — Transfer of Care (Signed)
Immediate Anesthesia Transfer of Care Note  Patient: Jamie Mckenzie  Procedure(s) Performed: ENDOSCOPIC RETROGRADE CHOLANGIOPANCREATOGRAPHY (ERCP) WITH PROPOFOL SPHINCTEROTOMY REMOVAL OF STONES BILIARY STENT PLACEMENT  Patient Location: PACU  Anesthesia Type:General  Level of Consciousness: drowsy and patient cooperative  Airway & Oxygen Therapy: Patient Spontanous Breathing and Patient connected to nasal cannula oxygen  Post-op Assessment: Report given to RN and Post -op Vital signs reviewed and stable  Post vital signs: Reviewed and stable  Last Vitals:  Vitals Value Taken Time  BP 157/94 03/26/21 1707  Temp    Pulse 105 03/26/21 1709  Resp 18 03/26/21 1709  SpO2 93 % 03/26/21 1709  Vitals shown include unvalidated device data.  Last Pain:  Vitals:   03/26/21 1520  TempSrc: Temporal  PainSc: 6          Complications: No notable events documented.

## 2021-03-26 NOTE — Anesthesia Preprocedure Evaluation (Addendum)
Anesthesia Evaluation  Patient identified by MRN, date of birth, ID band Patient awake    Reviewed: Allergy & Precautions, NPO status , Patient's Chart, lab work & pertinent test results  Airway Mallampati: III  TM Distance: >3 FB Neck ROM: Full    Dental  (+) Dental Advisory Given, Poor Dentition   Pulmonary Current Smoker and Patient abstained from smoking.,     + decreased breath sounds      Cardiovascular hypertension, Pt. on medications Normal cardiovascular exam Rhythm:Regular Rate:Normal     Neuro/Psych  Headaches, PSYCHIATRIC DISORDERS Depression    GI/Hepatic GERD  Controlled and Medicated,POD#9 s/p lap CCY now with suspected bile leak   Endo/Other  Hypothyroidism Morbid obesityBMI 50  Renal/GU negative Renal ROS  negative genitourinary   Musculoskeletal  (+) Arthritis , Osteoarthritis,    Abdominal (+) + obese,   Peds  Hematology negative hematology ROS (+)   Anesthesia Other Findings   Reproductive/Obstetrics negative OB ROS                            Anesthesia Physical Anesthesia Plan  ASA: 3  Anesthesia Plan: General   Post-op Pain Management:    Induction: Intravenous  PONV Risk Score and Plan: 2 and Ondansetron, Dexamethasone and Midazolam  Airway Management Planned: Oral ETT  Additional Equipment: None  Intra-op Plan:   Post-operative Plan: Extubation in OR  Informed Consent: I have reviewed the patients History and Physical, chart, labs and discussed the procedure including the risks, benefits and alternatives for the proposed anesthesia with the patient or authorized representative who has indicated his/her understanding and acceptance.     Dental advisory given  Plan Discussed with: CRNA  Anesthesia Plan Comments: (Airway note from previous surgery: DL x1 with Mac 3, grade III view. Elected to use Glidescope. Grade 1 view obtained with successful  intubation.   )        Anesthesia Quick Evaluation

## 2021-03-26 NOTE — ED Notes (Signed)
Patient returned from CT, unable to tolerate sitting in bed at this time. Currently standing and leaning over bed. Patient reports pain is increasing again. Family at bedside and updated on plan of care.

## 2021-03-26 NOTE — Anesthesia Postprocedure Evaluation (Signed)
Anesthesia Post Note  Patient: Jamie Mckenzie  Procedure(s) Performed: ENDOSCOPIC RETROGRADE CHOLANGIOPANCREATOGRAPHY (ERCP) WITH PROPOFOL SPHINCTEROTOMY REMOVAL OF STONES BILIARY STENT PLACEMENT     Patient location during evaluation: PACU Anesthesia Type: General Level of consciousness: awake and alert, oriented and patient cooperative Pain management: pain level controlled Vital Signs Assessment: post-procedure vital signs reviewed and stable Respiratory status: spontaneous breathing, nonlabored ventilation and respiratory function stable Cardiovascular status: blood pressure returned to baseline and stable Postop Assessment: no apparent nausea or vomiting Anesthetic complications: no   No notable events documented.  Last Vitals:  Vitals:   03/26/21 1720 03/26/21 1735  BP: (!) 148/79 (!) 164/71  Pulse: 88 86  Resp: 15 16  Temp:  36.8 C  SpO2: 97% 94%    Last Pain:  Vitals:   03/26/21 1735  TempSrc:   PainSc: 0-No pain                 Pervis Hocking

## 2021-03-26 NOTE — Anesthesia Procedure Notes (Signed)
Procedure Name: Intubation Date/Time: 03/26/2021 3:49 PM Performed by: Georgia Duff, CRNA Pre-anesthesia Checklist: Patient identified, Emergency Drugs available, Suction available and Patient being monitored Patient Re-evaluated:Patient Re-evaluated prior to induction Oxygen Delivery Method: Circle System Utilized Preoxygenation: Pre-oxygenation with 100% oxygen Induction Type: IV induction Ventilation: Mask ventilation without difficulty Laryngoscope Size: Glidescope and 3 Grade View: Grade I Tube type: Oral Tube size: 7.0 mm Number of attempts: 1 Airway Equipment and Method: Stylet and Oral airway Placement Confirmation: ETT inserted through vocal cords under direct vision, positive ETCO2 and breath sounds checked- equal and bilateral Secured at: 21 cm Tube secured with: Tape Dental Injury: Teeth and Oropharynx as per pre-operative assessment

## 2021-03-26 NOTE — H&P (Signed)
Jamie Mckenzie is an 51 y.o. female.   Chief Complaint: ab pain HPI: 38 yof who I did a lap chole on last Monday for gangrenous cholecystitis. I left a drain in place. She had been doing well until this around 830 this morning.  She had acute onset of abdominal pain.  She had no fever.  She has some nausea, no emesis.  She has been having bms.  Her drain appeared serosang but changed character this am and appears more bilious. Pain not controlled at home and led her to come to er.   Past Medical History:  Diagnosis Date   Abrasion of right wrist 04/15/2012   Arthritis    ankles   Cough 04/15/2012   Dental crowns present    GERD (gastroesophageal reflux disease)    Hypertension    Hypothyroidism    Perimenopausal 03/26/2012   irregular periods   Retained orthopedic hardware 03/26/2012   left ankle   Stuffy and runny nose    clear drainage from nose - URI onset 03/30/2012    Past Surgical History:  Procedure Laterality Date   BREAST BIOPSY Left 10/23/2014   Benign   CHOLECYSTECTOMY N/A 03/17/2021   Procedure: LAPAROSCOPIC CHOLECYSTECTOMY WITH ICG;  Surgeon: Rolm Bookbinder, MD;  Location: [facility];  Service: General;  Laterality: N/A;   DILATION AND EVACUATION  11/15/2008   HARDWARE REMOVAL Left 04/21/2012   Procedure: HARDWARE REMOVAL;  Surgeon: Wylene Simmer, MD;  Location: [place];  Service: Orthopedics;  Laterality: Left;  LEFT ANKLE HARDWARE REMOVAL   HYSTEROSCOPY WITH D & C  10/24/2009   with polypectomy   MULTIPLE TOOTH EXTRACTIONS     ORIF ANKLE FRACTURE Right 1985   ORIF ANKLE FRACTURE  11/03/2011   Procedure: OPEN REDUCTION INTERNAL FIXATION (ORIF) ANKLE FRACTURE;  Surgeon: Wylene Simmer, MD;  Location: [place];  Service: Orthopedics;  Laterality: Left;  Open Reduction Internal Fixation left ankle lateral malleolus fracture, left syndesmosis    Family History  Problem Relation Age of Onset   Hypertension Mother    Arthritis Mother     Obesity Mother        morbid   Myasthenia gravis Mother    Cancer Father        non hodgkins lymphoma   Cancer Maternal Grandmother        lung   Heart disease Maternal Grandfather        heart attack   COPD Paternal Grandfather    Breast cancer Maternal Aunt    Social History:  reports that she has been smoking cigarettes. She has never used smokeless tobacco. She reports current alcohol use. She reports that she does not use drugs.  Allergies: No Known Allergies  (Not in a hospital admission)   No results found for this or any previous visit (from the past 48 hour(s)). No results found.  Review of Systems  Constitutional:  Negative for fever.  Gastrointestinal:  Positive for abdominal pain and nausea. Negative for constipation and vomiting.  All other systems reviewed and are negative.  Blood pressure 140/84, pulse 91, temperature 98 F (36.7 C), temperature source Axillary, resp. rate (!) 29, SpO2 99 %. Physical Exam Constitutional:      General: She is in acute distress.  Eyes:     General: No scleral icterus. Cardiovascular:     Rate and Rhythm: Normal rate.  Pulmonary:     Effort: Pulmonary effort is normal.  Abdominal:     Tenderness:  There is generalized abdominal tenderness.     Comments: Drain appears bilious this am  Neurological:     Mental Status: She is alert.     Assessment/Plan Ab pain s/p lap chole -labs and ct scan pending and will go from there  Rolm Bookbinder, MD 03/26/2021, 10:11 AM

## 2021-03-26 NOTE — Progress Notes (Signed)
Pharmacy Antibiotic Note  Jamie Mckenzie is a 51 y.o. female admitted on 03/26/2021 with  intra-abdominal infection .  Pharmacy has been consulted for Zosyn dosing. SCr 1.11.  Plan: Zosyn 3.375g IV (76min infusion) x1; then 3.375g IV q8h (4h infusion) Monitor clinical progress, c/s, renal function F/u de-escalation plan/LOT     Temp (24hrs), Avg:98 F (36.7 C), Min:98 F (36.7 C), Max:98 F (36.7 C)  Recent Labs  Lab 03/26/21 0940  WBC 10.3    Estimated Creatinine Clearance: 84.6 mL/min (A) (by C-G formula based on SCr of 1.11 mg/dL (H)).    No Known Allergies  Arturo Morton, PharmD, BCPS Please check AMION for all [place] contact numbers Clinical Pharmacist 03/26/2021 10:47 AM

## 2021-03-26 NOTE — ED Provider Notes (Signed)
[facility] EMERGENCY DEPARTMENT Provider Note   CSN: [id number] Arrival date & time: 03/26/21  4097     History  Chief Complaint  Patient presents with   Abdominal Pain    NYLAH BUTKUS is a 51 y.o. female.  The history is provided by the patient.  Abdominal Pain Pain location:  RLQ and RUQ Pain quality: sharp   Pain radiates to:  Does not radiate Pain severity:  Severe Onset quality:  Gradual Duration:  3 hours Timing:  Constant Progression:  Unchanged Chronicity:  New Context: previous surgery (Gallbladder removed 1 one week ago with JP drain in place. Worsening pain this AM, increased fluid from drain this morning after nothing have much the last several days)   Relieved by:  Nothing Worsened by:  Nothing Associated symptoms: nausea   Associated symptoms: no chills, no constipation, no cough, no diarrhea, no dysuria, no fever and no vomiting   Risk factors: multiple surgeries       Home Medications Prior to Admission medications   Medication Sig Start Date End Date Taking? Authorizing Provider  acetaminophen (TYLENOL) 500 MG tablet Take 1,000 mg by mouth every 6 (six) hours as needed for mild pain.   Yes [provider]  Ascorbic Acid (VITAMIN C) 1000 MG tablet Take 1,000 mg by mouth daily.   Yes [provider]  docusate sodium (COLACE) 100 MG capsule Take 100 mg by mouth daily as needed for mild constipation.   Yes [provider]  Elderberry 500 MG CAPS Take 500 mg by mouth daily.   Yes [provider]  ibuprofen (ADVIL) 200 MG tablet Take 600-800 mg by mouth every 6 (six) hours as needed for headache or mild pain.   Yes [provider]  levothyroxine (SYNTHROID) 25 MCG tablet Take 1 tablet (25 mcg total) by mouth daily. 08/15/20  Yes Bedsole, Amy E, MD  losartan-hydrochlorothiazide (HYZAAR) 50-12.5 MG tablet TAKE 1 TABLET BY MOUTH DAILY. 04/30/20 04/30/21 Yes Bedsole, Amy E, MD  Multiple  Vitamins-Minerals (MULTI FOR HER PO) Take 1 tablet by mouth daily.   Yes [provider]  omeprazole (PRILOSEC) 20 MG capsule Take 1 capsule (20 mg total) by mouth daily. 05/21/16  Yes Bedsole, Amy E, MD  triamcinolone cream (KENALOG) 0.1 % Apply topically 2 (two) times daily as needed (rash). 12/13/19  Yes [provider]  oxyCODONE (OXY IR/ROXICODONE) 5 MG immediate release tablet Take 1 tablet (5 mg total) by mouth every 4 (four) hours as needed for moderate pain or severe pain (61m for moderate pain, 178mfor severe pain). Patient not taking: Reported on 03/26/2021 03/18/21   WaRolm BookbinderMD      Allergies    Patient has no known allergies.    Review of Systems   Review of Systems  Constitutional:  Negative for chills and fever.  Respiratory:  Negative for cough.   Gastrointestinal:  Positive for abdominal pain and nausea. Negative for constipation, diarrhea and vomiting.  Genitourinary:  Negative for dysuria.   Physical Exam Updated Vital Signs BP (!) 122/57    Pulse 99    Temp 98 F (36.7 C) (Axillary)    Resp 19    SpO2 92%  Physical Exam Vitals and nursing note reviewed.  Constitutional:      General: She is in acute distress.     Appearance: She is well-developed. She is ill-appearing.  HENT:     Head: Normocephalic and atraumatic.  Eyes:  Conjunctiva/sclera: Conjunctivae normal.  Cardiovascular:     Rate and Rhythm: Normal rate and regular rhythm.     Heart sounds: Normal heart sounds. No murmur heard. Pulmonary:     Effort: Pulmonary effort is normal. No respiratory distress.     Breath sounds: Normal breath sounds.  Abdominal:     General: Bowel sounds are normal.     Palpations: Abdomen is soft.     Tenderness: There is abdominal tenderness in the right upper quadrant and right lower quadrant. There is guarding.     Hernia: No hernia is present.     Comments: Tenderness to right side of abdomen, JP drain with dark clear fluid about 50 to  60 cc  Musculoskeletal:        General: No swelling.     Cervical back: Neck supple.  Skin:    General: Skin is warm and dry.     Capillary Refill: Capillary refill takes less than 2 seconds.  Neurological:     Mental Status: She is alert.  Psychiatric:        Mood and Affect: Mood normal.    ED Results / Procedures / Treatments   Labs (all labs ordered are listed, but only abnormal results are displayed) Labs Reviewed  COMPREHENSIVE METABOLIC PANEL - Abnormal; Notable for the following components:      Result Value   Glucose, Bld 116 (*)    AST 228 (*)    ALT 149 (*)    Alkaline Phosphatase 166 (*)    All other components within normal limits  LACTIC ACID, PLASMA - Abnormal; Notable for the following components:   Lactic Acid, Venous 2.3 (*)    All other components within normal limits  RESP PANEL BY RT-PCR (FLU A&B, COVID) ARPGX2  CULTURE, BLOOD (ROUTINE X 2)  CULTURE, BLOOD (ROUTINE X 2)  LIPASE, BLOOD  CBC  URINALYSIS, ROUTINE W REFLEX MICROSCOPIC  LACTIC ACID, PLASMA  I-STAT BETA HCG BLOOD, ED ([facility], [facility], [facility] ONLY)    EKG EKG Interpretation  Date/Time:  Wednesday March 26 2021 10:40:35 EST Ventricular Rate:  83 PR Interval:  163 QRS Duration: 97 QT Interval:  365 QTC Calculation: 429 R Axis:   51 Text Interpretation: Sinus rhythm Low voltage, precordial leads Confirmed by Lennice Sites ([id number]) on 03/26/2021 10:48:03 AM  Radiology CT ABDOMEN PELVIS W CONTRAST  Result Date: 03/26/2021 CLINICAL DATA:  Status post cholecystectomy. Worsening pain with progressive drainage from JP drain. EXAM: CT ABDOMEN AND PELVIS WITH CONTRAST TECHNIQUE: Multidetector CT imaging of the abdomen and pelvis was performed using the standard protocol following bolus administration of intravenous contrast. RADIATION DOSE REDUCTION: This exam was performed according to the departmental dose-optimization program which includes automated exposure control, adjustment of the mA and/or kV  according to patient size and/or use of iterative reconstruction technique. CONTRAST:  175m OMNIPAQUE IOHEXOL 300 MG/ML  SOLN COMPARISON:  02/05/2020 FINDINGS: Lower chest: No acute abnormality. Hepatobiliary: There is no suspicious liver lesion identified. Previous cholecystectomy. Within the gallbladder fossa there is a small collection of intermediate attenuation material measuring 5.0 x 2.8 x 2.4 cm (volume = 18 cm^3), image 35/3. In the early postoperative time frame this is a nonspecific finding and may represent postoperative seroma, hematoma and/or Surgicel. A surgical drain is identified which enters from a right lateral abdominal approach. The tip terminates along the inferior medial margin of the right lobe of liver. The common bile duct measures up to 9 mm, image 103/7. There is mild intrahepatic  bile duct dilatation. No CT visible stones identified within the common bile duct. Pancreas: Unremarkable. No pancreatic ductal dilatation or surrounding inflammatory changes. Spleen: Normal in size without focal abnormality. Adrenals/Urinary Tract: Normal adrenal glands. Upper pole of right kidney cyst measures 4.0 x 4.9 cm. No hydronephrosis or mass identified bilaterally. Urinary bladder is unremarkable. Stomach/Bowel: Stomach is nondistended. There is no bowel wall thickening, inflammation, or distension. Vascular/Lymphatic: Normal appearance of the abdominal aorta. The upper abdominal vascularity appears normal. Reproductive: Uterus and bilateral adnexa are unremarkable. Other: No fluid identified within the abdomen or pelvis. No pneumoperitoneum. Musculoskeletal: Postoperative changes identified within the ventral abdominal wall. No acute or suspicious osseous findings. Degenerative changes are noted at the pubic symphysis. IMPRESSION: 1. Status post interval cholecystectomy. There is a small collection of intermediate attenuation material within the gallbladder fossa which is a nonspecific finding and  may represent postoperative seroma, hematoma and or Surgicel. If there is a clinical concern for bile leak consider nuclear medicine hepatobiliary scan. 2. Mild intrahepatic bile duct dilatation with common bile duct measuring up to 9 mm. No CT visible stones identified within the common bile duct. Electronically Signed   By: Kerby Moors M.D.   On: 03/26/2021 11:26   DG Chest Portable 1 View  Result Date: 03/26/2021 CLINICAL DATA:  51 year old female with right upper quadrant pain status post cholecystectomy last month. EXAM: PORTABLE CHEST 1 VIEW COMPARISON:  Chest CTA 07/13/2020 and earlier. FINDINGS: Portable AP upright view at 1051 hours. Low lung volumes, stable from last year. Normal cardiac size and mediastinal contours. Visualized tracheal air column is within normal limits. No pneumothorax. Allowing for portable technique the lungs are clear. Paucity of bowel gas in the upper abdomen. No acute osseous abnormality identified. IMPRESSION: Negative portable chest. Electronically Signed   By: Genevie Ann M.D.   On: 03/26/2021 10:59    Procedures Procedures    Medications Ordered in ED Medications  piperacillin-tazobactam (ZOSYN) IVPB 3.375 g (3.375 g Intravenous New Bag/Given 03/26/21 1135)  piperacillin-tazobactam (ZOSYN) IVPB 3.375 g (has no administration in time range)  HYDROmorphone (DILAUDID) injection 1 mg (has no administration in time range)  ondansetron (ZOFRAN) injection 4 mg (4 mg Intravenous Given 03/26/21 0945)  morphine (PF) 4 MG/ML injection 4 mg (4 mg Intravenous Given 03/26/21 0945)  sodium chloride 0.9 % bolus 1,000 mL (1,000 mLs Intravenous New Bag/Given 03/26/21 0954)  HYDROmorphone (DILAUDID) injection 1 mg (1 mg Intravenous Given 03/26/21 1009)  morphine (PF) 4 MG/ML injection 4 mg (4 mg Intravenous Given 03/26/21 1044)  diazepam (VALIUM) injection 2.5 mg (2.5 mg Intravenous Given 03/26/21 1048)  iohexol (OMNIPAQUE) 300 MG/ML solution 100 mL (100 mLs Intravenous Contrast Given 03/26/21  1113)    ED Course/ Medical Decision Making/ A&P                           Medical Decision Making Amount and/or Complexity of Data Reviewed Labs: ordered. Radiology: ordered.  Risk Prescription drug management. Decision regarding hospitalization.   KAMERAN LALLIER is a 51 year old female with history of reflux, status post cholecystectomy last week who presents with abdominal pain.  She has a JP drain in place.  Patient arrives with normal vitals.  No fever.  Woke up this morning with worsening abdominal pain at her surgical site.  JP drain with increased output of darker appearing fluid.  Previously was having very minimal serosanguineous fluid for the past week.  She is very uncomfortable on  exam.  She is tender on the right side of her abdomen but not particularly peritonitis.  No tenderness in the left side of the abdomen.  Denies any shortness of breath or chest pain.  Denies any constipation.  Differential diagnosis includes postop infectious process including abscess versus less likely bowel obstruction versus bowel perforation.  We will pursue CBC, CMP, lipase, urinalysis, CT scan abdomen and pelvis.  Will give IV fluids, IV morphine, IV Zofran and reevaluate.  Will likely touch base with general surgery once have imaging back.  She has been in contact with her surgeon as well.  We will hold on empiric antibiotics at this time given no fever or concern for sepsis at this time.  Patient reevaluated at 10:30 AM.  Thus far lab work per my interpretation shows no significant leukocytosis or anemia.  Remaining labs are pending.  She continues to have a lot of discomfort despite a dose of IV Dilaudid and IV morphine.  Still with some nausea feeling as well.  We will try some IV Valium to make her more comfortable as she is not able to sit still or lie down in bed.  Hopefully this will work to help get her comfortable enough to get a CT scan of her abdomen and pelvis.  My suspicion that there  is some postoperative process possibly even a biliary leak.  We will go ahead and get blood cultures and give a dose IV Zosyn while awaiting remaining work-up.  EKG was done per my interpretation shows sinus rhythm.  No ischemic changes.  Will obtain chest x-ray as well to look for pneumonia or other infectious process.  Lab work has been reviewed and interpreted.  Patient has mild lactic acidosis of 2.3.  As previously stated no significant leukocytosis.  Liver enzymes continue to be elevated from prior with an AST of 228, ALT of 149, alk phos 166.  Bilirubin is normal.  CT scan of the abdomen and pelvis per radiology's read shows small collection of fluid around the gallbladder which could be a seroma, hematoma, biliary leak.  There is mild intrahepatic duct dilation measuring up to 9 mm.  There are no visible stones.  Lipase is normal.  Overall suspicion for biliary leak.  General surgery, Dr. Donne Hazel has already evaluated the patient.  I will reach back out to the general surgery team the CT scan is done for further recommendations.  Surgery to admit, they are consulting gastroenterology for further recommendations as well.  Hemodynamically stable throughout my care.  This chart was dictated using voice recognition software.  Despite best efforts to proofread,  errors can occur which can change the documentation meaning.         Final Clinical Impression(s) / ED Diagnoses Final diagnoses:  Acute post-operative pain  Elevated liver enzymes  Abdominal pain, unspecified abdominal location    Rx / DC Orders ED Discharge Orders     None         Lennice Sites, DO 03/26/21 1154

## 2021-03-26 NOTE — Interval H&P Note (Signed)
History and Physical Interval Note:  03/26/2021 3:08 PM  Jamie Mckenzie  has presented today for surgery, with the diagnosis of elevated LFTs, AB pain, suspected bile leak 9 days s/p choley.  The various methods of treatment have been discussed with the patient and family. After consideration of risks, benefits and other options for treatment, the patient has consented to  Procedure(s): ENDOSCOPIC RETROGRADE CHOLANGIOPANCREATOGRAPHY (ERCP) WITH PROPOFOL (N/A) as a surgical intervention.  The patient's history has been reviewed, patient examined, no change in status, stable for surgery.  I have reviewed the patient's chart and labs.  Questions were answered to the patient's satisfaction.    The risks of an ERCP were discussed at length, including but not limited to the risk of perforation, bleeding, abdominal pain, post-ERCP pancreatitis (while usually mild can be severe and even life threatening).    [employer]

## 2021-03-26 NOTE — Op Note (Signed)
[facility] Patient Name: Jamie Mckenzie Procedure Date : 03/26/2021 MRN: [id number] Attending MD: Justice Britain , MD Date of Birth: [birth year] CSN: [id number] Age: 51 Admit Type: Outpatient Procedure:                ERCP Indications:              Bile leak, Biliary dilation on Computed Tomogram                            Scan, Postop exam: Cholecystectomy Providers:                Justice Britain, MD, Jeanella Cara, RN,                            Benetta Spar, Technician Referring MD:             Mila Homer. Verita Schneiders "[name]" [name] Medicines:                General Anesthesia, Indomethacin 100 mg PR,                            Glucagon 0.75 mg IV, Scheduled antibiotics were not                            due so none given Complications:            No immediate complications. Estimated Blood Loss:     Estimated blood loss was minimal. Procedure:                Pre-Anesthesia Assessment:                           - Prior to the procedure, a History and Physical                            was performed, and patient medications and                            allergies were reviewed. The patient's tolerance of                            previous anesthesia was also reviewed. The risks                            and benefits of the procedure and the sedation                            options and risks were discussed with the patient.                            All questions were answered, and informed consent                            was obtained. Prior Anticoagulants: The patient has  taken no previous anticoagulant or antiplatelet                            agents. ASA Grade Assessment: III - A patient with                            severe systemic disease. After reviewing the risks                            and benefits, the patient was deemed in                            satisfactory condition to undergo the  procedure.                           After obtaining informed consent, the scope was                            passed under direct vision. Throughout the                            procedure, the patient's blood pressure, pulse, and                            oxygen saturations were monitored continuously. The                            [id number]-Q190V ([id number]) Olympus duodenoscope was                            introduced through the mouth, and used to inject                            contrast into and used to inject contrast into the                            bile duct. The ERCP was accomplished without                            difficulty. The patient tolerated the procedure. Scope In: Scope Out: Findings:      A scout film of the abdomen was obtained. Surgical clips, consistent       with previous cholecystectomy, were seen in the area of the right upper       quadrant of the abdomen. One percutaneous drain ending in the Right       upper quadrant was seen.      The esophagus was successfully intubated under direct vision without       detailed examination of the pharynx, larynx, and associated structures.       A large amount of food (residue) was found in the entire examined       stomach. The major papilla was prominent.      A short 0.035 inch Soft Jagwire was passed into the biliary tree.       Initial preference was for the  wire to go to the cystic duct region. I       left this wire in place. Then I used another short 0.035 inch Soft       Jagwire and this was able to be passed into the main bile duct. I       removed the wire in the cystic duct. The Hydratome sphincterotome was       then passed over the guidewire and the bile duct was then deeply       cannulated. Contrast was injected. I personally interpreted the bile       duct images. Ductal flow of contrast was adequate. Image quality was       adequate. Contrast extended to the bifurcation. Opacification of the        entire biliary tree except for the gallbladder was successful. The       maximum diameter of the ducts was 10 mm. The lower third of the main       bile duct contained a filling defect thought to be a stone and sludge.       An 8 mm biliary sphincterotomy was made with a monofilament Hydratome       sphincterotome using ERBE electrocautery. There was no       post-sphincterotomy bleeding. To discover objects, the biliary tree was       swept with a retrieval balloon. Sludge was swept from the duct. One       stone was removed. No stones remained. An occlusion cholangiogram was       performed that showed a very slight extravasation of contrast       originating from the cystic duct region, but otherwise no other stones       were noted and the biliary duct was clear. One 10 Fr by 5 cm plastic       biliary stent with a single external flap and a single internal flap was       placed into the common bile duct. Bile flowed through the stent. The       stent was in good position.      A pancreatogram was not performed.      The duodenoscope was withdrawn from the patient. Impression:               - A large amount of food (residue) in the stomach.                           - The major papilla appeared to be prominent.                           - A filling defect consistent with a stone and                            sludge was seen on the cholangiogram.                           - A mild bile leak was found.                           - Choledocholithiasis was found. Complete removal  was accomplished by biliary sphincterotomy and                            balloon sweep.                           - One plastic biliary stent was placed into the                            common bile duct to aid in healing the biliary leak. Recommendation:           - The patient will be observed post-procedure,                            until all discharge criteria are met.                            - Return patient to hospital ward for ongoing care.                           - Advance diet as tolerated.                           - Observe patient's clinical course.                           - Check liver enzymes (AST, ALT, alkaline                            phosphatase, bilirubin) in the morning.                           - Watch for pancreatitis, bleeding, perforation,                            and cholangitis.                           - Repeat ERCP in 2 months to check healing.                           - The findings and recommendations were discussed                            with the patient.                           - The findings and recommendations were discussed                            with the designated responsible adult.                           - The findings and recommendations were discussed  with the referring physician. Procedure Code(s):        --- Professional ---                           [phone], Endoscopic retrograde                            cholangiopancreatography (ERCP); with placement of                            endoscopic stent into biliary or pancreatic duct,                            including pre- and post-dilation and guide wire                            passage, when performed, including sphincterotomy,                            when performed, each stent                           [id number], Endoscopic retrograde                            cholangiopancreatography (ERCP); with removal of                            calculi/debris from biliary/pancreatic duct(s) Diagnosis Code(s):        --- Professional ---                           K83.8, Other specified diseases of biliary tract                           K83.9, Disease of biliary tract, unspecified                           K80.50, Calculus of bile duct without cholangitis                            or cholecystitis without obstruction                            Z09, Encounter for follow-up examination after                            completed treatment for conditions other than                            malignant neoplasm                           Z90.49, Acquired absence of other specified parts                            of digestive tract  R93.2, Abnormal findings on diagnostic imaging of                            liver and biliary tract CPT copyright 2019 [employer]. All rights reserved. The codes documented in this report are preliminary and upon coder review may  be revised to meet current compliance requirements. Justice Britain, MD 03/26/2021 4:57:47 PM Number of Addenda: 0

## 2021-03-26 NOTE — H&P (View-Only) (Signed)
Consultation  Referring Provider:   Dr. Donne Hazel Primary Care Physician:  Jinny Sanders, MD Primary Gastroenterologist:  Althia Forts       Reason for Consultation:     Bile leak s/p choley  Assessment: Bile leak 9 days s/p post lap choley with Dr. Donne Hazel on 03/17/2021 due to gangrenous cholecystitis patient still with JP drain Patient did have intraoperative cholangiogram LIPASE 26 WBC 10.3 HGB 14.7 MCV 92.2 Platelets 360 AST 228 ALT 149 Alkphos 166 TBili 0.8 INR no result  Hypoxia Patient not on home oxygen, currently on 2 L, improves with patient sitting up. Patient states she had some desaturations after surgery, questionable sleep apnea untreated. Question pain related.  Chest x-ray unremarkable  Plan: -Continue pain control -NPO today for possible ERCP later  if available if not able to proceed with ERCP today than can do clears today, NPO at midnight for ERCP tomorrow.  -current perc drain in place  -VTE hold for possible ERCP today -ABX-zosyn  - pending blood cultures  I thoroughly discussed procedure with the patient to include nature, alternatives, benefits, and risks (including but not limited to post ERCP pancreatitis, bleeding, infection, perforation, anesthesia/cardiac pulmonary complications).  Patient verbalized understanding and gave verbal consent to proceed with ERCP. Will plan for stent removal at the appropriate time in 6-8 weeks from now .  Thank you for your kind consultation, we will continue to follow.         HPI:   Jamie Mckenzie is a 51 y.o. female with past medical history significant for hypothyroidism, hypertension, reflux. Past surgical history includes hysteroscopy with D&C, multiple tooth extractions, ORIF ankle fracture 1995, 2013 and most recently cholecystectomy 03/17/2021.  Patient was undergoing work-up with Dr. Donne Hazel for right upper quadrant abdominal pain after eating when patient began to have worsening abdominal  pain was seen at droppage ER, had elevated transaminase, taken to surgery 03/17/2021 for lap chole. During procedure found to have gangrenous gallbladder, still has drain in place, patient was doing postoperatively until 830 this morning when she began to have acute abdominal pain with some nausea.  Denies fever, emesis. JP drain with bilious drainage. CT abdomen pelvis with contrast in the ER showed small collection of material within the gallbladder fossa, likely postoperative seroma. Mild intrahepatic bile duct dilatation with common bile duct measuring up to 9 mm, no visible stones within common bile duct seen. Dr. Donne Hazel concern for postoperative bile leak. 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  Patient states she is doing very well postsurgery.  JP drain with 30 cc or less for the last 2 to 3 days.  Yesterday morning she woke up feeling poorly, denies fever or chills just felt bad with nausea.  Then had progressive right upper quadrant abdominal pain with radiation to right shoulder blade and back.  Also around that time had increase of dark bilious material from JP drain. Last bowel movement day before last, has been every 2 to 3 days since surgery. Last had a drink at 6:30 AM this morning, no food today no liquids since that time. Patient not normally on oxygen but currently on 2 L nasal cannula due to desaturations.  States she did have desaturations postsurgery, has never been tested for sleep apnea but does think it is a possibility.  Also currently is leaning over the bed while sitting on the chair with some compression of the lungs which could be contributing.  Saturating at 98 to 99% on 2 L.  ED course: Pending blood culture, lactic acid initially 2.3 currently 1.6, lipase negative, patient has had elevation of AST, ALT from 8 days ago, alkaline phosphatase stable.  No elevation of total bili.  White blood cell count 10.3, hemoglobin 14.7.   Past Medical History:   Diagnosis Date   Abrasion of right wrist 04/15/2012   Arthritis    ankles   Cough 04/15/2012   Dental crowns present    GERD (gastroesophageal reflux disease)    Hypertension    Hypothyroidism    Perimenopausal 03/26/2012   irregular periods   Retained orthopedic hardware 03/26/2012   left ankle   Stuffy and runny nose    clear drainage from nose - URI onset 03/30/2012    Surgical History:  She  has a past surgical history that includes ORIF ankle fracture (Right, 1985); Hysteroscopy with D & C (10/24/2009); ORIF ankle fracture (11/03/2011); Multiple tooth extractions; Dilation and evacuation (11/15/2008); Hardware Removal (Left, 04/21/2012); Breast biopsy (Left, 10/23/2014); and Cholecystectomy (N/A, 03/17/2021). Family History:  Her family history includes Arthritis in her mother; Breast cancer in her maternal aunt; COPD in her paternal grandfather; Cancer in her father and maternal grandmother; Heart disease in her maternal grandfather; Hypertension in her mother; Myasthenia gravis in her mother; Obesity in her mother. Social History:   reports that she has been smoking cigarettes. She has never used smokeless tobacco. She reports current alcohol use. She reports that she does not use drugs.  Prior to Admission medications   Medication Sig Start Date End Date Taking? Authorizing Provider  acetaminophen (TYLENOL) 500 MG tablet Take 1,000 mg by mouth every 6 (six) hours as needed for mild pain.   Yes [provider]  Ascorbic Acid (VITAMIN C) 1000 MG tablet Take 1,000 mg by mouth daily.   Yes [provider]  docusate sodium (COLACE) 100 MG capsule Take 100 mg by mouth daily as needed for mild constipation.   Yes [provider]  Elderberry 500 MG CAPS Take 500 mg by mouth daily.   Yes [provider]  ibuprofen (ADVIL) 200 MG tablet Take 600-800 mg by mouth every 6 (six) hours as needed for headache or mild pain.   Yes [provider]   levothyroxine (SYNTHROID) 25 MCG tablet Take 1 tablet (25 mcg total) by mouth daily. 08/15/20  Yes Bedsole, Amy E, MD  losartan-hydrochlorothiazide (HYZAAR) 50-12.5 MG tablet TAKE 1 TABLET BY MOUTH DAILY. 04/30/20 04/30/21 Yes Bedsole, Amy E, MD  Multiple Vitamins-Minerals (MULTI FOR HER PO) Take 1 tablet by mouth daily.   Yes [provider]  omeprazole (PRILOSEC) 20 MG capsule Take 1 capsule (20 mg total) by mouth daily. 05/21/16  Yes Bedsole, Amy E, MD  triamcinolone cream (KENALOG) 0.1 % Apply topically 2 (two) times daily as needed (rash). 12/13/19  Yes [provider]  oxyCODONE (OXY IR/ROXICODONE) 5 MG immediate release tablet Take 1 tablet (5 mg total) by mouth every 4 (four) hours as needed for moderate pain or severe pain (5mg  for moderate pain, 10mg  for severe pain). Patient not taking: Reported on 03/26/2021 03/18/21   Rolm Bookbinder, MD    Current Facility-Administered Medications  Medication Dose Route Frequency Provider Last Rate Last Admin   0.9 %  sodium chloride infusion   Intravenous Once Curatolo, Adam, DO       HYDROmorphone (DILAUDID) injection 1 mg  1 mg Intravenous Q2H PRN Curatolo, Adam, DO   1 mg at  03/26/21 1215   piperacillin-tazobactam (ZOSYN) IVPB 3.375 g  3.375 g Intravenous Q8H von Dohlen, Stasia Cavalier, RPH       Current Outpatient Medications  Medication Sig Dispense Refill   acetaminophen (TYLENOL) 500 MG tablet Take 1,000 mg by mouth every 6 (six) hours as needed for mild pain.     Ascorbic Acid (VITAMIN C) 1000 MG tablet Take 1,000 mg by mouth daily.     docusate sodium (COLACE) 100 MG capsule Take 100 mg by mouth daily as needed for mild constipation.     Elderberry 500 MG CAPS Take 500 mg by mouth daily.     ibuprofen (ADVIL) 200 MG tablet Take 600-800 mg by mouth every 6 (six) hours as needed for headache or mild pain.     levothyroxine (SYNTHROID) 25 MCG tablet Take 1 tablet (25 mcg total) by mouth daily. 90 tablet 1    losartan-hydrochlorothiazide (HYZAAR) 50-12.5 MG tablet TAKE 1 TABLET BY MOUTH DAILY. 90 tablet 1   Multiple Vitamins-Minerals (MULTI FOR HER PO) Take 1 tablet by mouth daily.     omeprazole (PRILOSEC) 20 MG capsule Take 1 capsule (20 mg total) by mouth daily. 90 capsule 1   triamcinolone cream (KENALOG) 0.1 % Apply topically 2 (two) times daily as needed (rash).     oxyCODONE (OXY IR/ROXICODONE) 5 MG immediate release tablet Take 1 tablet (5 mg total) by mouth every 4 (four) hours as needed for moderate pain or severe pain (5mg  for moderate pain, 10mg  for severe pain). (Patient not taking: Reported on 03/26/2021) 10 tablet 0    Allergies as of 03/26/2021   (No Known Allergies)    Review of Systems:    Review of Systems  Constitutional:  Negative for chills, fever and malaise/fatigue.  Respiratory:  Negative for cough, sputum production and shortness of breath.   Cardiovascular:  Negative for chest pain.  Gastrointestinal:  Positive for abdominal pain, constipation and nausea. Negative for blood in stool, diarrhea, heartburn, melena and vomiting.  Musculoskeletal:  Negative for falls.  Skin:  Negative for itching.  Neurological:  Negative for dizziness.  Psychiatric/Behavioral:  Negative for memory loss. The patient is not nervous/anxious.       Physical Exam:  Vital signs in last 24 hours: Temp:  [98 F (36.7 C)] 98 F (36.7 C) (02/01 0943) Pulse Rate:  [79-99] 96 (02/01 1249) Resp:  [19-29] 25 (02/01 1249) BP: (122-167)/(57-92) 167/92 (02/01 1249) SpO2:  [92 %-99 %] 99 % (02/01 1249)    General:   Pleasant, obese female in pain Head:  Normocephalic and atraumatic. Eyes: sclerae anicteric,conjunctive injected  Heart:  regular rate and rhythm, distant heart sounds Pulm: Clear anteriorly; no wheezing,  [place] in place Abdomen:  Soft, Obese AB, skin exam right JP drain in place, site without erythema or purulent drainage, dark bilious material, slightly erythematous but well-healing  Lap choley scars Normal bowel sounds. marked tenderness in the RUQ. With guarding and Without rebound, Extremities:  Without edema. Msk:  Symmetrical without gross deformities. Peripheral pulses intact.  Neurologic:  Alert and  oriented x4;  grossly normal neurologically. Skin:   Dry and intact without significant lesions or rashes. Psychiatric: Demonstrates good judgement and reason without abnormal affect or behaviors.  LAB RESULTS: Recent Labs    03/26/21 0940  WBC 10.3  HGB 14.7  HCT 45.1  PLT 360   BMET Recent Labs    03/26/21 0940  NA 138  K 3.6  CL 102  CO2 28  GLUCOSE  116*  BUN 20  CREATININE 0.91  CALCIUM 9.6   LFT Recent Labs    03/26/21 0940  PROT 7.3  ALBUMIN 3.7  AST 228*  ALT 149*  ALKPHOS 166*  BILITOT 0.8   PT/INR No results for input(s): LABPROT, INR in the last 72 hours.  STUDIES: CT ABDOMEN PELVIS W CONTRAST  Result Date: 03/26/2021 CLINICAL DATA:  Status post cholecystectomy. Worsening pain with progressive drainage from JP drain. EXAM: CT ABDOMEN AND PELVIS WITH CONTRAST TECHNIQUE: Multidetector CT imaging of the abdomen and pelvis was performed using the standard protocol following bolus administration of intravenous contrast. RADIATION DOSE REDUCTION: This exam was performed according to the departmental dose-optimization program which includes automated exposure control, adjustment of the mA and/or kV according to patient size and/or use of iterative reconstruction technique. CONTRAST:  170mL OMNIPAQUE IOHEXOL 300 MG/ML  SOLN COMPARISON:  02/05/2020 FINDINGS: Lower chest: No acute abnormality. Hepatobiliary: There is no suspicious liver lesion identified. Previous cholecystectomy. Within the gallbladder fossa there is a small collection of intermediate attenuation material measuring 5.0 x 2.8 x 2.4 cm (volume = 18 cm^3), image 35/3. In the early postoperative time frame this is a nonspecific finding and may represent postoperative seroma, hematoma  and/or Surgicel. A surgical drain is identified which enters from a right lateral abdominal approach. The tip terminates along the inferior medial margin of the right lobe of liver. The common bile duct measures up to 9 mm, image 103/7. There is mild intrahepatic bile duct dilatation. No CT visible stones identified within the common bile duct. Pancreas: Unremarkable. No pancreatic ductal dilatation or surrounding inflammatory changes. Spleen: Normal in size without focal abnormality. Adrenals/Urinary Tract: Normal adrenal glands. Upper pole of right kidney cyst measures 4.0 x 4.9 cm. No hydronephrosis or mass identified bilaterally. Urinary bladder is unremarkable. Stomach/Bowel: Stomach is nondistended. There is no bowel wall thickening, inflammation, or distension. Vascular/Lymphatic: Normal appearance of the abdominal aorta. The upper abdominal vascularity appears normal. Reproductive: Uterus and bilateral adnexa are unremarkable. Other: No fluid identified within the abdomen or pelvis. No pneumoperitoneum. Musculoskeletal: Postoperative changes identified within the ventral abdominal wall. No acute or suspicious osseous findings. Degenerative changes are noted at the pubic symphysis. IMPRESSION: 1. Status post interval cholecystectomy. There is a small collection of intermediate attenuation material within the gallbladder fossa which is a nonspecific finding and may represent postoperative seroma, hematoma and or Surgicel. If there is a clinical concern for bile leak consider nuclear medicine hepatobiliary scan. 2. Mild intrahepatic bile duct dilatation with common bile duct measuring up to 9 mm. No CT visible stones identified within the common bile duct. Electronically Signed   By: Kerby Moors M.D.   On: 03/26/2021 11:26   DG Chest Portable 1 View  Result Date: 03/26/2021 CLINICAL DATA:  51 year old female with right upper quadrant pain status post cholecystectomy last month. EXAM: PORTABLE CHEST 1 VIEW  COMPARISON:  Chest CTA 07/13/2020 and earlier. FINDINGS: Portable AP upright view at 1051 hours. Low lung volumes, stable from last year. Normal cardiac size and mediastinal contours. Visualized tracheal air column is within normal limits. No pneumothorax. Allowing for portable technique the lungs are clear. Paucity of bowel gas in the upper abdomen. No acute osseous abnormality identified. IMPRESSION: Negative portable chest. Electronically Signed   By: Genevie Ann M.D.   On: 03/26/2021 10:59     Vladimir Crofts  03/26/2021, 1:01 PM

## 2021-03-26 NOTE — ED Notes (Signed)
Jamie Mckenzie is here and requesting call when done with procedure if patient is not returning to ED.

## 2021-03-26 NOTE — ED Notes (Signed)
Patient currently unable to tolerate BP cuff due to pain.

## 2021-03-26 NOTE — ED Notes (Signed)
Patient transported to endo with endo staff.

## 2021-03-26 NOTE — Consult Note (Signed)
Consultation  Referring Provider:   Dr. Donne Hazel Primary Care Physician:  Jinny Sanders, MD Primary Gastroenterologist:  Althia Forts       Reason for Consultation:     Bile leak s/p choley  Assessment: Bile leak 9 days s/p post lap choley with Dr. Donne Hazel on 03/17/2021 due to gangrenous cholecystitis patient still with JP drain Patient did have intraoperative cholangiogram LIPASE 26 WBC 10.3 HGB 14.7 MCV 92.2 Platelets 360 AST 228 ALT 149 Alkphos 166 TBili 0.8 INR no result  Hypoxia Patient not on home oxygen, currently on 2 L, improves with patient sitting up. Patient states she had some desaturations after surgery, questionable sleep apnea untreated. Question pain related.  Chest x-ray unremarkable  Plan: -Continue pain control -NPO today for possible ERCP later  if available if not able to proceed with ERCP today than can do clears today, NPO at midnight for ERCP tomorrow.  -current perc drain in place  -VTE hold for possible ERCP today -[id number]-zosyn  - pending blood cultures  I thoroughly discussed procedure with the patient to include nature, alternatives, benefits, and risks (including but not limited to post ERCP pancreatitis, bleeding, infection, perforation, anesthesia/cardiac pulmonary complications).  Patient verbalized understanding and gave verbal consent to proceed with ERCP. Will plan for stent removal at the appropriate time in 6-8 weeks from now .  Thank you for your kind consultation, we will continue to follow.         HPI:   Jamie Mckenzie is a 51 y.o. female with past medical history significant for hypothyroidism, hypertension, reflux. Past surgical history includes hysteroscopy with D&C, multiple tooth extractions, ORIF ankle fracture 1995, 2013 and most recently cholecystectomy 03/17/2021.  Patient was undergoing work-up with Dr. Donne Hazel for right upper quadrant abdominal pain after eating when patient began to have worsening abdominal  pain was seen at droppage ER, had elevated transaminase, taken to surgery 03/17/2021 for lap chole. During procedure found to have gangrenous gallbladder, still has drain in place, patient was doing postoperatively until 830 this morning when she began to have acute abdominal pain with some nausea.  Denies fever, emesis. JP drain with bilious drainage. CT abdomen pelvis with contrast in the ER showed small collection of material within the gallbladder fossa, likely postoperative seroma. Mild intrahepatic bile duct dilatation with common bile duct measuring up to 9 mm, no visible stones within common bile duct seen. Dr. Donne Hazel concern for postoperative bile leak. 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  Patient states she is doing very well postsurgery.  JP drain with 30 cc or less for the last 2 to 3 days.  Yesterday morning she woke up feeling poorly, denies fever or chills just felt bad with nausea.  Then had progressive right upper quadrant abdominal pain with radiation to right shoulder blade and back.  Also around that time had increase of dark bilious material from JP drain. Last bowel movement day before last, has been every 2 to 3 days since surgery. Last had a drink at 6:30 AM this morning, no food today no liquids since that time. Patient not normally on oxygen but currently on 2 L nasal cannula due to desaturations.  States she did have desaturations postsurgery, has never been tested for sleep apnea but does think it is a possibility.  Also currently is leaning over the bed while sitting on the chair with some compression of the lungs which could be contributing.  Saturating at 98 to 99% on 2 L.  ED course: Pending blood culture, lactic acid initially 2.3 currently 1.6, lipase negative, patient has had elevation of AST, ALT from 8 days ago, alkaline phosphatase stable.  No elevation of total bili.  White blood cell count 10.3, hemoglobin 14.7.   Past Medical History:   Diagnosis Date   Abrasion of right wrist 04/15/2012   Arthritis    ankles   Cough 04/15/2012   Dental crowns present    GERD (gastroesophageal reflux disease)    Hypertension    Hypothyroidism    Perimenopausal 03/26/2012   irregular periods   Retained orthopedic hardware 03/26/2012   left ankle   Stuffy and runny nose    clear drainage from nose - URI onset 03/30/2012    Surgical History:  She  has a past surgical history that includes ORIF ankle fracture (Right, 1985); Hysteroscopy with D & C (10/24/2009); ORIF ankle fracture (11/03/2011); Multiple tooth extractions; Dilation and evacuation (11/15/2008); Hardware Removal (Left, 04/21/2012); Breast biopsy (Left, 10/23/2014); and Cholecystectomy (N/A, 03/17/2021). Family History:  Her family history includes Arthritis in her mother; Breast cancer in her maternal aunt; COPD in her paternal grandfather; Cancer in her father and maternal grandmother; Heart disease in her maternal grandfather; Hypertension in her mother; Myasthenia gravis in her mother; Obesity in her mother. Social History:   reports that she has been smoking cigarettes. She has never used smokeless tobacco. She reports current alcohol use. She reports that she does not use drugs.  Prior to Admission medications   Medication Sig Start Date End Date Taking? Authorizing Provider  acetaminophen (TYLENOL) 500 MG tablet Take 1,000 mg by mouth every 6 (six) hours as needed for mild pain.   Yes [provider]  Ascorbic Acid (VITAMIN C) 1000 MG tablet Take 1,000 mg by mouth daily.   Yes [provider]  docusate sodium (COLACE) 100 MG capsule Take 100 mg by mouth daily as needed for mild constipation.   Yes [provider]  Elderberry 500 MG CAPS Take 500 mg by mouth daily.   Yes [provider]  ibuprofen (ADVIL) 200 MG tablet Take 600-800 mg by mouth every 6 (six) hours as needed for headache or mild pain.   Yes [provider]   levothyroxine (SYNTHROID) 25 MCG tablet Take 1 tablet (25 mcg total) by mouth daily. 08/15/20  Yes Bedsole, Amy E, MD  losartan-hydrochlorothiazide (HYZAAR) 50-12.5 MG tablet TAKE 1 TABLET BY MOUTH DAILY. 04/30/20 04/30/21 Yes Bedsole, Amy E, MD  Multiple Vitamins-Minerals (MULTI FOR HER PO) Take 1 tablet by mouth daily.   Yes [provider]  omeprazole (PRILOSEC) 20 MG capsule Take 1 capsule (20 mg total) by mouth daily. 05/21/16  Yes Bedsole, Amy E, MD  triamcinolone cream (KENALOG) 0.1 % Apply topically 2 (two) times daily as needed (rash). 12/13/19  Yes [provider]  oxyCODONE (OXY IR/ROXICODONE) 5 MG immediate release tablet Take 1 tablet (5 mg total) by mouth every 4 (four) hours as needed for moderate pain or severe pain (5mg  for moderate pain, 10mg  for severe pain). Patient not taking: Reported on 03/26/2021 03/18/21   Rolm Bookbinder, MD    Current Facility-Administered Medications  Medication Dose Route Frequency Provider Last Rate Last Admin   0.9 %  sodium chloride infusion   Intravenous Once Curatolo, Adam, DO       HYDROmorphone (DILAUDID) injection 1 mg  1 mg Intravenous Q2H PRN Curatolo, Adam, DO   1 mg at  03/26/21 1215   piperacillin-tazobactam (ZOSYN) IVPB 3.375 g  3.375 g Intravenous Q8H von Dohlen, Stasia Cavalier, RPH       Current Outpatient Medications  Medication Sig Dispense Refill   acetaminophen (TYLENOL) 500 MG tablet Take 1,000 mg by mouth every 6 (six) hours as needed for mild pain.     Ascorbic Acid (VITAMIN C) 1000 MG tablet Take 1,000 mg by mouth daily.     docusate sodium (COLACE) 100 MG capsule Take 100 mg by mouth daily as needed for mild constipation.     Elderberry 500 MG CAPS Take 500 mg by mouth daily.     ibuprofen (ADVIL) 200 MG tablet Take 600-800 mg by mouth every 6 (six) hours as needed for headache or mild pain.     levothyroxine (SYNTHROID) 25 MCG tablet Take 1 tablet (25 mcg total) by mouth daily. 90 tablet 1    losartan-hydrochlorothiazide (HYZAAR) 50-12.5 MG tablet TAKE 1 TABLET BY MOUTH DAILY. 90 tablet 1   Multiple Vitamins-Minerals (MULTI FOR HER PO) Take 1 tablet by mouth daily.     omeprazole (PRILOSEC) 20 MG capsule Take 1 capsule (20 mg total) by mouth daily. 90 capsule 1   triamcinolone cream (KENALOG) 0.1 % Apply topically 2 (two) times daily as needed (rash).     oxyCODONE (OXY IR/ROXICODONE) 5 MG immediate release tablet Take 1 tablet (5 mg total) by mouth every 4 (four) hours as needed for moderate pain or severe pain (5mg  for moderate pain, 10mg  for severe pain). (Patient not taking: Reported on 03/26/2021) 10 tablet 0    Allergies as of 03/26/2021   (No Known Allergies)    Review of Systems:    Review of Systems  Constitutional:  Negative for chills, fever and malaise/fatigue.  Respiratory:  Negative for cough, sputum production and shortness of breath.   Cardiovascular:  Negative for chest pain.  Gastrointestinal:  Positive for abdominal pain, constipation and nausea. Negative for blood in stool, diarrhea, heartburn, melena and vomiting.  Musculoskeletal:  Negative for falls.  Skin:  Negative for itching.  Neurological:  Negative for dizziness.  Psychiatric/Behavioral:  Negative for memory loss. The patient is not nervous/anxious.       Physical Exam:  Vital signs in last 24 hours: Temp:  [98 F (36.7 C)] 98 F (36.7 C) (02/01 0943) Pulse Rate:  [79-99] 96 (02/01 1249) Resp:  [19-29] 25 (02/01 1249) BP: (122-167)/(57-92) 167/92 (02/01 1249) SpO2:  [92 %-99 %] 99 % (02/01 1249)    General:   Pleasant, obese female in pain Head:  Normocephalic and atraumatic. Eyes: sclerae anicteric,conjunctive injected  Heart:  regular rate and rhythm, distant heart sounds Pulm: Clear anteriorly; no wheezing,  Brutus in place Abdomen:  Soft, Obese AB, skin exam right JP drain in place, site without erythema or purulent drainage, dark bilious material, slightly erythematous but well-healing  Lap choley scars Normal bowel sounds. marked tenderness in the RUQ. With guarding and Without rebound, Extremities:  Without edema. Msk:  Symmetrical without gross deformities. Peripheral pulses intact.  Neurologic:  Alert and  oriented x4;  grossly normal neurologically. Skin:   Dry and intact without significant lesions or rashes. Psychiatric: Demonstrates good judgement and reason without abnormal affect or behaviors.  LAB RESULTS: Recent Labs    03/26/21 0940  WBC 10.3  HGB 14.7  HCT 45.1  PLT 360   BMET Recent Labs    03/26/21 0940  NA 138  K 3.6  CL 102  CO2 28  GLUCOSE  116*  BUN 20  CREATININE 0.91  CALCIUM 9.6   LFT Recent Labs    03/26/21 0940  PROT 7.3  ALBUMIN 3.7  AST 228*  ALT 149*  ALKPHOS 166*  BILITOT 0.8   PT/INR No results for input(s): LABPROT, INR in the last 72 hours.  STUDIES: CT ABDOMEN PELVIS W CONTRAST  Result Date: 03/26/2021 CLINICAL DATA:  Status post cholecystectomy. Worsening pain with progressive drainage from JP drain. EXAM: CT ABDOMEN AND PELVIS WITH CONTRAST TECHNIQUE: Multidetector CT imaging of the abdomen and pelvis was performed using the standard protocol following bolus administration of intravenous contrast. RADIATION DOSE REDUCTION: This exam was performed according to the departmental dose-optimization program which includes automated exposure control, adjustment of the mA and/or kV according to patient size and/or use of iterative reconstruction technique. CONTRAST:  142mL OMNIPAQUE IOHEXOL 300 MG/ML  SOLN COMPARISON:  02/05/2020 FINDINGS: Lower chest: No acute abnormality. Hepatobiliary: There is no suspicious liver lesion identified. Previous cholecystectomy. Within the gallbladder fossa there is a small collection of intermediate attenuation material measuring 5.0 x 2.8 x 2.4 cm (volume = 18 cm^3), image 35/3. In the early postoperative time frame this is a nonspecific finding and may represent postoperative seroma, hematoma  and/or Surgicel. A surgical drain is identified which enters from a right lateral abdominal approach. The tip terminates along the inferior medial margin of the right lobe of liver. The common bile duct measures up to 9 mm, image 103/7. There is mild intrahepatic bile duct dilatation. No CT visible stones identified within the common bile duct. Pancreas: Unremarkable. No pancreatic ductal dilatation or surrounding inflammatory changes. Spleen: Normal in size without focal abnormality. Adrenals/Urinary Tract: Normal adrenal glands. Upper pole of right kidney cyst measures 4.0 x 4.9 cm. No hydronephrosis or mass identified bilaterally. Urinary bladder is unremarkable. Stomach/Bowel: Stomach is nondistended. There is no bowel wall thickening, inflammation, or distension. Vascular/Lymphatic: Normal appearance of the abdominal aorta. The upper abdominal vascularity appears normal. Reproductive: Uterus and bilateral adnexa are unremarkable. Other: No fluid identified within the abdomen or pelvis. No pneumoperitoneum. Musculoskeletal: Postoperative changes identified within the ventral abdominal wall. No acute or suspicious osseous findings. Degenerative changes are noted at the pubic symphysis. IMPRESSION: 1. Status post interval cholecystectomy. There is a small collection of intermediate attenuation material within the gallbladder fossa which is a nonspecific finding and may represent postoperative seroma, hematoma and or Surgicel. If there is a clinical concern for bile leak consider nuclear medicine hepatobiliary scan. 2. Mild intrahepatic bile duct dilatation with common bile duct measuring up to 9 mm. No CT visible stones identified within the common bile duct. Electronically Signed   By: Kerby Moors M.D.   On: 03/26/2021 11:26   DG Chest Portable 1 View  Result Date: 03/26/2021 CLINICAL DATA:  51 year old female with right upper quadrant pain status post cholecystectomy last month. EXAM: PORTABLE CHEST 1 VIEW  COMPARISON:  Chest CTA 07/13/2020 and earlier. FINDINGS: Portable AP upright view at 1051 hours. Low lung volumes, stable from last year. Normal cardiac size and mediastinal contours. Visualized tracheal air column is within normal limits. No pneumothorax. Allowing for portable technique the lungs are clear. Paucity of bowel gas in the upper abdomen. No acute osseous abnormality identified. IMPRESSION: Negative portable chest. Electronically Signed   By: Genevie Ann M.D.   On: 03/26/2021 10:59     Vladimir Crofts  03/26/2021, 1:01 PM

## 2021-03-27 ENCOUNTER — Telehealth: Payer: Self-pay

## 2021-03-27 ENCOUNTER — Other Ambulatory Visit: Payer: 59

## 2021-03-27 DIAGNOSIS — Z20822 Contact with and (suspected) exposure to covid-19: Secondary | ICD-10-CM | POA: Diagnosis not present

## 2021-03-27 DIAGNOSIS — E039 Hypothyroidism, unspecified: Secondary | ICD-10-CM | POA: Diagnosis not present

## 2021-03-27 DIAGNOSIS — K8050 Calculus of bile duct without cholangitis or cholecystitis without obstruction: Secondary | ICD-10-CM | POA: Diagnosis not present

## 2021-03-27 DIAGNOSIS — F17210 Nicotine dependence, cigarettes, uncomplicated: Secondary | ICD-10-CM | POA: Diagnosis not present

## 2021-03-27 DIAGNOSIS — Z9049 Acquired absence of other specified parts of digestive tract: Secondary | ICD-10-CM | POA: Diagnosis not present

## 2021-03-27 DIAGNOSIS — R932 Abnormal findings on diagnostic imaging of liver and biliary tract: Secondary | ICD-10-CM | POA: Diagnosis not present

## 2021-03-27 DIAGNOSIS — K838 Other specified diseases of biliary tract: Secondary | ICD-10-CM | POA: Diagnosis not present

## 2021-03-27 DIAGNOSIS — I10 Essential (primary) hypertension: Secondary | ICD-10-CM | POA: Diagnosis not present

## 2021-03-27 LAB — CBC
HCT: 41.3 % (ref 36.0–46.0)
Hemoglobin: 12.8 g/dL (ref 12.0–15.0)
MCH: 29 pg (ref 26.0–34.0)
MCHC: 31 g/dL (ref 30.0–36.0)
MCV: 93.7 fL (ref 80.0–100.0)
Platelets: 347 10*3/uL (ref 150–400)
RBC: 4.41 MIL/uL (ref 3.87–5.11)
RDW: 13.5 % (ref 11.5–15.5)
WBC: 16.8 10*3/uL — ABNORMAL HIGH (ref 4.0–10.5)
nRBC: 0 % (ref 0.0–0.2)

## 2021-03-27 LAB — COMPREHENSIVE METABOLIC PANEL
ALT: 114 U/L — ABNORMAL HIGH (ref 0–44)
AST: 58 U/L — ABNORMAL HIGH (ref 15–41)
Albumin: 3.4 g/dL — ABNORMAL LOW (ref 3.5–5.0)
Alkaline Phosphatase: 132 U/L — ABNORMAL HIGH (ref 38–126)
Anion gap: 10 (ref 5–15)
BUN: 17 mg/dL (ref 6–20)
CO2: 25 mmol/L (ref 22–32)
Calcium: 8.8 mg/dL — ABNORMAL LOW (ref 8.9–10.3)
Chloride: 100 mmol/L (ref 98–111)
Creatinine, Ser: 0.82 mg/dL (ref 0.44–1.00)
GFR, Estimated: >60 mL/min (ref >60)
Glucose, Bld: 93 mg/dL (ref 70–99)
Potassium: 4.4 mmol/L (ref 3.5–5.1)
Sodium: 135 mmol/L (ref 135–145)
Total Bilirubin: 0.6 mg/dL (ref 0.3–1.2)
Total Protein: 6.6 g/dL (ref 6.5–8.1)

## 2021-03-27 NOTE — Progress Notes (Signed)
Discharge instructions given to pt. Pt verbalized understanding of all teaching ?

## 2021-03-27 NOTE — Discharge Summary (Signed)
[facility] Discharge Summary   Patient ID: Jamie Mckenzie MRN: [id number] DOB/AGE: June 01, 1970 51 y.o.  Admit date: 03/26/2021 Discharge date: 03/27/2021  Admitting Diagnosis: Abdominal pain s/p lap chole Bile leak  Discharge Diagnosis Bile leak Choledocholithiasis  Consultants Gastroenterology  Imaging: CT ABDOMEN PELVIS W CONTRAST  Result Date: 03/26/2021 CLINICAL DATA:  Status post cholecystectomy. Worsening pain with progressive drainage from JP drain. EXAM: CT ABDOMEN AND PELVIS WITH CONTRAST TECHNIQUE: Multidetector CT imaging of the abdomen and pelvis was performed using the standard protocol following bolus administration of intravenous contrast. RADIATION DOSE REDUCTION: This exam was performed according to the departmental dose-optimization program which includes automated exposure control, adjustment of the mA and/or kV according to patient size and/or use of iterative reconstruction technique. CONTRAST:  178mL OMNIPAQUE IOHEXOL 300 MG/ML  SOLN COMPARISON:  02/05/2020 FINDINGS: Lower chest: No acute abnormality. Hepatobiliary: There is no suspicious liver lesion identified. Previous cholecystectomy. Within the gallbladder fossa there is a small collection of intermediate attenuation material measuring 5.0 x 2.8 x 2.4 cm (volume = 18 cm^3), image 35/3. In the early postoperative time frame this is a nonspecific finding and may represent postoperative seroma, hematoma and/or Surgicel. A surgical drain is identified which enters from a right lateral abdominal approach. The tip terminates along the inferior medial margin of the right lobe of liver. The common bile duct measures up to 9 mm, image 103/7. There is mild intrahepatic bile duct dilatation. No CT visible stones identified within the common bile duct. Pancreas: Unremarkable. No pancreatic ductal dilatation or surrounding inflammatory changes. Spleen: Normal in size without focal abnormality. Adrenals/Urinary  Tract: Normal adrenal glands. Upper pole of right kidney cyst measures 4.0 x 4.9 cm. No hydronephrosis or mass identified bilaterally. Urinary bladder is unremarkable. Stomach/Bowel: Stomach is nondistended. There is no bowel wall thickening, inflammation, or distension. Vascular/Lymphatic: Normal appearance of the abdominal aorta. The upper abdominal vascularity appears normal. Reproductive: Uterus and bilateral adnexa are unremarkable. Other: No fluid identified within the abdomen or pelvis. No pneumoperitoneum. Musculoskeletal: Postoperative changes identified within the ventral abdominal wall. No acute or suspicious osseous findings. Degenerative changes are noted at the pubic symphysis. IMPRESSION: 1. Status post interval cholecystectomy. There is a small collection of intermediate attenuation material within the gallbladder fossa which is a nonspecific finding and may represent postoperative seroma, hematoma and or Surgicel. If there is a clinical concern for bile leak consider nuclear medicine hepatobiliary scan. 2. Mild intrahepatic bile duct dilatation with common bile duct measuring up to 9 mm. No CT visible stones identified within the common bile duct. Electronically Signed   By: Kerby Moors M.D.   On: 03/26/2021 11:26   DG Chest Portable 1 View  Result Date: 03/26/2021 CLINICAL DATA:  51 year old female with right upper quadrant pain status post cholecystectomy last month. EXAM: PORTABLE CHEST 1 VIEW COMPARISON:  Chest CTA 07/13/2020 and earlier. FINDINGS: Portable AP upright view at 1051 hours. Low lung volumes, stable from last year. Normal cardiac size and mediastinal contours. Visualized tracheal air column is within normal limits. No pneumothorax. Allowing for portable technique the lungs are clear. Paucity of bowel gas in the upper abdomen. No acute osseous abnormality identified. IMPRESSION: Negative portable chest. Electronically Signed   By: Genevie Ann M.D.   On: 03/26/2021 10:59   DG ERCP  WITH SPHINCTEROTOMY  Result Date: 03/26/2021 CLINICAL DATA:  Post cholecystectomy. EXAM: ERCP COMPARISON:  CT AP, 03/26/2021 and 02/05/2020. US Abdomen, 03/17/2021. FLUOROSCOPY: Exposure Index (as provided by the fluoroscopic  device): 77.3 mGy Kerma FINDINGS: Multiple, limited oblique planar images of the RIGHT upper quadrant obtained C-arm. Images demonstrating flexible endoscopy, biliary duct cannulation, sphincterotomy, retrograde cholangiogram and balloon sweep. A plastic biliary stent is imaged at the end of the case. No biliary ductal dilation. No evidence of biliary filling defect is demonstrated. IMPRESSION: Fluoroscopic imaging for ERCP, as above. No biliary ductal dilation or discrete filling defect is demonstrated. For complete description of intra procedural findings, please see performing service dictation. Electronically Signed   By: Michaelle Birks M.D.   On: 03/26/2021 17:06    Procedures Dr. Rush Landmark (03/26/2021) - ERCP  Hospital Course:  Jamie Mckenzie is a 51yo female who underwent laparoscopic cholecystectomy for gangrenous cholecystitis on 03/17/21 by Dr. Donne Hazel, who presented to [facility] 2/1 with acute onset abdominal pain. Her surgical drain appeared serosanguinous but changed character that morning and appeared more bilious. Pain not controlled at home so she came to the ED. CT scan and elevated LFTs concerning for bile leak. Gastroenterology was consulted and took the patient for ERCP where a mild bile leak was found so one plastic biliary stent was placed into the common bile duct; choledocholithiasis was also found and complete removal was accomplished by biliary sphincterotomy and balloon sweep. Tolerated procedure well. Patient's symptoms resolved the following day. On 03/27/21 the patient was voiding well, tolerating diet, ambulating well, pain well controlled, vital signs stable and felt stable for discharge home.  Patient will follow up as below and knows to call with questions or  concerns.    Physical Exam: General:  Alert, NAD, pleasant, comfortable Pulm: rate and effort normal Abd:  Soft, ND, NT, lap incisions cdi with faint erythema but no fluctuance or drainage, drain with small amount of golden bilious fluid  Allergies as of 03/27/2021   No Known Allergies      Medication List     TAKE these medications    acetaminophen 500 MG tablet Commonly known as: TYLENOL Take 1,000 mg by mouth every 6 (six) hours as needed for mild pain.   docusate sodium 100 MG capsule Commonly known as: COLACE Take 100 mg by mouth daily as needed for mild constipation.   Elderberry 500 MG Caps Take 500 mg by mouth daily.   ibuprofen 200 MG tablet Commonly known as: ADVIL Take 600-800 mg by mouth every 6 (six) hours as needed for headache or mild pain.   levothyroxine 25 MCG tablet Commonly known as: SYNTHROID Take 1 tablet (25 mcg total) by mouth daily.   losartan-hydrochlorothiazide 50-12.5 MG tablet Commonly known as: HYZAAR TAKE 1 TABLET BY MOUTH DAILY.   MULTI FOR HER PO Take 1 tablet by mouth daily.   omeprazole 20 MG capsule Commonly known as: PRILOSEC Take 1 capsule (20 mg total) by mouth daily.   oxyCODONE 5 MG immediate release tablet Commonly known as: Oxy IR/ROXICODONE Take 1 tablet (5 mg total) by mouth every 4 (four) hours as needed for moderate pain or severe pain (5mg  for moderate pain, 10mg  for severe pain).   triamcinolone cream 0.1 % Commonly known as: KENALOG Apply topically 2 (two) times daily as needed (rash).   vitamin C 1000 MG tablet Take 1,000 mg by mouth daily.          Follow-up Information     Rolm Bookbinder, MD. Call.   Specialty: General Surgery Why: Our office will contact you to reschedule your follow up appointment Contact information: [address] [place] [address] [phone]  Mansouraty, Telford Nab., MD Follow up.   Specialties: Gastroenterology, Internal Medicine Contact  information: [place] [address] [phone]                \  Signed: Wellington Hampshire, [facility] 03/27/2021, 8:49 AM Please see Amion for pager number during day hours 7:00am-4:30pm

## 2021-03-27 NOTE — Telephone Encounter (Signed)
-----   Message from [name]., MD sent at 03/26/2021  5:05 PM EST ----- Regarding: Follow-up ERCP Jamie Mckenzie, This patient needs a follow-up ERCP in 2 months for biliary leak and stent removal. Thanks. GM

## 2021-03-27 NOTE — Telephone Encounter (Signed)
Staff message sent to myself to contact pt when the April schedule is out.

## 2021-03-31 ENCOUNTER — Encounter (HOSPITAL_COMMUNITY): Payer: Self-pay | Admitting: Gastroenterology

## 2021-03-31 LAB — CULTURE, BLOOD (ROUTINE X 2)
Culture: NO GROWTH
Culture: NO GROWTH
Special Requests: ADEQUATE
Special Requests: ADEQUATE

## 2021-04-24 ENCOUNTER — Telehealth: Payer: Self-pay

## 2021-04-24 ENCOUNTER — Other Ambulatory Visit: Payer: Self-pay

## 2021-04-24 DIAGNOSIS — K839 Disease of biliary tract, unspecified: Secondary | ICD-10-CM

## 2021-04-24 NOTE — Telephone Encounter (Signed)
ERCP scheduled for 06/16/21 at 1245 pm at [facility] with GM ?

## 2021-04-24 NOTE — Telephone Encounter (Signed)
-----   Message from Timothy Lasso, RN sent at 03/27/2021 12:26 PM EST ----- ?Message from [name]., MD sent at 03/26/2021  5:05 PM EST ----- ?Regarding: Follow-up ERCP ?Jamie Mckenzie, ?This patient needs a follow-up ERCP in 2 months for biliary leak and stent removal. ?Thanks. ?GM ?? ? ?

## 2021-04-25 NOTE — Telephone Encounter (Signed)
Left message on machine to call back  

## 2021-04-28 NOTE — Telephone Encounter (Signed)
Left message on machine to call back  

## 2021-04-29 NOTE — Telephone Encounter (Signed)
I have been unable to reach the pt by phone.  Several messages have been left.  All information was mailed and sent to My Chart.  I did confirm that she read the My Chart message.  Will await a call or response from the pt ?

## 2021-05-21 ENCOUNTER — Telehealth: Payer: Self-pay | Admitting: Gastroenterology

## 2021-05-21 NOTE — Telephone Encounter (Signed)
The pt has been rescheduled to 07/16/21 at [facility] with GM ?New instructions sent to the pt in My Chart and mail. Left message on machine to call back  ?

## 2021-05-21 NOTE — Telephone Encounter (Signed)
Dr Rush Landmark this pt is calling to reschedule her ERCP from 4/24.  Your next available is 5/24. Is this ok? Is it too far out? ?

## 2021-05-21 NOTE — Telephone Encounter (Signed)
That is OK. ?Thanks. ?GM ?

## 2021-05-21 NOTE — Telephone Encounter (Signed)
Inbound call from patient stating that she needs to reschedule her procedure at [facility] on 4/24. Requesting a call back to discuss. Please advise.  ?

## 2021-05-22 NOTE — Telephone Encounter (Signed)
The patient has been notified of this information and all questions answered.

## 2021-06-05 ENCOUNTER — Telehealth: Payer: Self-pay | Admitting: Family Medicine

## 2021-06-05 DIAGNOSIS — E039 Hypothyroidism, unspecified: Secondary | ICD-10-CM

## 2021-06-05 DIAGNOSIS — I10 Essential (primary) hypertension: Secondary | ICD-10-CM

## 2021-06-05 NOTE — Telephone Encounter (Signed)
lab

## 2021-06-11 ENCOUNTER — Other Ambulatory Visit (HOSPITAL_COMMUNITY): Payer: Self-pay

## 2021-06-11 ENCOUNTER — Other Ambulatory Visit: Payer: Self-pay | Admitting: Family Medicine

## 2021-06-11 MED ORDER — LOSARTAN POTASSIUM-HCTZ 50-12.5 MG PO TABS
1.0000 | ORAL_TABLET | Freq: Every day | ORAL | 0 refills | Status: DC
  Filled 2021-06-11: qty 30, 30d supply, fill #0

## 2021-06-20 ENCOUNTER — Other Ambulatory Visit: Payer: 59

## 2021-06-27 ENCOUNTER — Encounter: Payer: 59 | Admitting: Family Medicine

## 2021-07-09 ENCOUNTER — Encounter (HOSPITAL_COMMUNITY): Payer: Self-pay | Admitting: Gastroenterology

## 2021-07-09 NOTE — Progress Notes (Signed)
Attempted to obtain medical history via telephone, unable to reach at this time. HIPAA compliant voicemail message left requesting return call to pre surgical testing department. 

## 2021-07-16 ENCOUNTER — Ambulatory Visit (HOSPITAL_COMMUNITY): Payer: 59 | Admitting: Anesthesiology

## 2021-07-16 ENCOUNTER — Telehealth: Payer: Self-pay

## 2021-07-16 ENCOUNTER — Encounter (HOSPITAL_COMMUNITY)
Admission: RE | Disposition: A | Discharge status: Home / Self Care | Payer: Self-pay | Source: Home / Self Care | Attending: Gastroenterology

## 2021-07-16 ENCOUNTER — Ambulatory Visit (HOSPITAL_COMMUNITY): Payer: 59

## 2021-07-16 ENCOUNTER — Ambulatory Visit (HOSPITAL_BASED_OUTPATIENT_CLINIC_OR_DEPARTMENT_OTHER): Payer: 59 | Admitting: Anesthesiology

## 2021-07-16 ENCOUNTER — Ambulatory Visit (HOSPITAL_COMMUNITY)
Admission: RE | Admit: 2021-07-16 | Discharge: 2021-07-16 | Disposition: A | Discharge status: Home / Self Care | Payer: 59 | Attending: Gastroenterology | Admitting: Gastroenterology

## 2021-07-16 ENCOUNTER — Other Ambulatory Visit: Payer: Self-pay

## 2021-07-16 ENCOUNTER — Encounter (HOSPITAL_COMMUNITY): Payer: Self-pay | Admitting: Gastroenterology

## 2021-07-16 DIAGNOSIS — K2950 Unspecified chronic gastritis without bleeding: Secondary | ICD-10-CM | POA: Insufficient documentation

## 2021-07-16 DIAGNOSIS — K3189 Other diseases of stomach and duodenum: Secondary | ICD-10-CM

## 2021-07-16 DIAGNOSIS — Z9049 Acquired absence of other specified parts of digestive tract: Secondary | ICD-10-CM | POA: Diagnosis not present

## 2021-07-16 DIAGNOSIS — Z4659 Encounter for fitting and adjustment of other gastrointestinal appliance and device: Secondary | ICD-10-CM | POA: Insufficient documentation

## 2021-07-16 DIAGNOSIS — I10 Essential (primary) hypertension: Secondary | ICD-10-CM | POA: Insufficient documentation

## 2021-07-16 DIAGNOSIS — M1990 Unspecified osteoarthritis, unspecified site: Secondary | ICD-10-CM | POA: Diagnosis not present

## 2021-07-16 DIAGNOSIS — K839 Disease of biliary tract, unspecified: Secondary | ICD-10-CM

## 2021-07-16 DIAGNOSIS — F17210 Nicotine dependence, cigarettes, uncomplicated: Secondary | ICD-10-CM | POA: Insufficient documentation

## 2021-07-16 DIAGNOSIS — E039 Hypothyroidism, unspecified: Secondary | ICD-10-CM | POA: Insufficient documentation

## 2021-07-16 DIAGNOSIS — K838 Other specified diseases of biliary tract: Secondary | ICD-10-CM | POA: Insufficient documentation

## 2021-07-16 DIAGNOSIS — K449 Diaphragmatic hernia without obstruction or gangrene: Secondary | ICD-10-CM | POA: Diagnosis not present

## 2021-07-16 DIAGNOSIS — K819 Cholecystitis, unspecified: Secondary | ICD-10-CM

## 2021-07-16 DIAGNOSIS — K219 Gastro-esophageal reflux disease without esophagitis: Secondary | ICD-10-CM | POA: Insufficient documentation

## 2021-07-16 HISTORY — PX: BIOPSY: SHX5522

## 2021-07-16 HISTORY — PX: ENDOSCOPIC RETROGRADE CHOLANGIOPANCREATOGRAPHY (ERCP) WITH PROPOFOL: SHX5810

## 2021-07-16 HISTORY — PX: REMOVAL OF STONES: SHX5545

## 2021-07-16 HISTORY — PX: BILIARY STENT PLACEMENT: SHX5538

## 2021-07-16 SURGERY — ENDOSCOPIC RETROGRADE CHOLANGIOPANCREATOGRAPHY (ERCP) WITH PROPOFOL
Anesthesia: General

## 2021-07-16 MED ORDER — SODIUM CHLORIDE 0.9 % IV SOLN: INTRAVENOUS | Status: DC

## 2021-07-16 MED ORDER — DEXAMETHASONE SODIUM PHOSPHATE 10 MG/ML IJ SOLN
INTRAMUSCULAR | Status: DC | PRN
  Administered 2021-07-16: 10 mg via INTRAVENOUS

## 2021-07-16 MED ORDER — LACTATED RINGERS IV SOLN: INTRAVENOUS | Status: DC

## 2021-07-16 MED ORDER — GLUCAGON HCL RDNA (DIAGNOSTIC) 1 MG IJ SOLR
INTRAMUSCULAR | Status: AC
  Filled 2021-07-16: qty 1

## 2021-07-16 MED ORDER — LIDOCAINE 2% (20 MG/ML) 5 ML SYRINGE
INTRAMUSCULAR | Status: DC | PRN
  Administered 2021-07-16: 100 mg via INTRAVENOUS

## 2021-07-16 MED ORDER — ONDANSETRON HCL 4 MG/2ML IJ SOLN
INTRAMUSCULAR | Status: DC | PRN
  Administered 2021-07-16: 4 mg via INTRAVENOUS

## 2021-07-16 MED ORDER — CIPROFLOXACIN IN D5W 400 MG/200ML IV SOLN
INTRAVENOUS | Status: AC
  Filled 2021-07-16: qty 200

## 2021-07-16 MED ORDER — MIDAZOLAM HCL 2 MG/2ML IJ SOLN
INTRAMUSCULAR | Status: AC
  Filled 2021-07-16: qty 2

## 2021-07-16 MED ORDER — FENTANYL CITRATE (PF) 100 MCG/2ML IJ SOLN
INTRAMUSCULAR | Status: AC
  Filled 2021-07-16: qty 2

## 2021-07-16 MED ORDER — ROCURONIUM BROMIDE 10 MG/ML (PF) SYRINGE
PREFILLED_SYRINGE | INTRAVENOUS | Status: DC | PRN
  Administered 2021-07-16: 100 mg via INTRAVENOUS

## 2021-07-16 MED ORDER — DICLOFENAC SUPPOSITORY 100 MG
RECTAL | Status: DC | PRN
  Administered 2021-07-16: 100 mg via RECTAL

## 2021-07-16 MED ORDER — DICLOFENAC SUPPOSITORY 100 MG
RECTAL | Status: AC
  Filled 2021-07-16: qty 1

## 2021-07-16 MED ORDER — PROPOFOL 10 MG/ML IV BOLUS
INTRAVENOUS | Status: DC | PRN
  Administered 2021-07-16: 200 mg via INTRAVENOUS

## 2021-07-16 MED ORDER — CIPROFLOXACIN IN D5W 400 MG/200ML IV SOLN
INTRAVENOUS | Status: DC | PRN
  Administered 2021-07-16: 400 mg via INTRAVENOUS

## 2021-07-16 MED ORDER — FENTANYL CITRATE (PF) 100 MCG/2ML IJ SOLN
INTRAMUSCULAR | Status: DC | PRN
Start: 2021-07-16 — End: 2021-07-16
  Administered 2021-07-16 (×2): 50 ug via INTRAVENOUS

## 2021-07-16 MED ORDER — SODIUM CHLORIDE 0.9 % IV SOLN
INTRAVENOUS | Status: DC | PRN
  Administered 2021-07-16: 30 mL

## 2021-07-16 MED ORDER — PHENYLEPHRINE 80 MCG/ML (10ML) SYRINGE FOR IV PUSH (FOR BLOOD PRESSURE SUPPORT)
PREFILLED_SYRINGE | INTRAVENOUS | Status: DC | PRN
  Administered 2021-07-16: 160 ug via INTRAVENOUS

## 2021-07-16 MED ORDER — MIDAZOLAM HCL 2 MG/2ML IJ SOLN
INTRAMUSCULAR | Status: DC | PRN
  Administered 2021-07-16: 2 mg via INTRAVENOUS

## 2021-07-16 MED ORDER — PROPOFOL 500 MG/50ML IV EMUL
INTRAVENOUS | Status: AC
  Filled 2021-07-16: qty 100

## 2021-07-16 MED ORDER — CIPROFLOXACIN IN D5W 400 MG/200ML IV SOLN
INTRAVENOUS | Status: AC
Start: 2021-07-16 — End: ?
  Filled 2021-07-16: qty 200

## 2021-07-16 MED ORDER — PROPOFOL 500 MG/50ML IV EMUL
INTRAVENOUS | Status: AC
  Filled 2021-07-16: qty 200

## 2021-07-16 MED ORDER — SUGAMMADEX SODIUM 500 MG/5ML IV SOLN
INTRAVENOUS | Status: DC | PRN
  Administered 2021-07-16: 200 mg via INTRAVENOUS
  Administered 2021-07-16: 300 mg via INTRAVENOUS

## 2021-07-16 NOTE — H&P (Signed)
GASTROENTEROLOGY PROCEDURE H&P NOTE   Primary Care Physician: Jinny Sanders, MD  HPI: Jamie Mckenzie is a 51 y.o. female who presents for ERCP for evaluation of previous choledocholithiasis and biliary leak and for stent removal and cleanout.  Past Medical History:  Diagnosis Date   Abrasion of right wrist 04/15/2012   Arthritis    ankles   Cough 04/15/2012   Dental crowns present    GERD (gastroesophageal reflux disease)    Hypertension    Hypothyroidism    Perimenopausal 03/26/2012   irregular periods   Retained orthopedic hardware 03/26/2012   left ankle   Stuffy and runny nose    clear drainage from nose - URI onset 03/30/2012   Past Surgical History:  Procedure Laterality Date   BILIARY STENT PLACEMENT  03/26/2021   Procedure: BILIARY STENT PLACEMENT;  Surgeon: [name]., MD;  Location: [place];  Service: Gastroenterology;;   BREAST BIOPSY Left 10/23/2014   Benign   CHOLECYSTECTOMY N/A 03/17/2021   Procedure: LAPAROSCOPIC CHOLECYSTECTOMY WITH ICG;  Surgeon: Rolm Bookbinder, MD;  Location: [place];  Service: General;  Laterality: N/A;   DILATION AND EVACUATION  11/15/2008   ENDOSCOPIC RETROGRADE CHOLANGIOPANCREATOGRAPHY (ERCP) WITH PROPOFOL N/A 03/26/2021   Procedure: ENDOSCOPIC RETROGRADE CHOLANGIOPANCREATOGRAPHY (ERCP) WITH PROPOFOL;  Surgeon: [name]., MD;  Location: [place];  Service: Gastroenterology;  Laterality: N/A;   HARDWARE REMOVAL Left 04/21/2012   Procedure: HARDWARE REMOVAL;  Surgeon: Wylene Simmer, MD;  Location: [place];  Service: Orthopedics;  Laterality: Left;  LEFT ANKLE HARDWARE REMOVAL   HYSTEROSCOPY WITH D & C  10/24/2009   with polypectomy   MULTIPLE TOOTH EXTRACTIONS     ORIF ANKLE FRACTURE Right 1985   ORIF ANKLE FRACTURE  11/03/2011   Procedure: OPEN REDUCTION INTERNAL FIXATION (ORIF) ANKLE FRACTURE;  Surgeon: Wylene Simmer, MD;  Location: [place];  Service: Orthopedics;   Laterality: Left;  Open Reduction Internal Fixation left ankle lateral malleolus fracture, left syndesmosis   REMOVAL OF STONES  03/26/2021   Procedure: REMOVAL OF STONES;  Surgeon: Rush Landmark Telford Nab., MD;  Location: [place];  Service: Gastroenterology;;   Joan Mayans  03/26/2021   Procedure: Joan Mayans;  Surgeon: Mansouraty, Telford Nab., MD;  Location: [place];  Service: Gastroenterology;;   Current Facility-Administered Medications  Medication Dose Route Frequency Provider Last Rate Last Admin   0.9 %  sodium chloride infusion   Intravenous Continuous Mansouraty, Telford Nab., MD       lactated ringers infusion   Intravenous Continuous Mansouraty, Telford Nab., MD 10 mL/hr at 07/16/21 0740 New Bag at 07/16/21 0740    Current Facility-Administered Medications:    0.9 %  sodium chloride infusion, , Intravenous, Continuous, Mansouraty, Telford Nab., MD   lactated ringers infusion, , Intravenous, Continuous, Mansouraty, Telford Nab., MD, Last Rate: 10 mL/hr at 07/16/21 0740, New Bag at 07/16/21 0740 No Known Allergies Family History  Problem Relation Age of Onset   Hypertension Mother    Arthritis Mother    Obesity Mother        morbid   Myasthenia gravis Mother    Cancer Father        non hodgkins lymphoma   Cancer Maternal Grandmother        lung   Heart disease Maternal Grandfather        heart attack   COPD Paternal Grandfather    Breast cancer Maternal Aunt    Social History   Socioeconomic History   Marital  status: Single    Spouse name: Not on file   Number of children: Not on file   Years of education: Not on file   Highest education level: Not on file  Occupational History   Occupation: Registered Nurse    Employer: [employer]  Tobacco Use   Smoking status: Light Smoker    Years: 10.00    Types: Cigarettes   Smokeless tobacco: Never   Tobacco comments:    2-3 cig./day  Vaping Use   Vaping Use: Never used  Substance and Sexual  Activity   Alcohol use: Yes    Comment: socially   Drug use: No   Sexual activity: Not on file  Other Topics Concern   Not on file  Social History Narrative   Not on file   Social Determinants of Health   Financial Resource Strain: Not on file  Food Insecurity: Not on file  Transportation Needs: Not on file  Physical Activity: Not on file  Stress: Not on file  Social Connections: Not on file  Intimate Partner Violence: Not on file    Physical Exam: Today's Vitals   07/16/21 0726  BP: 137/62  Pulse: 92  Resp: 13  Temp: 97.9 F (36.6 C)  TempSrc: Oral  SpO2: 95%  Weight: 136.1 kg  Height: '5\' 5"'$  (1.651 m)  PainSc: 0-No pain   Body mass index is 49.92 kg/m. GEN: NAD EYE: Sclerae anicteric ENT: MMM CV: Non-tachycardic GI: Soft, NT/ND NEURO:  Alert & Oriented x 3  Lab Results: No results for input(s): WBC, HGB, HCT, PLT in the last 72 hours. BMET No results for input(s): NA, K, CL, CO2, GLUCOSE, BUN, CREATININE, CALCIUM in the last 72 hours. LFT No results for input(s): PROT, ALBUMIN, AST, ALT, ALKPHOS, BILITOT, BILIDIR, IBILI in the last 72 hours. PT/INR No results for input(s): LABPROT, INR in the last 72 hours.   Impression / Plan: This is a 51 y.o.female who presents for ERCP for evaluation of previous choledocholithiasis and biliary leak and for stent removal and cleanout.  The risks of an ERCP were discussed at length, including but not limited to the risk of perforation, bleeding, abdominal pain, post-ERCP pancreatitis (while usually mild can be severe and even life threatening).   The risks and benefits of endoscopic evaluation/treatment were discussed with the patient and/or family; these include but are not limited to the risk of perforation, infection, bleeding, missed lesions, lack of diagnosis, severe illness requiring hospitalization, as well as anesthesia and sedation related illnesses.  The patient's history has been reviewed, patient examined, no  change in status, and deemed stable for procedure.  The patient and/or family is agreeable to proceed.    Justice Britain, MD [facility] Office # [phone]

## 2021-07-16 NOTE — Anesthesia Postprocedure Evaluation (Signed)
Anesthesia Post Note  Patient: Jamie Mckenzie  Procedure(s) Performed: ENDOSCOPIC RETROGRADE CHOLANGIOPANCREATOGRAPHY (ERCP) WITH PROPOFOL REMOVAL OF SLUDGE BILIARY STENT REMOVAL BIOPSY     Patient location during evaluation: Endoscopy Anesthesia Type: General Level of consciousness: awake and alert Pain management: pain level controlled Vital Signs Assessment: post-procedure vital signs reviewed and stable Respiratory status: spontaneous breathing, nonlabored ventilation and respiratory function stable Cardiovascular status: blood pressure returned to baseline and stable Postop Assessment: no apparent nausea or vomiting Anesthetic complications: no   No notable events documented.  Last Vitals:  Vitals:   07/16/21 1010 07/16/21 1020  BP: 133/63 133/73  Pulse: 86 84  Resp: 16 18  Temp:    SpO2: 97% 99%    Last Pain:  Vitals:   07/16/21 1020  TempSrc:   PainSc: 0-No pain                 Lidia Collum

## 2021-07-16 NOTE — Telephone Encounter (Signed)
The lab order has been entered to be completed in 2-4 weeks.  Follow up made for 09/16/21 at 1:50 pm with GM  No appts available for colon at the hospital.  Aug schedule is not out as of today.  Pt can discuss at her upcoming appt with GM.     Left message on machine to call back

## 2021-07-16 NOTE — Telephone Encounter (Signed)
-----   Message from [name]., MD sent at 07/16/2021 10:03 AM EDT ----- Regarding: Follow-up MW, Removed biliary stent.  No further leak.  She has had some increased bowel movements but that seems to be improving so hopefully will not need cholestyramine.  Should be good to go from the gallbladder/bile duct area moving forward. Thanks.  Jamie Mckenzie, Patient needs a follow up hepatic function panel in 2 to 4 weeks in our office. Patient also is due for colon cancer screening.  Due to her BMI, she will need to be done in the hospital-based setting.  Please set her up and schedule colonoscopy at her convenience, if we are already booked with our current dates, just put her on our list so that we can get her done this year. Set her up for a follow-up in clinic in 6 to 10 weeks at her convenience, I am okay with her not needing clinic follow-up if she is okay with just getting the colonoscopy scheduled. Thanks. GM  FYI AEB

## 2021-07-16 NOTE — Anesthesia Procedure Notes (Signed)
Procedure Name: Intubation Date/Time: 07/16/2021 8:57 AM Performed by: Genelle Bal, CRNA Pre-anesthesia Checklist: Patient identified, Emergency Drugs available, Suction available and Patient being monitored Patient Re-evaluated:Patient Re-evaluated prior to induction Oxygen Delivery Method: Circle system utilized Preoxygenation: Pre-oxygenation with 100% oxygen Induction Type: IV induction Ventilation: Mask ventilation without difficulty Laryngoscope Size: Glidescope and 3 Grade View: Grade I Tube type: Oral Tube size: 7.0 mm Number of attempts: 1 Airway Equipment and Method: Stylet and Video-laryngoscopy Placement Confirmation: ETT inserted through vocal cords under direct vision, positive ETCO2 and breath sounds checked- equal and bilateral Secured at: 22 cm Tube secured with: Tape Dental Injury: Teeth and Oropharynx as per pre-operative assessment  Difficulty Due To: Difficult Airway- due to limited oral opening and Difficult Airway- due to reduced neck mobility Comments: Elective glidescope intubation d/t body habitus, reduced neck ROM, small mouth. Prior intubations also utilized glidescope.

## 2021-07-16 NOTE — Anesthesia Preprocedure Evaluation (Addendum)
Anesthesia Evaluation  Patient identified by MRN, date of birth, ID band Patient awake    Reviewed: Allergy & Precautions, NPO status , Patient's Chart, lab work & pertinent test results  Airway Mallampati: III  TM Distance: >3 FB Neck ROM: Full    Dental  (+) Dental Advisory Given   Pulmonary Current Smoker and Patient abstained from smoking.,    Pulmonary exam normal        Cardiovascular hypertension, Pt. on medications Normal cardiovascular exam     Neuro/Psych  Headaches, PSYCHIATRIC DISORDERS Depression    GI/Hepatic GERD  Controlled and Medicated,S/p lap chole with bile leak earlier this year   Endo/Other  Hypothyroidism Morbid obesityBMI 50  Renal/GU negative Renal ROS  negative genitourinary   Musculoskeletal  (+) Arthritis , Osteoarthritis,    Abdominal (+) + obese,   Peds  Hematology negative hematology ROS (+)   Anesthesia Other Findings   Reproductive/Obstetrics negative OB ROS                           Anesthesia Physical  Anesthesia Plan  ASA: 3  Anesthesia Plan: General   Post-op Pain Management: Minimal or no pain anticipated   Induction: Intravenous  PONV Risk Score and Plan: 2 and Ondansetron, Dexamethasone and Midazolam  Airway Management Planned: Oral ETT  Additional Equipment: None  Intra-op Plan:   Post-operative Plan: Extubation in OR  Informed Consent: I have reviewed the patients History and Physical, chart, labs and discussed the procedure including the risks, benefits and alternatives for the proposed anesthesia with the patient or authorized representative who has indicated his/her understanding and acceptance.     Dental advisory given  Plan Discussed with: CRNA  Anesthesia Plan Comments: (Airway note from previous surgery: DL x1 with Mac 3, grade III view. Elected to use Glidescope. Grade 1 view obtained with successful intubation.   )         Anesthesia Quick Evaluation

## 2021-07-16 NOTE — Discharge Instructions (Signed)
YOU HAD AN ENDOSCOPIC PROCEDURE TODAY: Refer to the procedure report and other information in the discharge instructions given to you for any specific questions about what was found during the examination. If this information does not answer your questions, please call [place] office at [phone] to clarify.   YOU SHOULD EXPECT: Some feelings of bloating in the abdomen. Passage of more gas than usual. Walking can help get rid of the air that was put into your GI tract during the procedure and reduce the bloating. If you had a lower endoscopy (such as a colonoscopy or flexible sigmoidoscopy) you may notice spotting of blood in your stool or on the toilet paper. Some abdominal soreness may be present for a day or two, also.  DIET: Your first meal following the procedure should be a light meal and then it is ok to progress to your normal diet. A half-sandwich or bowl of soup is an example of a good first meal. Heavy or fried foods are harder to digest and may make you feel nauseous or bloated. Drink plenty of fluids but you should avoid alcoholic beverages for 24 hours. If you had a esophageal dilation, please see attached instructions for diet.    ACTIVITY: Your care partner should take you home directly after the procedure. You should plan to take it easy, moving slowly for the rest of the day. You can resume normal activity the day after the procedure however YOU SHOULD NOT DRIVE, use power tools, machinery or perform tasks that involve climbing or major physical exertion for 24 hours (because of the sedation medicines used during the test).   SYMPTOMS TO REPORT IMMEDIATELY: A gastroenterologist can be reached at any hour. Please call [phone]  for any of the following symptoms:   Following upper endoscopy (EGD, EUS, ERCP, esophageal dilation) Vomiting of blood or coffee ground material  New, significant abdominal pain  New, significant chest pain or pain under the shoulder blades  Painful or  persistently difficult swallowing  New shortness of breath  Black, tarry-looking or red, bloody stools  FOLLOW UP:  If any biopsies were taken you will be contacted by phone or by letter within the next 1-3 weeks. Call [phone]  if you have not heard about the biopsies in 3 weeks.  Please also call with any specific questions about appointments or follow up tests.  

## 2021-07-16 NOTE — Transfer of Care (Signed)
Immediate Anesthesia Transfer of Care Note  Patient: Jamie Mckenzie [facility]  Procedure(s) Performed: ENDOSCOPIC RETROGRADE CHOLANGIOPANCREATOGRAPHY (ERCP) WITH PROPOFOL REMOVAL OF SLUDGE BILIARY STENT REMOVAL BIOPSY  Patient Location: PACU  Anesthesia Type:General  Level of Consciousness: awake, alert  and oriented  Airway & Oxygen Therapy: Patient Spontanous Breathing and Patient connected to face mask oxygen  Post-op Assessment: Report given to RN, Post -op Vital signs reviewed and stable and Patient moving all extremities X 4  Post vital signs: Reviewed and stable  Last Vitals:  Vitals Value Taken Time  BP 135/46   Temp    Pulse 83   Resp 12   SpO2 99     Last Pain:  Vitals:   07/16/21 0726  TempSrc: Oral  PainSc: 0-No pain         Complications: No notable events documented.

## 2021-07-16 NOTE — Op Note (Signed)
[facility] Patient Name: Jamie Mckenzie Procedure Date: 07/16/2021 MRN: [id number] Attending MD: Justice Britain , MD Date of Birth: 11/01/1970 CSN: [id number] Age: 51 Admit Type: Outpatient Procedure:                ERCP Indications:              Bile leak, Biliary stent removal Providers:                Justice Britain, MD, Doristine Johns, RN,                            Frazier Richards, Technician Referring MD:             Mila Homer. Dione Booze MD, MD Medicines:                Monitored Anesthesia Care, General Anesthesia,                            Cipro 400 mg IV, Diclofenac 616 mg rectal Complications:            No immediate complications. Estimated Blood Loss:     Estimated blood loss was minimal. Procedure:                Pre-Anesthesia Assessment:                           - Prior to the procedure, a History and Physical                            was performed, and patient medications and                            allergies were reviewed. The patient's tolerance of                            previous anesthesia was also reviewed. The risks                            and benefits of the procedure and the sedation                            options and risks were discussed with the patient.                            All questions were answered, and informed consent                            was obtained. Prior Anticoagulants: The patient has                            taken no previous anticoagulant or antiplatelet                            agents. ASA Grade Assessment: III - A patient with  severe systemic disease. After reviewing the risks                            and benefits, the patient was deemed in                            satisfactory condition to undergo the procedure.                           After obtaining informed consent, the scope was                            passed under direct vision.  Throughout the                            procedure, the patient's blood pressure, pulse, and                            oxygen saturations were monitored continuously. The                            [employer] D single use                            duodenoscope was introduced through the mouth, and                            used to inject contrast into and used to inject                            contrast into the bile duct. The ERCP was                            accomplished without difficulty. Scope In: Scope Out: Findings:      A scout film of the abdomen was obtained. Surgical clips, consistent       with previous cholecystectomy, were seen in the area of the right upper       quadrant of the abdomen. One stent ending in the main bile duct was seen.      The upper GI tract was traversed under direct vision without detailed       examination. The Z-line was regular and was found 42 cm from the       incisors. A 2 cm hiatal hernia was present. Patchy mildly erythematous       mucosa without bleeding was found in the entire examined stomach -       biopsied to evaluate for H. pylori. No gross lesions were noted in the       duodenal bulb, in the first portion of the duodenum and in the second       portion of the duodenum. A biliary sphincterotomy had been performed at       the ampullary orifice. The sphincterotomy appeared open. One plastic       biliary stent originating in the biliary tree was emerging from the       major papilla. One  stent was removed from the biliary tree using a snare.      A short 0.035 inch Soft Jagwire was passed into the biliary tree. The       Hydratome sphincterotome was passed over the guidewire and the bile duct       was then deeply cannulated. Contrast was injected. I personally       interpreted the bile duct images. Ductal flow of contrast was adequate.       Image quality was adequate. Contrast extended to the hepatic ducts.        Opacification of the entire biliary tree except for the gallbladder was       successful. The maximum diameter of the ducts was 8 mm. To discover       objects, the biliary tree was swept with a retrieval balloon. Sludge was       swept from the duct. An occlusion cholangiogram was performed that       showed no further significant biliary pathology with contrast extending       to the cystic duct but without evidence of further leak.      A pancreatogram was not performed.      The duodenoscope was withdrawn from the patient. Impression:               - Z-line regular, 42 cm from the incisors.                           - 2 cm hiatal hernia.                           - Erythematous mucosa in the stomach - biopsied for                            HP evaluation.                           - No gross lesions in the duodenal bulb, in the                            first portion of the duodenum and in the second                            portion of the duodenum.                           - Prior biliary sphincterotomy appeared open. One                            stent from the biliary tree was seen in the major                            papilla - this was removed.                           - The biliary tree was swept and sludge was found.                           -  Occlusion cholangiogram showed no further                            evidence of leak or other biliary pathology. Moderate Sedation:      Not Applicable - Patient had care per Anesthesia. Recommendation:           - The patient will be observed post-procedure,                            until all discharge criteria are met.                           - Discharge patient to home.                           - Patient has a contact number available for                            emergencies. The signs and symptoms of potential                            delayed complications were discussed with the                             patient. Return to normal activities tomorrow.                            Written discharge instructions were provided to the                            patient.                           - Low fat diet.                           - Observe patient's clinical course.                           - Check liver enzymes (AST, ALT, alkaline                            phosphatase, bilirubin) in 2 weeks.                           - Watch for pancreatitis, bleeding, perforation,                            and cholangitis.                           - Patient will be set up for follow up in clinic.                            She is overdue for Colonoscopy for screening  purposes. She will need to be done in the                            hospital-based setting, and if she is willing to                            get on our list, we can proceed with scheduling or                            seeing her in clinic and scheduling from there                            (will discuss after procedure today).                           - The findings and recommendations were discussed                            with the patient.                           - The findings and recommendations were discussed                            with the designated responsible adult. Procedure Code(s):        --- Professional ---                           [phone], Endoscopic retrograde                            cholangiopancreatography (ERCP); with removal of                            foreign body(s) or stent(s) from biliary/pancreatic                            duct(s)                           43264, Endoscopic retrograde                            cholangiopancreatography (ERCP); with removal of                            calculi/debris from biliary/pancreatic duct(s)                           43261, Endoscopic retrograde                            cholangiopancreatography (ERCP); with biopsy,                             single or multiple Diagnosis Code(s):        ---  Professional ---                           K44.9, Diaphragmatic hernia without obstruction or                            gangrene                           K31.89, Other diseases of stomach and duodenum                           Z96.89, Presence of other specified functional                            implants                           Z46.59, Encounter for fitting and adjustment of                            other gastrointestinal appliance and device                           K83.8, Other specified diseases of biliary tract CPT copyright 2019 American Medical Association. All rights reserved. The codes documented in this report are preliminary and upon coder review may  be revised to meet current compliance requirements. Justice Britain, MD 07/16/2021 9:56:46 AM Number of Addenda: 0

## 2021-07-17 ENCOUNTER — Encounter (HOSPITAL_COMMUNITY): Payer: Self-pay | Admitting: Gastroenterology

## 2021-07-17 LAB — SURGICAL PATHOLOGY

## 2021-07-17 NOTE — Telephone Encounter (Signed)
The pt has been advised to have labs in 2-4 weeks She has also been advised of the appt to discuss colon.  No questions at this time.

## 2021-07-18 ENCOUNTER — Other Ambulatory Visit: Payer: 59

## 2021-07-18 ENCOUNTER — Encounter: Payer: Self-pay | Admitting: Gastroenterology

## 2021-09-03 ENCOUNTER — Other Ambulatory Visit (INDEPENDENT_AMBULATORY_CARE_PROVIDER_SITE_OTHER): Payer: 59

## 2021-09-03 DIAGNOSIS — K819 Cholecystitis, unspecified: Secondary | ICD-10-CM | POA: Diagnosis not present

## 2021-09-03 DIAGNOSIS — K839 Disease of biliary tract, unspecified: Secondary | ICD-10-CM | POA: Diagnosis not present

## 2021-09-03 LAB — HEPATIC FUNCTION PANEL
ALT: 21 U/L (ref 0–35)
AST: 13 U/L (ref 0–37)
Albumin: 4.3 g/dL (ref 3.5–5.2)
Alkaline Phosphatase: 153 U/L — ABNORMAL HIGH (ref 39–117)
Bilirubin, Direct: 0.1 mg/dL (ref 0.0–0.3)
Total Bilirubin: 0.5 mg/dL (ref 0.2–1.2)
Total Protein: 7.5 g/dL (ref 6.0–8.3)

## 2021-09-04 ENCOUNTER — Other Ambulatory Visit: Payer: Self-pay

## 2021-09-04 DIAGNOSIS — K839 Disease of biliary tract, unspecified: Secondary | ICD-10-CM

## 2021-09-04 DIAGNOSIS — K819 Cholecystitis, unspecified: Secondary | ICD-10-CM

## 2021-09-16 ENCOUNTER — Ambulatory Visit (INDEPENDENT_AMBULATORY_CARE_PROVIDER_SITE_OTHER): Payer: 59 | Admitting: Gastroenterology

## 2021-09-16 VITALS — BP 130/90 | HR 106 | Ht 65.0 in | Wt 311.0 lb

## 2021-09-16 DIAGNOSIS — Z1211 Encounter for screening for malignant neoplasm of colon: Secondary | ICD-10-CM

## 2021-09-16 DIAGNOSIS — Z9889 Other specified postprocedural states: Secondary | ICD-10-CM

## 2021-09-16 DIAGNOSIS — K8050 Calculus of bile duct without cholangitis or cholecystitis without obstruction: Secondary | ICD-10-CM

## 2021-09-16 DIAGNOSIS — K839 Disease of biliary tract, unspecified: Secondary | ICD-10-CM | POA: Diagnosis not present

## 2021-09-16 DIAGNOSIS — R748 Abnormal levels of other serum enzymes: Secondary | ICD-10-CM

## 2021-09-16 NOTE — Patient Instructions (Addendum)
If you are age 52 or older, your body mass index should be between 23-30. Your Body mass index is 51.75 kg/m. If this is out of the aforementioned range listed, please consider follow up with your Primary Care Provider.  If you are age 71 or younger, your body mass index should be between 19-25. Your Body mass index is 51.75 kg/m. If this is out of the aformentioned range listed, please consider follow up with your Primary Care Provider.   We will call you to schedule when October schedule comes out.   Return for labs in 2 months.  The [facility] providers would like to encourage you to use [facility] to communicate with providers for non-urgent requests or questions.  Due to long hold times on the telephone, sending your provider a message by [facility] may be a faster and more efficient way to get a response.  Please allow 48 business hours for a response.  Please remember that this is for non-urgent requests.   It was a pleasure to see you today!  Thank you for trusting me with your gastrointestinal care!    Dr.Mansouraty

## 2021-09-16 NOTE — Progress Notes (Unsigned)
[place] VISIT   Primary Care Provider Jinny Sanders, MD [place] [address] [phone]   Patient Profile: Jamie Mckenzie is a 51 y.o. female with a pmh significant for hypertension, hypothyroidism, morbid obesity, status post cholecystectomy (history of choledocholithiasis status post ERCP as well as a bile leak status post stenting now improved), GERD, chronically elevated alkaline phosphatase.  The patient presents to the [facility] for an evaluation and management of problem(s) noted below:  Problem List 1. Bile leak   2. Colon cancer screening   3. Elevated alkaline phosphatase level   4. History of ERCP     History of Present Illness This is the patient's first visit to the outpatient [place] clinic.  I met the patient in February when she presented with biliary leak after gangrenous gallbladder cholecystectomy.  ERCP was performed that showed evidence of a biliary leak as well as a stone that was removed.  Stent was left in place and removed in May where she had no evidence of further leak.  The patient is doing well.  She states that she is getting stronger and feeling better than she has and did not really know how sick she was from a gallbladder standpoint until now when she no longer has it in place.  The patient has not had any recurrence of abdominal pain or discomfort.  The patient has a need for colon cancer screening as she was recommended colon cancer screening at the age of 89 but did not pursue that at that time.  She has a second-degree relative with a history of colon polyps but no first-degree relatives.  No family history of colon cancer.  She has a normal bowel movement on a regular basis.  She has not noted any issues with rectal bleeding except a few years ago when she had a short-lived "colitis".   She has never had a endoscopy or colonoscopy.  She makes mention of longstanding alkaline  phosphatase elevation for years.  GI Review of Systems Positive as above Negative for dysphagia, odynophagia, nausea, vomiting, pain, alteration of bowel habits, melena  Review of Systems General: Denies fevers/chills/weight loss unintentionally Cardiovascular: Denies chest pain Pulmonary: Denies shortness of breath Gastroenterological: See HPI Genitourinary: Denies darkened urine Hematological: Denies easy bruising/bleeding Dermatological: Denies jaundice Psychological: Mood is stable   Medications Current Outpatient Medications  Medication Sig Dispense Refill   acetaminophen (TYLENOL) 500 MG tablet Take 1,000 mg by mouth every 6 (six) hours as needed for mild pain.     albuterol (VENTOLIN HFA) 108 (90 Base) MCG/ACT inhaler Inhale 2 puffs into the lungs every 6 (six) hours as needed for wheezing or shortness of breath.     Ascorbic Acid (VITAMIN C) 1000 MG tablet Take 1,000 mg by mouth daily.     augmented betamethasone dipropionate (DIPROLENE-AF) 0.05 % ointment Apply topically daily.     Elderberry 500 MG CAPS Take 500 mg by mouth daily.     ibuprofen (ADVIL) 200 MG tablet Take 600-800 mg by mouth every 6 (six) hours as needed for headache or mild pain.     levothyroxine (SYNTHROID) 25 MCG tablet Take 1 tablet (25 mcg total) by mouth daily. 90 tablet 1   losartan-hydrochlorothiazide (HYZAAR) 50-12.5 MG tablet Take 1 tablet by mouth daily. 30 tablet 0   Multiple Vitamins-Minerals (MULTI FOR HER PO) Take 1 tablet by mouth daily.     omeprazole (PRILOSEC) 20 MG capsule Take 1 capsule (  20 mg total) by mouth daily. 90 capsule 1   triamcinolone cream (KENALOG) 0.1 % Apply 1 application. topically 2 (two) times daily as needed (rash). (Patient not taking: Reported on 09/16/2021)     No current facility-administered medications for this visit.    Allergies No Known Allergies  Histories Past Medical History:  Diagnosis Date   Abrasion of right wrist 04/15/2012   Arthritis     ankles   Cholecystitis, acute 02/2021   Cough 04/15/2012   Dental crowns present    GERD (gastroesophageal reflux disease)    Hypertension    Hypothyroidism    Morbid obesity ([place])    Perimenopausal 03/26/2012   irregular periods   Retained orthopedic hardware 03/26/2012   left ankle   Stuffy and runny nose    clear drainage from nose - URI onset 03/30/2012   Past Surgical History:  Procedure Laterality Date   BILIARY STENT PLACEMENT  03/26/2021   Procedure: BILIARY STENT PLACEMENT;  Surgeon: [name]., MD;  Location: [place];  Service: Gastroenterology;;   BILIARY STENT PLACEMENT N/A 07/16/2021   Procedure: BILIARY STENT REMOVAL;  Surgeon: [name]., MD;  Location: [facility];  Service: Gastroenterology;  Laterality: N/A;   BIOPSY  07/16/2021   Procedure: BIOPSY;  Surgeon: [name]., MD;  Location: [facility];  Service: Gastroenterology;;   BREAST BIOPSY Left 10/23/2014   Benign   CHOLECYSTECTOMY N/A 03/17/2021   Procedure: LAPAROSCOPIC CHOLECYSTECTOMY WITH ICG;  Surgeon: Rolm Bookbinder, MD;  Location: [place];  Service: General;  Laterality: N/A;   DILATION AND EVACUATION  11/15/2008   ENDOSCOPIC RETROGRADE CHOLANGIOPANCREATOGRAPHY (ERCP) WITH PROPOFOL N/A 03/26/2021   Procedure: ENDOSCOPIC RETROGRADE CHOLANGIOPANCREATOGRAPHY (ERCP) WITH PROPOFOL;  Surgeon: [name]., MD;  Location: [place];  Service: Gastroenterology;  Laterality: N/A;   ENDOSCOPIC RETROGRADE CHOLANGIOPANCREATOGRAPHY (ERCP) WITH PROPOFOL N/A 07/16/2021   Procedure: ENDOSCOPIC RETROGRADE CHOLANGIOPANCREATOGRAPHY (ERCP) WITH PROPOFOL;  Surgeon: Rush Landmark Telford Nab., MD;  Location: [facility];  Service: Gastroenterology;  Laterality: N/A;   HARDWARE REMOVAL Left 04/21/2012   Procedure: HARDWARE REMOVAL;  Surgeon: Wylene Simmer, MD;  Location: [place];  Service: Orthopedics;  Laterality: Left;  LEFT ANKLE HARDWARE REMOVAL    HYSTEROSCOPY WITH D & C  10/24/2009   with polypectomy   MULTIPLE TOOTH EXTRACTIONS     ORIF ANKLE FRACTURE Right 1985   ORIF ANKLE FRACTURE  11/03/2011   Procedure: OPEN REDUCTION INTERNAL FIXATION (ORIF) ANKLE FRACTURE;  Surgeon: Wylene Simmer, MD;  Location: [place];  Service: Orthopedics;  Laterality: Left;  Open Reduction Internal Fixation left ankle lateral malleolus fracture, left syndesmosis   REMOVAL OF STONES  03/26/2021   Procedure: REMOVAL OF STONES;  Surgeon: Rush Landmark Telford Nab., MD;  Location: [place];  Service: Gastroenterology;;   REMOVAL OF STONES  07/16/2021   Procedure: REMOVAL OF SLUDGE;  Surgeon: [name]., MD;  Location: [facility];  Service: Gastroenterology;;   Joan Mayans  03/26/2021   Procedure: Joan Mayans;  Surgeon: [name]., MD;  Location: [facility];  Service: Gastroenterology;;   Social History   Socioeconomic History   Marital status: Single    Spouse name: Not on file   Number of children: 0   Years of education: Not on file   Highest education level: Not on file  Occupational History   Occupation: Registered Nurse    Employer: [employer]  Tobacco Use   Smoking status: Light Smoker    Years: 10.00  Types: Cigarettes   Smokeless tobacco: Never   Tobacco comments:    2-3 cig./day  Vaping Use   Vaping Use: Never used  Substance and Sexual Activity   Alcohol use: Yes    Comment: socially   Drug use: No   Sexual activity: Not on file  Other Topics Concern   Not on file  Social History Narrative   Not on file   Social Determinants of Health   Financial Resource Strain: Not on file  Food Insecurity: No Food Insecurity (01/23/2020)   Hunger Vital Sign    Worried About Running Out of Food in the Last Year: Never true    Ran Out of Food in the Last Year: Never true  Transportation Needs: Not on file  Physical Activity: Not on file  Stress: Not on file  Social  Connections: Not on file  Intimate Partner Violence: Not on file   Family History  Problem Relation Age of Onset   Hypertension Mother    Arthritis Mother    Obesity Mother        morbid   Myasthenia gravis Mother    Cancer Father        non hodgkins lymphoma   Breast cancer Maternal Aunt    Cancer Maternal Grandmother        lung   Heart disease Maternal Grandfather        heart attack   COPD Paternal Grandfather    Colon cancer Neg Hx    Esophageal cancer Neg Hx    Inflammatory bowel disease Neg Hx    Liver disease Neg Hx    Pancreatic cancer Neg Hx    Rectal cancer Neg Hx    Stomach cancer Neg Hx    I have reviewed her medical, social, and family history in detail and updated the electronic medical record as necessary.    PHYSICAL EXAMINATION  BP 130/90   Pulse (!) 106   Ht _0  (1.651 m)   Wt (!) 311 lb (141.1 kg)   BMI 51.75 kg/m  Wt Readings from Last 3 Encounters:  09/16/21 (!) 311 lb (141.1 kg)  07/16/21 300 lb (136.1 kg)  03/17/21 (!) 304 lb 3.2 oz (138 kg)  GEN: NAD, appears stated age, doesn't appear chronically ill PSYCH: Cooperative, without pressured speech EYE: Conjunctivae pink, sclerae anicteric ENT: MMM CV: Nontachycardic RESP: No audible wheezing GI: NABS, soft, protuberant abdomen, rounded, nontender, without rebound or guarding  MSK/EXT: Lower extremity edema bilaterally SKIN: No jaundice, no spider angiomata NEURO:  Alert & Oriented x 3, no focal deficits   REVIEW OF DATA  I reviewed the following data at the time of this encounter:  GI Procedures and Studies  May 2023 ERCP - Z-line regular, 42 cm from the incisors. - 2 cm hiatal hernia. - Erythematous mucosa in the stomach - biopsied for HP evaluation. - No gross lesions in the duodenal bulb, in the first portion of the duodenum and in the second portion of the duodenum. - Prior biliary sphincterotomy appeared open. One stent from the biliary tree was seen in the major papilla -  this was removed. - The biliary tree was swept and sludge was found. - Occlusion cholangiogram showed no further evidence of leak or other biliary pathology.  Pathology FINAL MICROSCOPIC DIAGNOSIS:  A. STOMACH, BIOPSY:  Minimal chronic gastritis with reactive epithelial changes  Negative for H. pylori, intestinal metaplasia, dysplasia and carcinoma   February 2023 ERCP - A large amount of food (residue)  in the stomach. - The major papilla appeared to be prominent. - A filling defect consistent with a stone and sludge was seen on the cholangiogram. - A mild bile leak was found. - Choledocholithiasis was found. Complete removal was accomplished by biliary sphincterotomy and balloon sweep. - One plastic biliary stent was placed into the common bile duct to aid in healing the biliary leak.  Laboratory Studies  Reviewed those in epic   05/16/10 09:58 11/05/11 08:34 11/10/12 08:48 12/16/13 21:51 05/22/14 08:13 10/03/15 08:55 12/30/18 09:52 12/25/19 08:32 02/05/20 00:22 03/17/21 01:20 03/18/21 01:43 03/26/21 09:40 03/27/21 00:57 09/03/21 07:37  Alkaline Phosphatase 125 (H) 100 127 (H) 181 (H) 138 (H) 142 (H) 152 (H) 140 (H) 122 154 (H) 178 (H) 166 (H) 132 (H) 153 (H)  AST _0 177 (H) 126 (H) 228 (H) 58 (H) 13  ALT 24 32 24 45 (H) _1 36 230 (H) 240 (H) 149 (H) 114 (H) 21  Bilirubin, Direct 0.1          0.1   0.1   Imaging Studies  February 2023 CT abdomen pelvis with contrast IMPRESSION: 1. Status post interval cholecystectomy. There is a small collection of intermediate attenuation material within the gallbladder fossa which is a nonspecific finding and may represent postoperative seroma, hematoma and or Surgicel. If there is a clinical concern for bile leak consider nuclear medicine hepatobiliary scan. 2. Mild intrahepatic bile duct dilatation with common bile duct measuring up to 9 mm. No CT visible stones identified within the common bile  duct.   ASSESSMENT  Ms. Grout is a 51 y.o. female with a pmh significant for hypertension, hypothyroidism, morbid obesity, status post cholecystectomy (history of choledocholithiasis status post ERCP as well as a bile leak status post stenting now improved), GERD, chronically elevated alkaline phosphatase.  The patient is seen today for evaluation and management of:  1. Bile leak   2. Colon cancer screening   3. Elevated alkaline phosphatase level   4. History of ERCP    The patient is hemodynamically and clinically stable.  Thankfully she seems to have done well post cholecystectomy and ERCPs at this point.  She has a longstanding history of elevated alkaline phosphatase level.  Additional work-up will need to be considered if the patient continues to have elevated alkaline phosphatase now that her gallbladder disease seems to not be a reason for issues.  We will plan to recheck labs in the coming weeks to see where things stand and try to rule out PBC.  Certainly now the gallbladder is her liver tests may move towards a more normal pattern.  We will hold on liver biopsy for now.  Patient is due for colon cancer screening.  We discussed different modalities of colon cancer screening including colonoscopy, Cologuard testing, FIT-DNA based testing.  The risks and benefits of endoscopic evaluation were discussed with the patient; these include but are not limited to the risk of perforation, infection, bleeding, missed lesions, lack of diagnosis, severe illness requiring hospitalization, as well as anesthesia and sedation related illnesses.  The patient and/or family is agreeable to proceed with colonoscopy.  Her procedure will need to be done in the hospital-based setting due to her BMI.  All patient questions were answered to the best of my ability, and the patient agrees to the aforementioned plan of action with follow-up as indicated.   PLAN  Proceed with scheduling colonoscopy for screening  purposes  Laboratories as outlined below to further evaluate longer standing alkaline phosphatase elevation in 2 months Holding on liver biopsy for now   Orders Placed This Encounter  Procedures   Procedural/ Surgical Case Request: COLONOSCOPY WITH PROPOFOL   Anti-smooth muscle antibody, IgG   Hepatic function panel   Alkaline Phosphatase, Isoenzymes    New Prescriptions   No medications on file   Modified Medications   No medications on file    Planned Follow Up Return in about 2 months (around 11/17/2021).   Total Time in Face-to-Face and in Coordination of Care for patient including independent/personal interpretation/review of prior testing, medical history, examination, medication adjustment, communicating results with the patient directly, and documentation within the EHR is 25 minutes.   Justice Britain, MD [facility] Office # [phone]

## 2021-09-18 ENCOUNTER — Other Ambulatory Visit: Payer: Self-pay | Admitting: Family Medicine

## 2021-09-18 ENCOUNTER — Encounter: Payer: Self-pay | Admitting: Family Medicine

## 2021-09-18 ENCOUNTER — Other Ambulatory Visit (HOSPITAL_COMMUNITY): Payer: Self-pay

## 2021-09-18 DIAGNOSIS — R748 Abnormal levels of other serum enzymes: Secondary | ICD-10-CM | POA: Insufficient documentation

## 2021-09-18 DIAGNOSIS — Z1211 Encounter for screening for malignant neoplasm of colon: Secondary | ICD-10-CM | POA: Insufficient documentation

## 2021-09-18 DIAGNOSIS — Z9889 Other specified postprocedural states: Secondary | ICD-10-CM | POA: Insufficient documentation

## 2021-09-19 ENCOUNTER — Other Ambulatory Visit (HOSPITAL_COMMUNITY): Payer: Self-pay

## 2021-09-19 MED ORDER — LOSARTAN POTASSIUM-HCTZ 50-12.5 MG PO TABS
1.0000 | ORAL_TABLET | Freq: Every day | ORAL | 0 refills | Status: DC
  Filled 2021-09-19: qty 30, 30d supply, fill #0

## 2021-10-08 ENCOUNTER — Other Ambulatory Visit (HOSPITAL_COMMUNITY): Payer: Self-pay

## 2021-10-08 DIAGNOSIS — E8881 Metabolic syndrome: Secondary | ICD-10-CM | POA: Diagnosis not present

## 2021-10-08 DIAGNOSIS — Z713 Dietary counseling and surveillance: Secondary | ICD-10-CM | POA: Diagnosis not present

## 2021-10-08 DIAGNOSIS — Z136 Encounter for screening for cardiovascular disorders: Secondary | ICD-10-CM | POA: Diagnosis not present

## 2021-10-08 DIAGNOSIS — Z7182 Exercise counseling: Secondary | ICD-10-CM | POA: Diagnosis not present

## 2021-10-08 DIAGNOSIS — I10 Essential (primary) hypertension: Secondary | ICD-10-CM | POA: Diagnosis not present

## 2021-10-08 DIAGNOSIS — Z6841 Body Mass Index (BMI) 40.0 and over, adult: Secondary | ICD-10-CM | POA: Diagnosis not present

## 2021-10-08 DIAGNOSIS — E039 Hypothyroidism, unspecified: Secondary | ICD-10-CM | POA: Diagnosis not present

## 2021-10-08 MED ORDER — WEGOVY 0.25 MG/0.5ML ~~LOC~~ SOAJ
0.2500 mg | SUBCUTANEOUS | 0 refills | Status: DC
  Filled 2021-10-08 – 2021-10-22 (×4): qty 2, 28d supply, fill #0

## 2021-10-15 ENCOUNTER — Other Ambulatory Visit (HOSPITAL_COMMUNITY): Payer: Self-pay

## 2021-10-17 ENCOUNTER — Other Ambulatory Visit (HOSPITAL_COMMUNITY): Payer: Self-pay

## 2021-10-22 ENCOUNTER — Other Ambulatory Visit (HOSPITAL_COMMUNITY): Payer: Self-pay

## 2021-11-06 ENCOUNTER — Other Ambulatory Visit (INDEPENDENT_AMBULATORY_CARE_PROVIDER_SITE_OTHER): Payer: 59

## 2021-11-06 DIAGNOSIS — E039 Hypothyroidism, unspecified: Secondary | ICD-10-CM

## 2021-11-06 DIAGNOSIS — K819 Cholecystitis, unspecified: Secondary | ICD-10-CM | POA: Diagnosis not present

## 2021-11-06 DIAGNOSIS — R748 Abnormal levels of other serum enzymes: Secondary | ICD-10-CM

## 2021-11-06 DIAGNOSIS — Z9889 Other specified postprocedural states: Secondary | ICD-10-CM

## 2021-11-06 DIAGNOSIS — K839 Disease of biliary tract, unspecified: Secondary | ICD-10-CM | POA: Diagnosis not present

## 2021-11-06 DIAGNOSIS — Z1211 Encounter for screening for malignant neoplasm of colon: Secondary | ICD-10-CM

## 2021-11-06 LAB — HEPATIC FUNCTION PANEL
ALT: 21 U/L (ref 0–35)
AST: 14 U/L (ref 0–37)
Albumin: 3.9 g/dL (ref 3.5–5.2)
Alkaline Phosphatase: 165 U/L — ABNORMAL HIGH (ref 39–117)
Bilirubin, Direct: 0.1 mg/dL (ref 0.0–0.3)
Total Bilirubin: 0.5 mg/dL (ref 0.2–1.2)
Total Protein: 7 g/dL (ref 6.0–8.3)

## 2021-11-06 LAB — T4, FREE: Free T4: 0.79 ng/dL (ref 0.60–1.60)

## 2021-11-06 LAB — T3, FREE: T3, Free: 3.2 pg/mL (ref 2.3–4.2)

## 2021-11-06 NOTE — Progress Notes (Signed)
No critical labs need to be addressed urgently. We will discuss labs in detail at upcoming office visit.   

## 2021-11-07 ENCOUNTER — Other Ambulatory Visit: Payer: 59

## 2021-11-07 ENCOUNTER — Other Ambulatory Visit: Payer: Self-pay

## 2021-11-07 DIAGNOSIS — K819 Cholecystitis, unspecified: Secondary | ICD-10-CM

## 2021-11-07 DIAGNOSIS — K839 Disease of biliary tract, unspecified: Secondary | ICD-10-CM

## 2021-11-07 DIAGNOSIS — Z9889 Other specified postprocedural states: Secondary | ICD-10-CM

## 2021-11-10 ENCOUNTER — Other Ambulatory Visit: Payer: Self-pay | Admitting: Gastroenterology

## 2021-11-10 DIAGNOSIS — R748 Abnormal levels of other serum enzymes: Secondary | ICD-10-CM

## 2021-11-10 LAB — ALKALINE PHOSPHATASE, ISOENZYMES
Alkaline Phosphatase: 182 IU/L — ABNORMAL HIGH (ref 44–121)
BONE FRACTION: 27 % (ref 14–68)
INTESTINAL FRAC.: 8 % (ref 0–18)
LIVER FRACTION: 65 % (ref 18–85)

## 2021-11-10 LAB — ANTI-SMOOTH MUSCLE ANTIBODY, IGG: Actin (Smooth Muscle) Antibody (IGG): <20 U (ref <20)

## 2021-11-11 ENCOUNTER — Encounter: Payer: Self-pay | Admitting: Family Medicine

## 2021-11-11 ENCOUNTER — Ambulatory Visit (INDEPENDENT_AMBULATORY_CARE_PROVIDER_SITE_OTHER): Payer: 59 | Admitting: Family Medicine

## 2021-11-11 ENCOUNTER — Other Ambulatory Visit (HOSPITAL_COMMUNITY): Payer: Self-pay

## 2021-11-11 VITALS — BP 128/90 | HR 78 | Temp 97.6°F | Ht 64.75 in | Wt 310.4 lb

## 2021-11-11 DIAGNOSIS — I10 Essential (primary) hypertension: Secondary | ICD-10-CM | POA: Diagnosis not present

## 2021-11-11 DIAGNOSIS — F3340 Major depressive disorder, recurrent, in remission, unspecified: Secondary | ICD-10-CM | POA: Diagnosis not present

## 2021-11-11 DIAGNOSIS — Z6841 Body Mass Index (BMI) 40.0 and over, adult: Secondary | ICD-10-CM

## 2021-11-11 DIAGNOSIS — Z0000 Encounter for general adult medical examination without abnormal findings: Secondary | ICD-10-CM

## 2021-11-11 DIAGNOSIS — E039 Hypothyroidism, unspecified: Secondary | ICD-10-CM

## 2021-11-11 LAB — MITOCHONDRIAL ANTIBODIES: Mitochondrial M2 Ab, IgG: <=20 U (ref <=20.0)

## 2021-11-11 MED ORDER — LOSARTAN POTASSIUM-HCTZ 50-12.5 MG PO TABS
1.0000 | ORAL_TABLET | Freq: Every day | ORAL | 3 refills | Status: DC
  Filled 2021-11-11: qty 90, 90d supply, fill #0
  Filled 2022-01-21 – 2022-01-22 (×2): qty 90, 90d supply, fill #1

## 2021-11-11 MED ORDER — LEVOTHYROXINE SODIUM 25 MCG PO TABS
25.0000 ug | ORAL_TABLET | Freq: Every day | ORAL | 3 refills | Status: DC
  Filled 2021-11-11: qty 90, 90d supply, fill #0
  Filled 2022-01-21 – 2022-01-22 (×2): qty 90, 90d supply, fill #1

## 2021-11-11 MED ORDER — DOXYCYCLINE HYCLATE 100 MG PO TABS
100.0000 mg | ORAL_TABLET | Freq: Two times a day (BID) | ORAL | 0 refills | Status: DC
  Filled 2021-11-11: qty 14, 7d supply, fill #0

## 2021-11-11 NOTE — Assessment & Plan Note (Signed)
Chronic, stable control on no medication 

## 2021-11-11 NOTE — Progress Notes (Signed)
Patient ID: Jamie Mckenzie, female    DOB: Oct 30, 1970, 51 y.o.   MRN: [id number]  This visit was conducted in person.  BP (!) 128/90 (BP Location: Left Arm, Patient Position: Sitting, Cuff Size: Large)   Pulse 78   Temp 97.6 F (36.4 C) (Temporal)   Ht 5' 4.75" (1.645 m)   Wt (!) 310 lb 6 oz (140.8 kg)   LMP 11/23/2020   SpO2 95%   BMI 52.05 kg/m    CC:  Chief Complaint  Patient presents with   Annual Exam    Subjective:   HPI: Jamie Mckenzie is a 51 y.o. female presenting on 11/11/2021 for Annual Exam  She reports that in  2021 after starting Trulciity.. had dehydration and colitis. Lost 35 lbs quickly in 2022... had gallbladder issues start.  Had cholecystectomy 02/2021  Followed by bile leak.. stent placed  Stent removed 06/2021  Hypertension:  On losartan HCTZ  50/12.5 mg daily BP Readings from Last 3 Encounters:  11/11/21 (!) 128/90  09/16/21 130/90  07/16/21 133/73  Using medication without problems or lightheadedness:  none Chest pain with exertion: none Edema: occ at night Short of breath: some Average home BPs: Other issues:   3 weeks ago She stared  semaglutide 0.25 mg ... tolerating well overall.  Has noted  decreased appetite.   MDD,  good control. Not on any medication.     Hypothyroid :  levo 25 mcg daily    Wt Readings from Last 3 Encounters:  11/11/21 (!) 310 lb 6 oz (140.8 kg)  09/16/21 (!) 311 lb (141.1 kg)  07/16/21 300 lb (136.1 kg)   Body mass index is 52.05 kg/m.   Total chol: 178 LDL 105  Tri 227   She moving and changing jobs.  Has new doctor in [place] with [facility].       Relevant past medical, surgical, family and social history reviewed and updated as indicated. Interim medical history since our last visit reviewed. Allergies and medications reviewed and updated. Outpatient Medications Prior to Visit  Medication Sig Dispense Refill   acetaminophen (TYLENOL) 500 MG tablet Take 1,000 mg by mouth every 6  (six) hours as needed for mild pain.     albuterol (VENTOLIN HFA) 108 (90 Base) MCG/ACT inhaler Inhale 2 puffs into the lungs every 6 (six) hours as needed for wheezing or shortness of breath.     Ascorbic Acid (VITAMIN C) 1000 MG tablet Take 1,000 mg by mouth daily.     augmented betamethasone dipropionate (DIPROLENE-AF) 0.05 % ointment Apply topically daily.     Elderberry 500 MG CAPS Take 500 mg by mouth daily.     ibuprofen (ADVIL) 200 MG tablet Take 600-800 mg by mouth every 6 (six) hours as needed for headache or mild pain.     levothyroxine (SYNTHROID) 25 MCG tablet Take 1 tablet (25 mcg total) by mouth daily. 90 tablet 1   losartan-hydrochlorothiazide (HYZAAR) 50-12.5 MG tablet Take 1 tablet by mouth daily. 30 tablet 0   Multiple Vitamins-Minerals (MULTI FOR HER PO) Take 1 tablet by mouth daily.     omeprazole (PRILOSEC) 20 MG capsule Take 1 capsule (20 mg total) by mouth daily. 90 capsule 1   Semaglutide-Weight Management (WEGOVY) 0.25 MG/0.5ML SOAJ Inject 0.25 mg into the skin once a week 2 mL 0   No facility-administered medications prior to visit.     Per HPI unless specifically indicated in ROS section below Review of Systems  Constitutional:  Negative for fatigue and fever.  HENT:  Negative for congestion.   Eyes:  Negative for pain.  Respiratory:  Negative for cough and shortness of breath.   Cardiovascular:  Negative for chest pain, palpitations and leg swelling.  Gastrointestinal:  Negative for abdominal pain.  Genitourinary:  Negative for dysuria and vaginal bleeding.  Musculoskeletal:  Negative for back pain.  Neurological:  Negative for syncope, light-headedness and headaches.  Psychiatric/Behavioral:  Negative for dysphoric mood.    Objective:  BP (!) 128/90 (BP Location: Left Arm, Patient Position: Sitting, Cuff Size: Large)   Pulse 78   Temp 97.6 F (36.4 C) (Temporal)   Ht 5' 4.75" (1.645 m)   Wt (!) 310 lb 6 oz (140.8 kg)   LMP 11/23/2020   SpO2 95%   BMI  52.05 kg/m   Wt Readings from Last 3 Encounters:  11/11/21 (!) 310 lb 6 oz (140.8 kg)  09/16/21 (!) 311 lb (141.1 kg)  07/16/21 300 lb (136.1 kg)      Physical Exam Constitutional:      General: She is not in acute distress.    Appearance: Normal appearance. She is well-developed. She is obese. She is not ill-appearing or toxic-appearing.  HENT:     Head: Normocephalic.     Right Ear: Hearing, tympanic membrane, ear canal and external ear normal. Tympanic membrane is not erythematous, retracted or bulging.     Left Ear: Hearing, tympanic membrane, ear canal and external ear normal. Tympanic membrane is not erythematous, retracted or bulging.     Nose: No mucosal edema or rhinorrhea.     Right Sinus: No maxillary sinus tenderness or frontal sinus tenderness.     Left Sinus: No maxillary sinus tenderness or frontal sinus tenderness.     Mouth/Throat:     Pharynx: Uvula midline.  Eyes:     General: Lids are normal. Lids are everted, no foreign bodies appreciated.     Conjunctiva/sclera: Conjunctivae normal.     Pupils: Pupils are equal, round, and reactive to light.  Neck:     Thyroid: No thyroid mass or thyromegaly.     Vascular: No carotid bruit.     Trachea: Trachea normal.  Cardiovascular:     Rate and Rhythm: Normal rate and regular rhythm.     Pulses: Normal pulses.     Heart sounds: Normal heart sounds, S1 normal and S2 normal. No murmur heard.    No friction rub. No gallop.  Pulmonary:     Effort: Pulmonary effort is normal. No tachypnea or respiratory distress.     Breath sounds: Normal breath sounds. No decreased breath sounds, wheezing, rhonchi or rales.  Abdominal:     General: Bowel sounds are normal.     Palpations: Abdomen is soft.     Tenderness: There is no abdominal tenderness.  Musculoskeletal:     Cervical back: Normal range of motion and neck supple.  Skin:    General: Skin is warm and dry.     Findings: No rash.  Neurological:     Mental Status: She  is alert.  Psychiatric:        Mood and Affect: Mood is not anxious or depressed.        Speech: Speech normal.        Behavior: Behavior normal. Behavior is cooperative.        Thought Content: Thought content normal.        Judgment: Judgment normal.       Results for  orders placed or performed in visit on 11/06/21  T4, free  Result Value Ref Range   Free T4 0.79 0.60 - 1.60 ng/dL  T3, free  Result Value Ref Range   T3, Free 3.2 2.3 - 4.2 pg/mL  Hepatic function panel  Result Value Ref Range   Total Bilirubin 0.5 0.2 - 1.2 mg/dL   Bilirubin, Direct 0.1 0.0 - 0.3 mg/dL   Alkaline Phosphatase 165 (H) 39 - 117 U/L   AST 14 0 - 37 U/L   ALT 21 0 - 35 U/L   Total Protein 7.0 6.0 - 8.3 g/dL   Albumin 3.9 3.5 - 5.2 g/dL  Anti-smooth muscle antibody, IgG  Result Value Ref Range   Actin (Smooth Muscle) Antibody (IGG) <20 <20 U  Alkaline Phosphatase, Isoenzymes  Result Value Ref Range   Alkaline Phosphatase 182 (H) 44 - 121 IU/L   LIVER FRACTION 65 18 - 85 %   BONE FRACTION 27 14 - 68 %   INTESTINAL FRAC. 8 0 - 18 %     COVID 19 screen:  No recent travel or known exposure to COVID19 The patient denies respiratory symptoms of COVID 19 at this time. The importance of social distancing was discussed today.   Assessment and Plan   The patient's preventative maintenance and recommended screening tests for an annual wellness exam were reviewed in full today. Brought up to date unless services declined.  Counselled on the importance of diet, exercise, and its role in overall health and mortality. The patient's FH and SH was reviewed, including their home life, tobacco status, and drug and alcohol status.   Due for hep C  Vaccines: consider shingrix and flu vaccine ( will do at work) PAP: 2021, repeat in 2026 Mammogram 01/2021 further eval  was unremarkable, back on routine basis  Has colonoscopy scheduled  11/2021 Dr Rush Landmark  Problem List Items Addressed This Visit      Class 3 severe obesity due to excess calories with serious comorbidity and body mass index (BMI) of 50.0 to 59.9 in adult [facility]) (Chronic)    Encouraged exercise, weight loss, healthy eating habits. Continue semaglutide 0.25 mg weekly with plan to increase to 0.5 mg weekly in the next few weeks.      Essential hypertension (Chronic)    Stable, chronic.  Continue current medication.   losartan HCTZ  50/12.5 mg daily      Relevant Medications   losartan-hydrochlorothiazide (HYZAAR) 50-12.5 MG tablet   Hypothyroidism (Chronic)    Chronic, well controlled continue levothyroxine 25 mcg p.o. daily      Relevant Medications   levothyroxine (SYNTHROID) 25 MCG tablet   MDD (recurrent major depressive disorder) in remission (HCC) (Chronic)    Chronic, stable control on no medication.        Eliezer Lofts, MD

## 2021-11-11 NOTE — Assessment & Plan Note (Signed)
Encouraged exercise, weight loss, healthy eating habits. Continue semaglutide 0.25 mg weekly with plan to increase to 0.5 mg weekly in the next few weeks.

## 2021-11-11 NOTE — Assessment & Plan Note (Signed)
Chronic, well controlled continue levothyroxine 25 mcg p.o. daily

## 2021-11-11 NOTE — Assessment & Plan Note (Signed)
Stable, chronic.  Continue current medication.   losartan HCTZ  50/12.5 mg daily

## 2021-12-10 ENCOUNTER — Other Ambulatory Visit (HOSPITAL_COMMUNITY): Payer: Self-pay

## 2021-12-16 ENCOUNTER — Other Ambulatory Visit: Payer: Self-pay | Admitting: Gastroenterology

## 2021-12-16 ENCOUNTER — Telehealth: Payer: Self-pay | Admitting: Gastroenterology

## 2021-12-16 DIAGNOSIS — K839 Disease of biliary tract, unspecified: Secondary | ICD-10-CM

## 2021-12-16 DIAGNOSIS — Z1211 Encounter for screening for malignant neoplasm of colon: Secondary | ICD-10-CM

## 2021-12-16 DIAGNOSIS — R748 Abnormal levels of other serum enzymes: Secondary | ICD-10-CM

## 2021-12-16 MED ORDER — POLYETHYLENE GLYCOL 3350 17 GM/SCOOP PO POWD: 1.0000 | Freq: Every day | ORAL | 3 refills | Status: DC

## 2021-12-16 NOTE — Telephone Encounter (Signed)
Inbound call from patient stating that she is scheduled to have a colonoscopy tomorrow morning at the hospital and she has no instructions. Patient is requesting a call back to discuss. Please advise.

## 2021-12-16 NOTE — Telephone Encounter (Signed)
I returned pt call and discussed the colon appt instructions.  I have sent the instructions to the pt My Chart.  Prep has also been sent.    Es this pt has a colon tomorrow and the referral was not entered. I entered it today.

## 2021-12-17 ENCOUNTER — Other Ambulatory Visit: Payer: Self-pay

## 2021-12-17 ENCOUNTER — Ambulatory Visit (HOSPITAL_COMMUNITY)
Admission: RE | Admit: 2021-12-17 | Discharge: 2021-12-17 | Disposition: A | Discharge status: Home / Self Care | Payer: 59 | Attending: Gastroenterology | Admitting: Gastroenterology

## 2021-12-17 ENCOUNTER — Ambulatory Visit (HOSPITAL_BASED_OUTPATIENT_CLINIC_OR_DEPARTMENT_OTHER): Payer: 59 | Admitting: Anesthesiology

## 2021-12-17 ENCOUNTER — Encounter (HOSPITAL_COMMUNITY)
Admission: RE | Disposition: A | Discharge status: Home / Self Care | Payer: Self-pay | Source: Home / Self Care | Attending: Gastroenterology

## 2021-12-17 ENCOUNTER — Ambulatory Visit (HOSPITAL_COMMUNITY): Payer: 59 | Admitting: Anesthesiology

## 2021-12-17 ENCOUNTER — Encounter (HOSPITAL_COMMUNITY): Payer: Self-pay | Admitting: Gastroenterology

## 2021-12-17 DIAGNOSIS — I10 Essential (primary) hypertension: Secondary | ICD-10-CM | POA: Insufficient documentation

## 2021-12-17 DIAGNOSIS — F17210 Nicotine dependence, cigarettes, uncomplicated: Secondary | ICD-10-CM

## 2021-12-17 DIAGNOSIS — K649 Unspecified hemorrhoids: Secondary | ICD-10-CM | POA: Diagnosis not present

## 2021-12-17 DIAGNOSIS — Z6841 Body Mass Index (BMI) 40.0 and over, adult: Secondary | ICD-10-CM | POA: Diagnosis not present

## 2021-12-17 DIAGNOSIS — R748 Abnormal levels of other serum enzymes: Secondary | ICD-10-CM

## 2021-12-17 DIAGNOSIS — Z1211 Encounter for screening for malignant neoplasm of colon: Secondary | ICD-10-CM

## 2021-12-17 DIAGNOSIS — M1990 Unspecified osteoarthritis, unspecified site: Secondary | ICD-10-CM | POA: Diagnosis not present

## 2021-12-17 DIAGNOSIS — K5730 Diverticulosis of large intestine without perforation or abscess without bleeding: Secondary | ICD-10-CM

## 2021-12-17 DIAGNOSIS — K635 Polyp of colon: Secondary | ICD-10-CM | POA: Insufficient documentation

## 2021-12-17 DIAGNOSIS — Z8249 Family history of ischemic heart disease and other diseases of the circulatory system: Secondary | ICD-10-CM | POA: Insufficient documentation

## 2021-12-17 DIAGNOSIS — Z8261 Family history of arthritis: Secondary | ICD-10-CM | POA: Diagnosis not present

## 2021-12-17 DIAGNOSIS — E119 Type 2 diabetes mellitus without complications: Secondary | ICD-10-CM | POA: Diagnosis not present

## 2021-12-17 DIAGNOSIS — Z79899 Other long term (current) drug therapy: Secondary | ICD-10-CM | POA: Insufficient documentation

## 2021-12-17 DIAGNOSIS — K640 First degree hemorrhoids: Secondary | ICD-10-CM

## 2021-12-17 DIAGNOSIS — D125 Benign neoplasm of sigmoid colon: Secondary | ICD-10-CM | POA: Diagnosis not present

## 2021-12-17 DIAGNOSIS — Z1212 Encounter for screening for malignant neoplasm of rectum: Secondary | ICD-10-CM

## 2021-12-17 DIAGNOSIS — K644 Residual hemorrhoidal skin tags: Secondary | ICD-10-CM | POA: Insufficient documentation

## 2021-12-17 DIAGNOSIS — K5790 Diverticulosis of intestine, part unspecified, without perforation or abscess without bleeding: Secondary | ICD-10-CM | POA: Diagnosis not present

## 2021-12-17 DIAGNOSIS — F17200 Nicotine dependence, unspecified, uncomplicated: Secondary | ICD-10-CM | POA: Diagnosis not present

## 2021-12-17 DIAGNOSIS — K839 Disease of biliary tract, unspecified: Secondary | ICD-10-CM

## 2021-12-17 DIAGNOSIS — Z8719 Personal history of other diseases of the digestive system: Secondary | ICD-10-CM | POA: Diagnosis not present

## 2021-12-17 HISTORY — PX: HEMOSTASIS CLIP PLACEMENT: SHX6857

## 2021-12-17 HISTORY — PX: COLONOSCOPY WITH PROPOFOL: SHX5780

## 2021-12-17 HISTORY — PX: POLYPECTOMY: SHX5525

## 2021-12-17 SURGERY — COLONOSCOPY WITH PROPOFOL
Anesthesia: Monitor Anesthesia Care

## 2021-12-17 MED ORDER — PROPOFOL 1000 MG/100ML IV EMUL
INTRAVENOUS | Status: AC
  Filled 2021-12-17: qty 100

## 2021-12-17 MED ORDER — PROPOFOL 500 MG/50ML IV EMUL
INTRAVENOUS | Status: AC
  Filled 2021-12-17: qty 50

## 2021-12-17 MED ORDER — LACTATED RINGERS IV SOLN
INTRAVENOUS | Status: DC
  Administered 2021-12-17: 1000 mL via INTRAVENOUS

## 2021-12-17 MED ORDER — PROPOFOL 500 MG/50ML IV EMUL
INTRAVENOUS | Status: DC | PRN
  Administered 2021-12-17: 130 ug/kg/min via INTRAVENOUS
  Administered 2021-12-17: 20 mg via INTRAVENOUS
  Administered 2021-12-17: 10 mg via INTRAVENOUS
  Administered 2021-12-17: 20 mg via INTRAVENOUS

## 2021-12-17 MED ORDER — SODIUM CHLORIDE 0.9 % IV SOLN: INTRAVENOUS | Status: DC

## 2021-12-17 MED ORDER — LIDOCAINE 2% (20 MG/ML) 5 ML SYRINGE
INTRAMUSCULAR | Status: DC | PRN
  Administered 2021-12-17: 40 mg via INTRAVENOUS

## 2021-12-17 SURGICAL SUPPLY — 22 items

## 2021-12-17 NOTE — Discharge Instructions (Signed)
YOU HAD AN ENDOSCOPIC PROCEDURE TODAY: Refer to the procedure report and other information in the discharge instructions given to you for any specific questions about what was found during the examination. If this information does not answer your questions, please call [place] office at [phone] to clarify.  ° °YOU SHOULD EXPECT: Some feelings of bloating in the abdomen. Passage of more gas than usual. Walking can help get rid of the air that was put into your GI tract during the procedure and reduce the bloating. If you had a lower endoscopy (such as a colonoscopy or flexible sigmoidoscopy) you may notice spotting of blood in your stool or on the toilet paper. Some abdominal soreness may be present for a day or two, also. ° °DIET: Your first meal following the procedure should be a light meal and then it is ok to progress to your normal diet. A half-sandwich or bowl of soup is an example of a good first meal. Heavy or fried foods are harder to digest and may make you feel nauseous or bloated. Drink plenty of fluids but you should avoid alcoholic beverages for 24 hours. If you had a esophageal dilation, please see attached instructions for diet.   ° °ACTIVITY: Your care partner should take you home directly after the procedure. You should plan to take it easy, moving slowly for the rest of the day. You can resume normal activity the day after the procedure however YOU SHOULD NOT DRIVE, use power tools, machinery or perform tasks that involve climbing or major physical exertion for 24 hours (because of the sedation medicines used during the test).  ° °SYMPTOMS TO REPORT IMMEDIATELY: °A gastroenterologist can be reached at any hour. Please call [phone]  for any of the following symptoms:  °Following lower endoscopy (colonoscopy, flexible sigmoidoscopy) °Excessive amounts of blood in the stool  °Significant tenderness, worsening of abdominal pains  °Swelling of the abdomen that is new, acute  °Fever of 100° or  higher  °Following upper endoscopy (EGD, EUS, ERCP, esophageal dilation) °Vomiting of blood or coffee ground material  °New, significant abdominal pain  °New, significant chest pain or pain under the shoulder blades  °Painful or persistently difficult swallowing  °New shortness of breath  °Black, tarry-looking or red, bloody stools ° °FOLLOW UP:  °If any biopsies were taken you will be contacted by phone or by letter within the next 1-3 weeks. Call [phone]  if you have not heard about the biopsies in 3 weeks.  °Please also call with any specific questions about appointments or follow up tests. ° °

## 2021-12-17 NOTE — Anesthesia Preprocedure Evaluation (Signed)
Anesthesia Evaluation  Patient identified by MRN, date of birth, ID band Patient awake    Reviewed: Allergy & Precautions, NPO status , Patient's Chart, lab work & pertinent test results  History of Anesthesia Complications Negative for: history of anesthetic complications  Airway Mallampati: IV  TM Distance: <3 FB Neck ROM: Full    Dental  (+) Dental Advisory Given   Pulmonary neg COPD, Current Smoker and Patient abstained from smoking.,    breath sounds clear to auscultation       Cardiovascular hypertension, Pt. on medications (-) angina(-) Past MI and (-) CHF  Rhythm:Regular     Neuro/Psych  Headaches, PSYCHIATRIC DISORDERS Depression    GI/Hepatic Neg liver ROS, GERD  ,  Endo/Other  diabetesHypothyroidism Morbid obesity  Renal/GU negative Renal ROS     Musculoskeletal  (+) Arthritis ,   Abdominal   Peds  Hematology negative hematology ROS (+) Lab Results      Component                Value               Date                      WBC                      16.8 (H)            03/27/2021                HGB                      12.8                03/27/2021                HCT                      41.3                03/27/2021                MCV                      93.7                03/27/2021                PLT                      347                 03/27/2021              Anesthesia Other Findings   Reproductive/Obstetrics                             Anesthesia Physical Anesthesia Plan  ASA: 3  Anesthesia Plan: MAC   Post-op Pain Management: Minimal or no pain anticipated   Induction: Intravenous  PONV Risk Score and Plan: 1 and Propofol infusion and Treatment may vary due to age or medical condition  Airway Management Planned: Nasal Cannula and Natural Airway  Additional Equipment: None  Intra-op Plan:   Post-operative Plan:   Informed Consent: I have reviewed  the patients History and Physical, chart, labs and discussed the procedure  including the risks, benefits and alternatives for the proposed anesthesia with the patient or authorized representative who has indicated his/her understanding and acceptance.     Dental advisory given  Plan Discussed with: CRNA  Anesthesia Plan Comments:         Anesthesia Quick Evaluation

## 2021-12-17 NOTE — Anesthesia Postprocedure Evaluation (Signed)
Anesthesia Post Note  Patient: Jamie Mckenzie  Procedure(s) Performed: COLONOSCOPY WITH PROPOFOL POLYPECTOMY     Patient location during evaluation: Endoscopy Anesthesia Type: MAC Level of consciousness: awake and alert Pain management: pain level controlled Vital Signs Assessment: post-procedure vital signs reviewed and stable Respiratory status: spontaneous breathing, nonlabored ventilation and respiratory function stable Cardiovascular status: stable and blood pressure returned to baseline Postop Assessment: no apparent nausea or vomiting Anesthetic complications: no   No notable events documented.  Last Vitals:  Vitals:   12/17/21 0833 12/17/21 0843  BP: 130/66 (!) 144/81  Pulse: 76 82  Resp: 12 11  Temp:    SpO2: 97% 97%    Last Pain:  Vitals:   12/17/21 0843  TempSrc:   PainSc: 0-No pain                 Kevaughn Ewing

## 2021-12-17 NOTE — H&P (Signed)
GASTROENTEROLOGY PROCEDURE H&P NOTE   Primary Care Physician: Earna Coder, NP  HPI: Jamie Mckenzie is a 51 y.o. female who presents for Colonoscopy for screening.  Past Medical History:  Diagnosis Date   Abrasion of right wrist 04/15/2012   Arthritis    ankles   Cholecystitis, acute 02/2021   Cough 04/15/2012   Dental crowns present    GERD (gastroesophageal reflux disease)    Hypertension    Hypothyroidism    Morbid obesity ([place])    Perimenopausal 03/26/2012   irregular periods   Retained orthopedic hardware 03/26/2012   left ankle   Stuffy and runny nose    clear drainage from nose - URI onset 03/30/2012   Past Surgical History:  Procedure Laterality Date   BILIARY STENT PLACEMENT  03/26/2021   Procedure: BILIARY STENT PLACEMENT;  Surgeon: [name]., MD;  Location: [facility];  Service: Gastroenterology;;   BILIARY STENT PLACEMENT N/A 07/16/2021   Procedure: BILIARY STENT REMOVAL;  Surgeon: [name]., MD;  Location: [facility];  Service: Gastroenterology;  Laterality: N/A;   BIOPSY  07/16/2021   Procedure: BIOPSY;  Surgeon: [name]., MD;  Location: [facility];  Service: Gastroenterology;;   BREAST BIOPSY Left 10/23/2014   Benign   CHOLECYSTECTOMY N/A 03/17/2021   Procedure: LAPAROSCOPIC CHOLECYSTECTOMY WITH ICG;  Surgeon: Rolm Bookbinder, MD;  Location: [place];  Service: General;  Laterality: N/A;   DILATION AND EVACUATION  11/15/2008   ENDOSCOPIC RETROGRADE CHOLANGIOPANCREATOGRAPHY (ERCP) WITH PROPOFOL N/A 03/26/2021   Procedure: ENDOSCOPIC RETROGRADE CHOLANGIOPANCREATOGRAPHY (ERCP) WITH PROPOFOL;  Surgeon: [name]., MD;  Location: [facility];  Service: Gastroenterology;  Laterality: N/A;   ENDOSCOPIC RETROGRADE CHOLANGIOPANCREATOGRAPHY (ERCP) WITH PROPOFOL N/A 07/16/2021   Procedure: ENDOSCOPIC RETROGRADE CHOLANGIOPANCREATOGRAPHY (ERCP) WITH PROPOFOL;  Surgeon: Rush Landmark Telford Nab., MD;  Location:  [facility];  Service: Gastroenterology;  Laterality: N/A;   HARDWARE REMOVAL Left 04/21/2012   Procedure: HARDWARE REMOVAL;  Surgeon: Wylene Simmer, MD;  Location: [facility];  Service: Orthopedics;  Laterality: Left;  LEFT ANKLE HARDWARE REMOVAL   HYSTEROSCOPY WITH D & C  10/24/2009   with polypectomy   MULTIPLE TOOTH EXTRACTIONS     ORIF ANKLE FRACTURE Right 1985   ORIF ANKLE FRACTURE  11/03/2011   Procedure: OPEN REDUCTION INTERNAL FIXATION (ORIF) ANKLE FRACTURE;  Surgeon: Wylene Simmer, MD;  Location: [place];  Service: Orthopedics;  Laterality: Left;  Open Reduction Internal Fixation left ankle lateral malleolus fracture, left syndesmosis   REMOVAL OF STONES  03/26/2021   Procedure: REMOVAL OF STONES;  Surgeon: Rush Landmark Telford Nab., MD;  Location: [place];  Service: Gastroenterology;;   REMOVAL OF STONES  07/16/2021   Procedure: REMOVAL OF SLUDGE;  Surgeon: [name]., MD;  Location: [facility];  Service: Gastroenterology;;   Joan Mayans  03/26/2021   Procedure: Joan Mayans;  Surgeon: Mansouraty, Telford Nab., MD;  Location: [place];  Service: Gastroenterology;;   Current Facility-Administered Medications  Medication Dose Route Frequency Provider Last Rate Last Admin   0.9 %  sodium chloride infusion   Intravenous Continuous Mansouraty, Telford Nab., MD       lactated ringers infusion   Intravenous Continuous Mansouraty, Telford Nab., MD 10 mL/hr at 12/17/21 0705 Continued from Pre-op at 12/17/21 0705    Current Facility-Administered Medications:    0.9 %  sodium chloride infusion, , Intravenous, Continuous, Mansouraty, Telford Nab., MD   lactated ringers infusion, , Intravenous, Continuous, Mansouraty, Telford Nab., MD, Last Rate: 10 mL/hr at  12/17/21 0705, Continued from Pre-op at 12/17/21 0705 No Known Allergies Family History  Problem Relation Age of Onset   Hypertension Mother    Arthritis Mother    Obesity Mother         morbid   Myasthenia gravis Mother    Cancer Father        non hodgkins lymphoma   Breast cancer Maternal Aunt    Cancer Maternal Grandmother        lung   Heart disease Maternal Grandfather        heart attack   COPD Paternal Grandfather    Colon cancer Neg Hx    Esophageal cancer Neg Hx    Inflammatory bowel disease Neg Hx    Liver disease Neg Hx    Pancreatic cancer Neg Hx    Rectal cancer Neg Hx    Stomach cancer Neg Hx    Social History   Socioeconomic History   Marital status: Single    Spouse name: Not on file   Number of children: 0   Years of education: Not on file   Highest education level: Not on file  Occupational History   Occupation: Surveyor, quantity: [employer]  Tobacco Use   Smoking status: Light Smoker    Years: 10.00    Types: Cigarettes   Smokeless tobacco: Never   Tobacco comments:    2-3 cig./day  Vaping Use   Vaping Use: Never used  Substance and Sexual Activity   Alcohol use: Yes    Comment: socially   Drug use: No   Sexual activity: Not on file  Other Topics Concern   Not on file  Social History Narrative   Not on file   Social Determinants of Health   Financial Resource Strain: Not on file  Food Insecurity: No Food Insecurity (01/23/2020)   Hunger Vital Sign    Worried About Running Out of Food in the Last Year: Never true    Ran Out of Food in the Last Year: Never true  Transportation Needs: Not on file  Physical Activity: Not on file  Stress: Not on file  Social Connections: Not on file  Intimate Partner Violence: Not on file    Physical Exam: Today's Vitals   12/17/21 0649  BP: (!) 147/80  Pulse: 89  Resp: 16  Temp: 98.2 F (36.8 C)  TempSrc: Tympanic  SpO2: 97%  Weight: (!) 140.8 kg  Height: '5\' 4"'$  (1.626 m)  PainSc: 0-No pain   Body mass index is 53.28 kg/m. GEN: NAD EYE: Sclerae anicteric ENT: MMM CV: Non-tachycardic GI: Soft, NT/ND NEURO:  Alert & Oriented x 3  Lab Results: No  results for input(s): "WBC", "HGB", "HCT", "PLT" in the last 72 hours. BMET No results for input(s): "NA", "K", "CL", "CO2", "GLUCOSE", "BUN", "CREATININE", "CALCIUM" in the last 72 hours. LFT No results for input(s): "PROT", "ALBUMIN", "AST", "ALT", "ALKPHOS", "BILITOT", "BILIDIR", "IBILI" in the last 72 hours. PT/INR No results for input(s): "LABPROT", "INR" in the last 72 hours.   Impression / Plan: This is a 51 y.o.female who presents for Colonoscopy for screening.  The risks and benefits of endoscopic evaluation/treatment were discussed with the patient and/or family; these include but are not limited to the risk of perforation, infection, bleeding, missed lesions, lack of diagnosis, severe illness requiring hospitalization, as well as anesthesia and sedation related illnesses.  The patient's history has been reviewed, patient examined, no change in status, and deemed stable  for procedure.  The patient and/or family is agreeable to proceed.    Justice Britain, MD [facility] Advanced [facility]oscopy Office # [phone]

## 2021-12-17 NOTE — Transfer of Care (Signed)
Immediate Anesthesia Transfer of Care Note  Patient: Jamie Mckenzie [facility]  Procedure(s) Performed: COLONOSCOPY WITH PROPOFOL POLYPECTOMY  Patient Location: PACU and Endoscopy Unit  Anesthesia Type:MAC  Level of Consciousness: awake, alert , oriented and patient cooperative  Airway & Oxygen Therapy: Patient Spontanous Breathing and Patient connected to face mask oxygen  Post-op Assessment: Report given to RN, Post -op Vital signs reviewed and stable and Patient moving all extremities  Post vital signs: Reviewed and stable  Last Vitals:  Vitals Value Taken Time  BP 136/60 12/17/21 0823  Temp 36.7 C 12/17/21 0823  Pulse 87 12/17/21 0825  Resp 16 12/17/21 0825  SpO2 100 % 12/17/21 0825  Vitals shown include unvalidated device data.  Last Pain:  Vitals:   12/17/21 0823  TempSrc: Tympanic  PainSc: Asleep         Complications: No notable events documented.

## 2021-12-17 NOTE — Op Note (Addendum)
[facility] Patient Name: Jamie Mckenzie Procedure Date: 12/17/2021 MRN: [id number] Attending MD: Justice Britain , MD, [id number] Date of Birth: [birth year] CSN: [id number] Age: 51 Admit Type: Outpatient Procedure:                Colonoscopy Indications:              Screening for colorectal malignant neoplasm, This                            is the patient's first colonoscopy Providers:                Justice Britain, MD, Nadean Corwin, Technician, Brien Mates, RNFA, Victoriano Lain, CRNA Referring MD:             Heber Aspermont Medicines:                Monitored Anesthesia Care Complications:            No immediate complications. Estimated Blood Loss:     Estimated blood loss was minimal. Procedure:                Pre-Anesthesia Assessment:                           - Prior to the procedure, a History and Physical                            was performed, and patient medications and                            allergies were reviewed. The patient's tolerance of                            previous anesthesia was also reviewed. The risks                            and benefits of the procedure and the sedation                            options and risks were discussed with the patient.                            All questions were answered, and informed consent                            was obtained. Prior Anticoagulants: The patient has                            taken no anticoagulant or antiplatelet agents                            except  for NSAID medication. ASA Grade Assessment:                            III - A patient with severe systemic disease. After                            reviewing the risks and benefits, the patient was                            deemed in satisfactory condition to undergo the                            procedure.                           After  obtaining informed consent, the colonoscope                            was passed under direct vision. Throughout the                            procedure, the patient's blood pressure, pulse, and                            oxygen saturations were monitored continuously. The                            [id number]-HQ190L ([id number]) Olympus colonoscope was                            introduced through the anus and advanced to the the                            cecum, identified by appendiceal orifice and                            ileocecal valve. The colonoscopy was performed                            without difficulty. The patient tolerated the                            procedure. The quality of the bowel preparation was                            adequate. The ileocecal valve, appendiceal orifice,                            and rectum were photographed. Scope In: 7:55:58 AM Scope Out: 8:17:11 AM Scope Withdrawal Time: 0 hours 15 minutes 44 seconds  Total Procedure Duration: 0 hours 21 minutes 13 seconds  Findings:      The digital rectal exam findings include hemorrhoids. Pertinent       negatives include no palpable rectal lesions.      The colon (  entire examined portion) revealed moderately excessive       looping.      A 15 mm polyp was found in the sigmoid colon. The polyp was       semi-pedunculated. The polyp was removed with a cold snare. Resection       and retrieval were complete. To prevent bleeding after the polypectomy,       one hemostatic clip was successfully placed (MR conditional). Clip       manufacturer: [employer]. There was no bleeding at the end of the       procedure.      A few small-mouthed diverticula were found in the recto-sigmoid colon,       sigmoid colon and descending colon.      Normal mucosa was found in the entire colon otherwise.      Non-bleeding non-thrombosed external and internal hemorrhoids were found       during retroflexion, during perianal  exam and during digital exam. The       hemorrhoids were Grade I (internal hemorrhoids that do not prolapse). Impression:               - Hemorrhoids found on digital rectal exam.                           - There was significant looping of the colon.                           - One 15 mm polyp in the sigmoid colon, removed                            with a cold snare. Resected and retrieved. Clip (MR                            conditional) was placed. Clip manufacturer: [employer].                           - Diverticulosis in the recto-sigmoid colon, in the                            sigmoid colon and in the descending colon.                           - Normal mucosa in the entire examined colon                            otherwise.                           - Non-bleeding non-thrombosed external and internal                            hemorrhoids. Moderate Sedation:      Not Applicable - Patient had care per Anesthesia. Recommendation:           - The patient will be observed  post-procedure,                            until all discharge criteria are met.                           - Discharge patient to home.                           - Patient has a contact number available for                            emergencies. The signs and symptoms of potential                            delayed complications were discussed with the                            patient. Return to normal activities tomorrow.                            Written discharge instructions were provided to the                            patient.                           - High fiber diet.                           - Use FiberCon 1-2 tablets PO daily.                           - Continue present medications.                           - Await pathology results.                           - Repeat colonoscopy in 3 years for surveillance                            based on pathology  results if adenomatous tissue is                            found.                           - The findings and recommendations were discussed                            with the patient.                           - The findings and recommendations were discussed  with the designated responsible adult. Procedure Code(s):        --- Professional ---                           [phone], Colonoscopy, flexible; with removal of                            tumor(s), polyp(s), or other lesion(s) by snare                            technique Diagnosis Code(s):        --- Professional ---                           Z12.11, Encounter for screening for malignant                            neoplasm of colon                           K64.0, First degree hemorrhoids                           D12.5, Benign neoplasm of sigmoid colon                           K57.30, Diverticulosis of large intestine without                            perforation or abscess without bleeding CPT copyright 2022 American [employer] Association. All rights reserved. The codes documented in this report are preliminary and upon coder review may  be revised to meet current compliance requirements. Justice Britain, MD 12/17/2021 8:24:18 AM Number of Addenda: 0

## 2021-12-18 ENCOUNTER — Encounter: Payer: Self-pay | Admitting: Gastroenterology

## 2021-12-18 LAB — SURGICAL PATHOLOGY

## 2021-12-19 ENCOUNTER — Encounter (HOSPITAL_COMMUNITY): Payer: Self-pay | Admitting: Gastroenterology

## 2022-01-20 ENCOUNTER — Other Ambulatory Visit (HOSPITAL_COMMUNITY): Payer: Self-pay

## 2022-01-20 DIAGNOSIS — B352 Tinea manuum: Secondary | ICD-10-CM | POA: Diagnosis not present

## 2022-01-20 DIAGNOSIS — Z136 Encounter for screening for cardiovascular disorders: Secondary | ICD-10-CM | POA: Diagnosis not present

## 2022-01-20 DIAGNOSIS — E039 Hypothyroidism, unspecified: Secondary | ICD-10-CM | POA: Diagnosis not present

## 2022-01-20 DIAGNOSIS — Z720 Tobacco use: Secondary | ICD-10-CM | POA: Diagnosis not present

## 2022-01-20 DIAGNOSIS — Z713 Dietary counseling and surveillance: Secondary | ICD-10-CM | POA: Diagnosis not present

## 2022-01-20 DIAGNOSIS — Z6841 Body Mass Index (BMI) 40.0 and over, adult: Secondary | ICD-10-CM | POA: Diagnosis not present

## 2022-01-20 DIAGNOSIS — Z7182 Exercise counseling: Secondary | ICD-10-CM | POA: Diagnosis not present

## 2022-01-20 DIAGNOSIS — E8881 Metabolic syndrome: Secondary | ICD-10-CM | POA: Diagnosis not present

## 2022-01-20 DIAGNOSIS — I10 Essential (primary) hypertension: Secondary | ICD-10-CM | POA: Diagnosis not present

## 2022-01-20 MED ORDER — WEGOVY 1 MG/0.5ML ~~LOC~~ SOAJ
1.0000 mg | SUBCUTANEOUS | 0 refills | Status: DC
  Filled 2022-01-20: qty 2, 28d supply, fill #0

## 2022-01-20 MED ORDER — TERBINAFINE HCL 1 % EX CREA
TOPICAL_CREAM | Freq: Two times a day (BID) | CUTANEOUS | 3 refills | Status: DC
  Filled 2022-01-20: qty 12, 30d supply, fill #0

## 2022-01-22 ENCOUNTER — Other Ambulatory Visit (HOSPITAL_COMMUNITY): Payer: Self-pay

## 2022-06-05 ENCOUNTER — Other Ambulatory Visit: Payer: Self-pay

## 2022-06-05 ENCOUNTER — Emergency Department (HOSPITAL_BASED_OUTPATIENT_CLINIC_OR_DEPARTMENT_OTHER): Payer: Managed Care, Other (non HMO)

## 2022-06-05 ENCOUNTER — Encounter (HOSPITAL_BASED_OUTPATIENT_CLINIC_OR_DEPARTMENT_OTHER): Payer: Self-pay

## 2022-06-05 ENCOUNTER — Emergency Department (HOSPITAL_BASED_OUTPATIENT_CLINIC_OR_DEPARTMENT_OTHER)
Admission: EM | Admit: 2022-06-05 | Discharge: 2022-06-05 | Disposition: A | Discharge status: Home / Self Care | Payer: Managed Care, Other (non HMO) | Attending: Emergency Medicine | Admitting: Emergency Medicine

## 2022-06-05 DIAGNOSIS — R109 Unspecified abdominal pain: Secondary | ICD-10-CM

## 2022-06-05 DIAGNOSIS — R000 Tachycardia, unspecified: Secondary | ICD-10-CM | POA: Diagnosis not present

## 2022-06-05 DIAGNOSIS — E039 Hypothyroidism, unspecified: Secondary | ICD-10-CM | POA: Diagnosis not present

## 2022-06-05 DIAGNOSIS — Z79899 Other long term (current) drug therapy: Secondary | ICD-10-CM | POA: Insufficient documentation

## 2022-06-05 DIAGNOSIS — M25511 Pain in right shoulder: Secondary | ICD-10-CM | POA: Diagnosis not present

## 2022-06-05 DIAGNOSIS — I10 Essential (primary) hypertension: Secondary | ICD-10-CM | POA: Insufficient documentation

## 2022-06-05 LAB — TROPONIN I (HIGH SENSITIVITY): Troponin I (High Sensitivity): 3 ng/L (ref <18)

## 2022-06-05 LAB — COMPREHENSIVE METABOLIC PANEL
ALT: 23 U/L (ref 0–44)
AST: 21 U/L (ref 15–41)
Albumin: 3.8 g/dL (ref 3.5–5.0)
Alkaline Phosphatase: 137 U/L — ABNORMAL HIGH (ref 38–126)
Anion gap: 10 (ref 5–15)
BUN: 14 mg/dL (ref 6–20)
CO2: 26 mmol/L (ref 22–32)
Calcium: 9.2 mg/dL (ref 8.9–10.3)
Chloride: 100 mmol/L (ref 98–111)
Creatinine, Ser: 0.8 mg/dL (ref 0.44–1.00)
GFR, Estimated: >60 mL/min (ref >60)
Glucose, Bld: 101 mg/dL — ABNORMAL HIGH (ref 70–99)
Potassium: 4.1 mmol/L (ref 3.5–5.1)
Sodium: 136 mmol/L (ref 135–145)
Total Bilirubin: 0.7 mg/dL (ref 0.3–1.2)
Total Protein: 7.5 g/dL (ref 6.5–8.1)

## 2022-06-05 LAB — CBC WITH DIFFERENTIAL/PLATELET
Abs Immature Granulocytes: 0.05 10*3/uL (ref 0.00–0.07)
Basophils Absolute: 0 10*3/uL (ref 0.0–0.1)
Basophils Relative: 0 %
Eosinophils Absolute: 0 10*3/uL (ref 0.0–0.5)
Eosinophils Relative: 0 %
HCT: 43.6 % (ref 36.0–46.0)
Hemoglobin: 14 g/dL (ref 12.0–15.0)
Immature Granulocytes: 0 %
Lymphocytes Relative: 15 %
Lymphs Abs: 2 10*3/uL (ref 0.7–4.0)
MCH: 28.3 pg (ref 26.0–34.0)
MCHC: 32.1 g/dL (ref 30.0–36.0)
MCV: 88.1 fL (ref 80.0–100.0)
Monocytes Absolute: 0.5 10*3/uL (ref 0.1–1.0)
Monocytes Relative: 4 %
Neutro Abs: 10.6 10*3/uL — ABNORMAL HIGH (ref 1.7–7.7)
Neutrophils Relative %: 81 %
Platelets: 356 10*3/uL (ref 150–400)
RBC: 4.95 MIL/uL (ref 3.87–5.11)
RDW: 14.2 % (ref 11.5–15.5)
WBC: 13.2 10*3/uL — ABNORMAL HIGH (ref 4.0–10.5)
nRBC: 0 % (ref 0.0–0.2)

## 2022-06-05 LAB — URINALYSIS, ROUTINE W REFLEX MICROSCOPIC
Bilirubin Urine: NEGATIVE
Glucose, UA: NEGATIVE mg/dL
Hgb urine dipstick: NEGATIVE
Ketones, ur: NEGATIVE mg/dL
Leukocytes,Ua: NEGATIVE
Nitrite: NEGATIVE
Protein, ur: NEGATIVE mg/dL
Specific Gravity, Urine: 1.015 (ref 1.005–1.030)
pH: 6.5 (ref 5.0–8.0)

## 2022-06-05 LAB — LIPASE, BLOOD: Lipase: 23 U/L (ref 11–51)

## 2022-06-05 LAB — D-DIMER, QUANTITATIVE: D-Dimer, Quant: <0.27 ug/mL-FEU (ref 0.00–0.50)

## 2022-06-05 MED ORDER — MORPHINE SULFATE (PF) 4 MG/ML IV SOLN
4.0000 mg | Freq: Once | INTRAVENOUS | Status: AC
  Administered 2022-06-05: 4 mg via INTRAVENOUS
  Filled 2022-06-05: qty 1

## 2022-06-05 MED ORDER — KETOROLAC TROMETHAMINE 15 MG/ML IJ SOLN
15.0000 mg | Freq: Once | INTRAMUSCULAR | Status: AC
  Administered 2022-06-05: 15 mg via INTRAVENOUS
  Filled 2022-06-05: qty 1

## 2022-06-05 MED ORDER — CYCLOBENZAPRINE HCL 10 MG PO TABS: 10.0000 mg | ORAL_TABLET | Freq: Two times a day (BID) | ORAL | 0 refills | Status: DC | PRN

## 2022-06-05 MED ORDER — ONDANSETRON HCL 4 MG/2ML IJ SOLN
4.0000 mg | Freq: Once | INTRAMUSCULAR | Status: AC
  Administered 2022-06-05: 4 mg via INTRAVENOUS
  Filled 2022-06-05: qty 2

## 2022-06-05 MED ORDER — SODIUM CHLORIDE 0.9 % IV BOLUS
1000.0000 mL | Freq: Once | INTRAVENOUS | Status: AC
  Administered 2022-06-05: 1000 mL via INTRAVENOUS

## 2022-06-05 NOTE — ED Triage Notes (Signed)
Right sided flank pain going on 2 days.

## 2022-06-05 NOTE — ED Provider Notes (Signed)
[facility] Provider Note   CSN: [id number] Arrival date & time: 06/05/22  0344     History  Chief Complaint  Patient presents with   Flank Pain    Jamie Mckenzie is a 52 y.o. female.  The history is provided by the patient and medical records.  Flank Pain  Jamie Mckenzie is a 52 y.o. female who presents to the Emergency Department complaining of flank pain.  She presents to the emergency department for evaluation of right flank pain that started about 12 hours ago.  Pain is located in the right flank and radiates to the right shoulder.  It feels similar to prior gallbladder pain.  No associated fevers.  She does have nausea.  No dysuria, diarrhea, vomiting.  No difficulty breathing.  No recent surgeries.  No history of DVT/PE.  No exogenous estrogen.  She has a history of hypertension, cholecystectomy, hypothyroidism.     Home Medications Prior to Admission medications   Medication Sig Start Date End Date Taking? Authorizing Provider  cyclobenzaprine (FLEXERIL) 10 MG tablet Take 1 tablet (10 mg total) by mouth 2 (two) times daily as needed for muscle spasms. 06/05/22  Yes Tilden Fossa, MD  acetaminophen (TYLENOL) 500 MG tablet Take 1,000 mg by mouth every 6 (six) hours as needed for mild pain.    [provider]  albuterol (VENTOLIN HFA) 108 (90 Base) MCG/ACT inhaler Inhale 2 puffs into the lungs every 6 (six) hours as needed for wheezing or shortness of breath.    [provider]  augmented betamethasone dipropionate (DIPROLENE-AF) 0.05 % ointment Apply 1 Application topically daily as needed (eczema). 08/12/21   [provider]  doxycycline (VIBRA-TABS) 100 MG tablet Take 1 tablet (100 mg total) by mouth 2 (two) times daily. Patient not taking: Reported on 12/11/2021 11/11/21   Excell Seltzer, MD  ELDERBERRY PO Take 1 tablet by mouth daily as needed (immune support).    [provider]   GAVILAX 17 GM/SCOOP powder TAKE 238 G BY MOUTH DAILY. 12/16/21   Mansouraty, Netty Starring., MD  ibuprofen (ADVIL) 200 MG tablet Take 400-800 mg by mouth every 6 (six) hours as needed for headache or mild pain.    [provider]  levothyroxine (SYNTHROID) 25 MCG tablet Take 1 tablet (25 mcg total) by mouth daily. 11/11/21   Bedsole, Amy E, MD  losartan-hydrochlorothiazide (HYZAAR) 50-12.5 MG tablet Take 1 tablet by mouth daily. 11/11/21   Bedsole, Amy E, MD  Multiple Vitamins-Minerals (MULTI FOR HER PO) Take 1 tablet by mouth daily.    [provider]  omeprazole (PRILOSEC) 20 MG capsule Take 1 capsule (20 mg total) by mouth daily. 05/21/16   Bedsole, Amy E, MD  Semaglutide,0.25 or 0.5MG /DOS, (OZEMPIC, 0.25 OR 0.5 MG/DOSE,) 2 MG/1.5ML SOPN Inject 0.5 mg into the skin every Tuesday.    [provider]  Semaglutide-Weight Management (WEGOVY) 1 MG/0.5ML SOAJ Inject 1 mg into the skin once a week 01/20/22     terbinafine (LAMISIL) 1 % cream Apply topically 2 (two) times daily 01/20/22         Allergies    Patient has no known allergies.    Review of Systems   Review of Systems  Genitourinary:  Positive for flank pain.  All other systems reviewed and are negative.   Physical Exam Updated Vital Signs BP (!) 143/76   Pulse 92   Temp 98.2 F (36.8 C)   Resp 20  Ht  (1.626 m)   Wt (!) 140.6 kg   SpO2 93%   BMI 53.21 kg/m  Physical Exam Vitals and nursing note reviewed.  Constitutional:      Appearance: She is well-developed.  HENT:     Head: Normocephalic and atraumatic.  Cardiovascular:     Rate and Rhythm: Regular rhythm. Tachycardia present.     Heart sounds: No murmur heard. Pulmonary:     Effort: Pulmonary effort is normal. No respiratory distress.  Abdominal:     Palpations: Abdomen is soft.     Tenderness: There is no abdominal tenderness. There is no right CVA tenderness, left CVA tenderness, guarding or rebound.  Musculoskeletal:         General: No tenderness.  Skin:    General: Skin is warm and dry.  Neurological:     Mental Status: She is alert and oriented to person, place, and time.  Psychiatric:        Behavior: Behavior normal.     ED Results / Procedures / Treatments   Labs (all labs ordered are listed, but only abnormal results are displayed) Labs Reviewed  COMPREHENSIVE METABOLIC PANEL - Abnormal; Notable for the following components:      Result Value   Glucose, Bld 101 (*)    Alkaline Phosphatase 137 (*)    All other components within normal limits  CBC WITH DIFFERENTIAL/PLATELET - Abnormal; Notable for the following components:   WBC 13.2 (*)    Neutro Abs 10.6 (*)    All other components within normal limits  URINALYSIS, ROUTINE W REFLEX MICROSCOPIC  LIPASE, BLOOD  D-DIMER, QUANTITATIVE  TROPONIN I (HIGH SENSITIVITY)    EKG EKG Interpretation  Date/Time:  Friday June 05 2022 05:02:08 EDT Ventricular Rate:  69 PR Interval:  144 QRS Duration: 91 QT Interval:  380 QTC Calculation: 408 R Axis:   98 Text Interpretation: Sinus rhythm Borderline right axis deviation Low voltage, extremity and precordial leads Confirmed by Tilden Fossa [phone]) on 06/05/2022 5:18:45 AM  Radiology CT Renal Stone Study  Result Date: 06/05/2022 CLINICAL DATA:  Abdominal/flank pain.  Evaluate for kidney stone. EXAM: CT ABDOMEN AND PELVIS WITHOUT CONTRAST TECHNIQUE: Multidetector CT imaging of the abdomen and pelvis was performed following the standard protocol without IV contrast. RADIATION DOSE REDUCTION: This exam was performed according to the departmental dose-optimization program which includes automated exposure control, adjustment of the mA and/or kV according to patient size and/or use of iterative reconstruction technique. COMPARISON:  03/26/2021 FINDINGS: Lower chest: No acute findings. Hepatobiliary: Hepatic steatosis. Contour the liver is slightly irregular and there is hypertrophy of the caudate lobe and  lateral segment of left hepatic lobe. No focal liver abnormality noted. Status post cholecystectomy. Surgical clips and gallbladder remnant noted within the gallbladder fossa. No signs of intrahepatic bile duct dilatation. The CBD measures 8 mm. No calcified common bile duct stones identified. Pancreas: Unremarkable. No pancreatic ductal dilatation or surrounding inflammatory changes. Spleen: Normal in size without focal abnormality. Adrenals/Urinary Tract: Normal adrenal glands. No nephrolithiasis or hydronephrosis. Right kidney cyst measures 5 cm, image 36/2. No follow-up imaging recommended. Urinary bladder is unremarkable. Stomach/Bowel: Stomach appears within normal limits. No bowel wall thickening, inflammation, or distension. Sigmoid diverticula without signs of acute diverticulitis. Vascular/Lymphatic: Mild aortic atherosclerotic calcifications. No signs abdominopelvic adenopathy. Reproductive: Uterus and bilateral adnexa are unremarkable. Other: No free fluid or fluid collections. No signs of pneumoperitoneum. Musculoskeletal: No acute or significant osseous findings. IMPRESSION: 1. No acute findings within the abdomen  or pelvis. 2. No nephrolithiasis or hydronephrosis. 3. Hepatic steatosis with morphologic features of the liver suggestive of early cirrhosis. 4. Status post cholecystectomy. 5. Aortic Atherosclerosis (ICD10-I70.0). Electronically Signed   By: Signa Kell M.D.   On: 06/05/2022 06:11    Procedures Procedures    Medications Ordered in ED Medications  morphine (PF) 4 MG/ML injection 4 mg (4 mg Intravenous Given 06/05/22 0405)  ondansetron (ZOFRAN) injection 4 mg (4 mg Intravenous Given 06/05/22 0405)  sodium chloride 0.9 % bolus 1,000 mL (0 mLs Intravenous Stopped 06/05/22 0531)  ketorolac (TORADOL) 15 MG/ML injection 15 mg (15 mg Intravenous Given 06/05/22 0523)    ED Course/ Medical Decision Making/ A&P                             Medical Decision Making Amount and/or  Complexity of Data Reviewed Labs: ordered. Radiology: ordered.  Risk Prescription drug management.   Patient with history of hypertension, cholecystectomy, hypothyroidism here for evaluation of right flank pain for 12 hours.  UA is not consistent with UTI.  Patient without respiratory symptoms but did develop hypoxia after morphine administration and a D-dimer was obtained.  D-dimer is negative.  Hypoxia did resolve after morphine metabolized.  CMP with stable elevation in alk phos, otherwise unremarkable.  CBC with mild leukocytosis.  A CT stone study was obtained, which is negative for obstructing stone.  Discussed incidental findings of atherosclerosis and hepatic steatosis.  Presentation is not consistent with dissection, ACS, pneumonia.  Patient does have improvement in her pain after medications in the department.  Discussed home care for flank pain with outpatient follow-up and return precautions.        Final Clinical Impression(s) / ED Diagnoses Final diagnoses:  Right flank pain    Rx / DC Orders ED Discharge Orders          Ordered    cyclobenzaprine (FLEXERIL) 10 MG tablet  2 times daily PRN        06/05/22 9604              Tilden Fossa, MD 06/05/22 [phone]

## 2022-06-18 LAB — HM MAMMOGRAPHY

## 2022-09-28 IMAGING — CT CT ABD-PELV W/ CM
2 of 5 series · 15 of 46 positions shown, 17 images · IV contrast (Omni 300)
Comparison: 02/05/2020

CLINICAL DATA: Status post cholecystectomy. Worsening pain with
progressive drainage from JP drain.

EXAM:
CT ABDOMEN AND PELVIS WITH CONTRAST
TECHNIQUE: Multidetector CT imaging of the abdomen and pelvis was performed
using the standard protocol following bolus administration of
intravenous contrast.

[Series 3: a/p w/ 5mm · axial · 0.90mm/px · z∈[+813,+1303]mm · 12 of 110 slices shown, 14 images]
[im 6/110  soft-tissue]
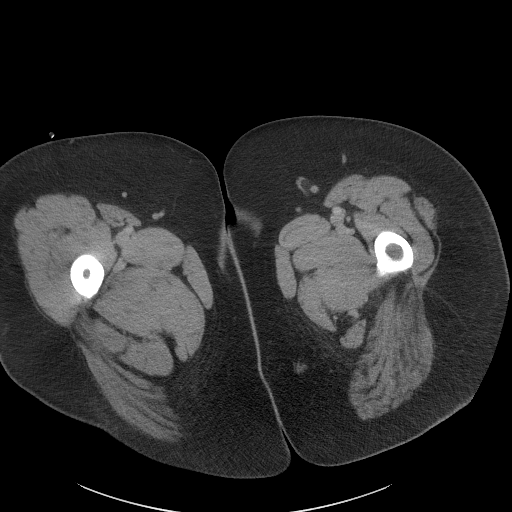
[im 6/110  bone]
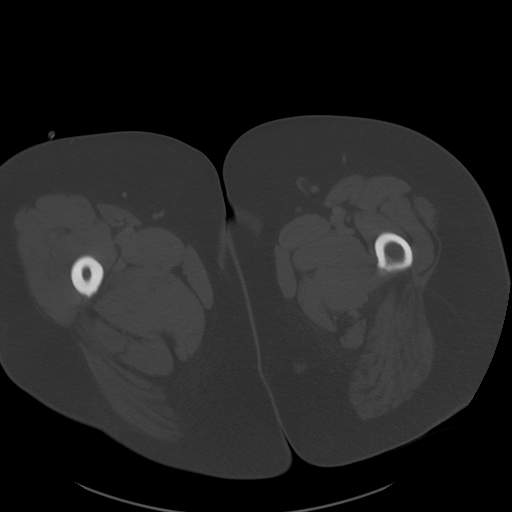
[im 17/110  soft-tissue]
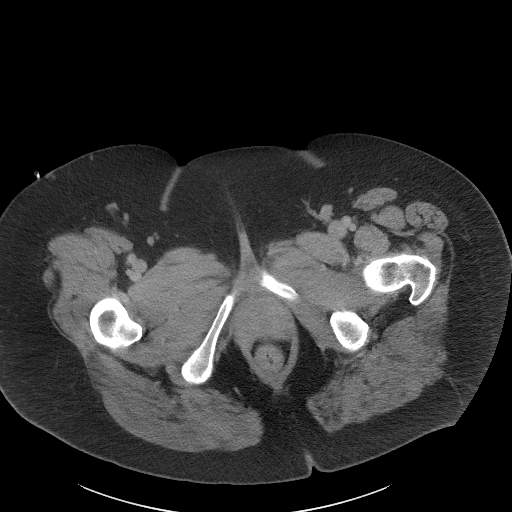
[im 22/110  soft-tissue]
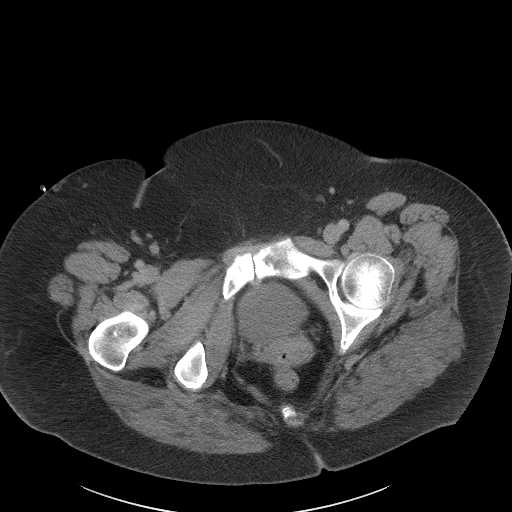
[im 33/110  soft-tissue]
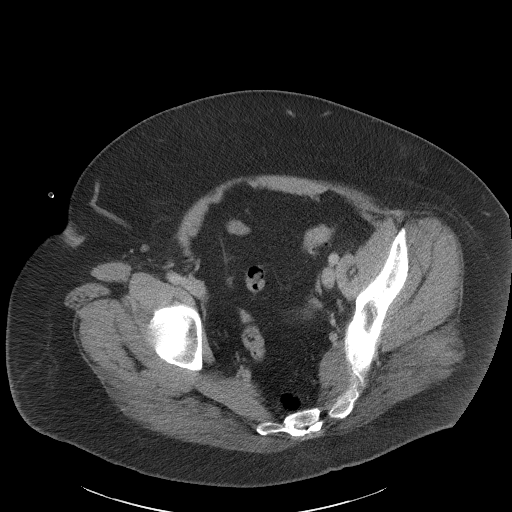
[im 44/110  soft-tissue]
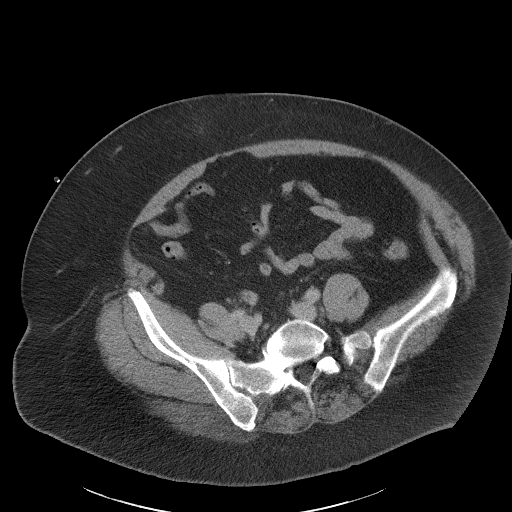
[im 50/110  soft-tissue]
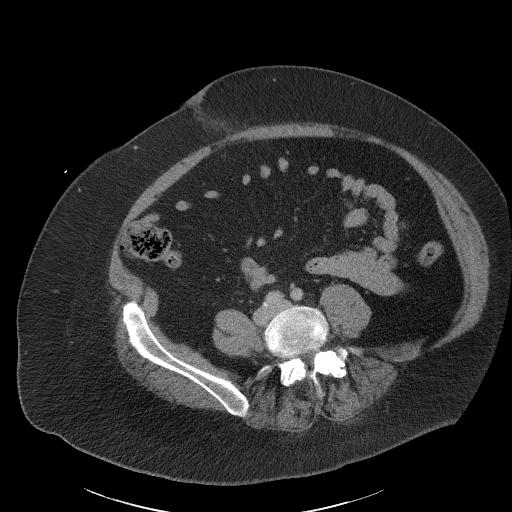
[im 60/110  soft-tissue]
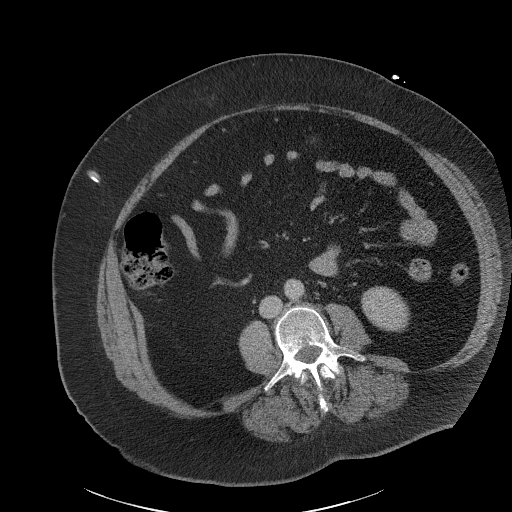
[im 66/110  soft-tissue]
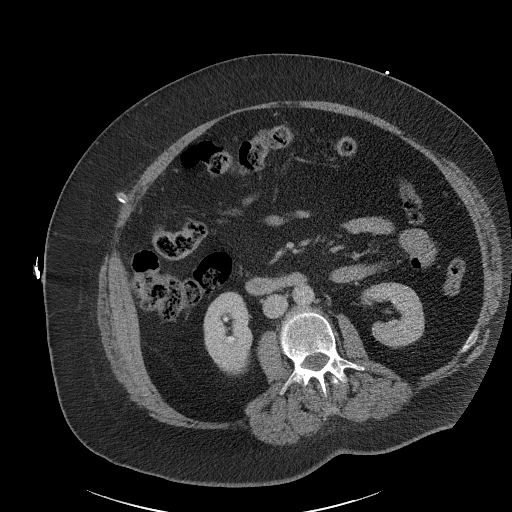
[im 77/110  soft-tissue]
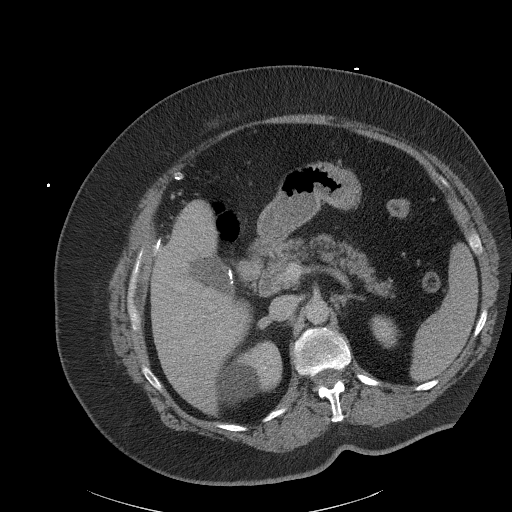
[im 77/110  bone]
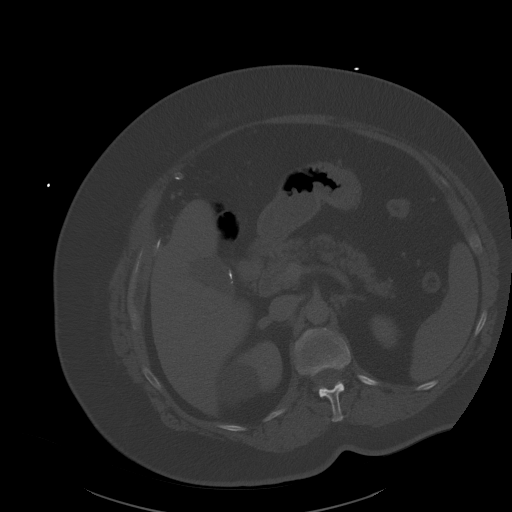
[im 88/110  soft-tissue]
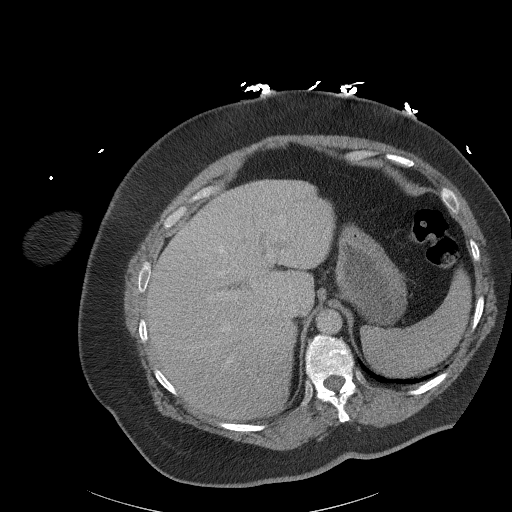
[im 93/110  soft-tissue]
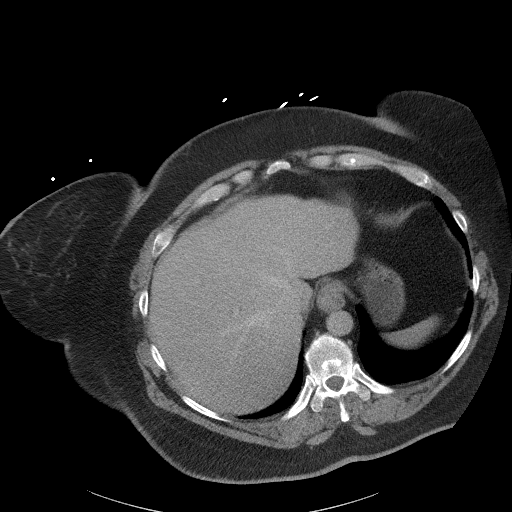
[im 104/110  soft-tissue]
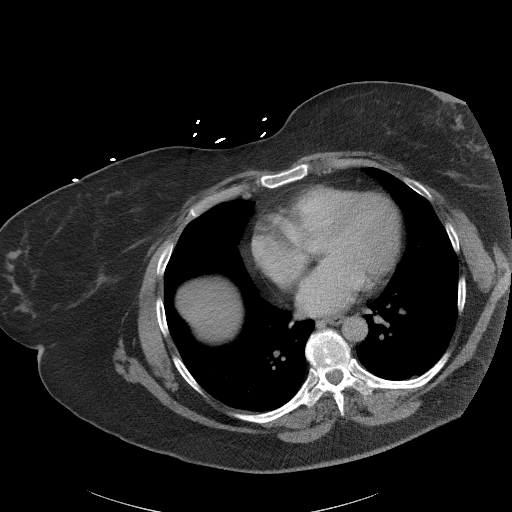

[Series 7: a/p w/ cor · coronal · 0.94mm/px · 3 of 192 slices shown]
[im 64/192  soft-tissue]
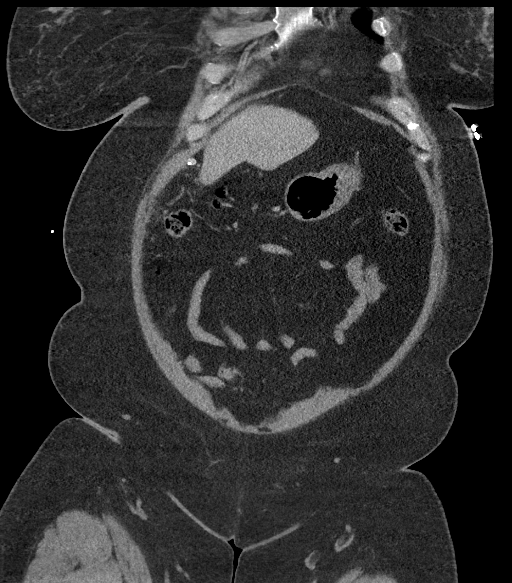
[im 85/192  soft-tissue]
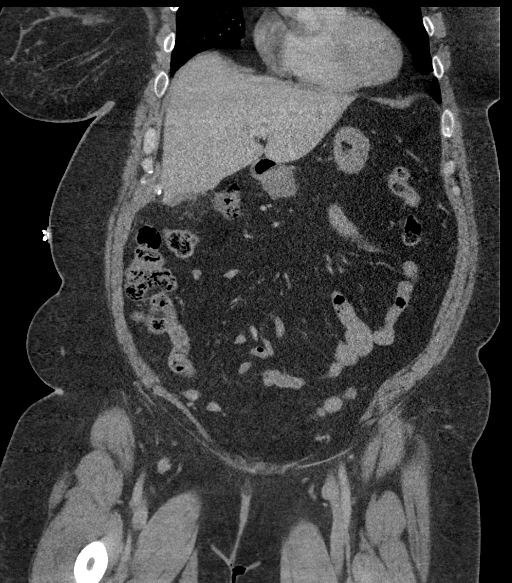
[im 107/192  soft-tissue]
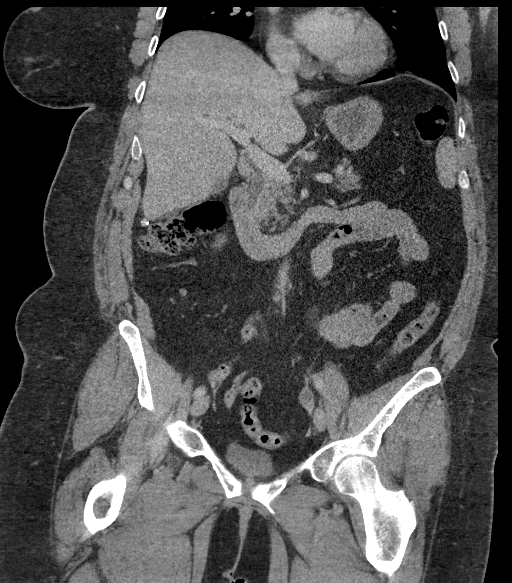

[15 of 46 positions shown; findings below may reference images not displayed]

RADIATION DOSE REDUCTION: This exam was performed according to the
departmental dose-optimization program which includes automated
exposure control, adjustment of the mA and/or kV according to
patient size and/or use of iterative reconstruction technique.

CONTRAST:  100mL OMNIPAQUE IOHEXOL 300 MG/ML  SOLN
FINDINGS: Lower chest: No acute abnormality.

Hepatobiliary: There is no suspicious liver lesion identified.
Previous cholecystectomy. Within the gallbladder fossa there is a
small collection of intermediate attenuation material measuring
x 2.8 x 2.4 cm (volume = 18 cm^3), image 35/3. In the early
postoperative time frame this is a nonspecific finding and may
represent postoperative seroma, hematoma and/or Surgicel. A surgical
drain is identified which enters from a right lateral abdominal
approach. The tip terminates along the inferior medial margin of the
right lobe of liver.

The common bile duct measures up to 9 mm, image 103/7. There is mild
intrahepatic bile duct dilatation. No CT visible stones identified
within the common bile duct.

Pancreas: Unremarkable. No pancreatic ductal dilatation or
surrounding inflammatory changes.

Spleen: Normal in size without focal abnormality.

Adrenals/Urinary Tract: Normal adrenal glands. Upper pole of right
kidney cyst measures 4.0 x 4.9 cm. No hydronephrosis or mass
identified bilaterally. Urinary bladder is unremarkable.

Stomach/Bowel: Stomach is nondistended. There is no bowel wall
thickening, inflammation, or distension.

Vascular/Lymphatic: Normal appearance of the abdominal aorta. The
upper abdominal vascularity appears normal.

Reproductive: Uterus and bilateral adnexa are unremarkable.

Other: No fluid identified within the abdomen or pelvis. No
pneumoperitoneum.

Musculoskeletal: Postoperative changes identified within the ventral
abdominal wall. No acute or suspicious osseous findings.
Degenerative changes are noted at the pubic symphysis.
IMPRESSION: 1. Status post interval cholecystectomy. There is a small collection
of intermediate attenuation material within the gallbladder fossa
which is a nonspecific finding and may represent postoperative
seroma, hematoma and or Surgicel. If there is a clinical concern for
bile leak consider nuclear medicine hepatobiliary scan.
2. Mild intrahepatic bile duct dilatation with common bile duct
measuring up to 9 mm[address] No [address] visible stones identified within the
common bile duct.

## 2023-01-12 ENCOUNTER — Ambulatory Visit
Admission: EM | Admit: 2023-01-12 | Discharge: 2023-01-12 | Disposition: A | Discharge status: Home / Self Care | Payer: Managed Care, Other (non HMO) | Attending: Emergency Medicine | Admitting: Emergency Medicine

## 2023-01-12 DIAGNOSIS — J42 Unspecified chronic bronchitis: Secondary | ICD-10-CM | POA: Diagnosis not present

## 2023-01-12 MED ORDER — PREDNISONE 10 MG (21) PO TBPK: ORAL_TABLET | Freq: Every day | ORAL | 0 refills | Status: DC

## 2023-01-12 MED ORDER — AZITHROMYCIN 250 MG PO TABS: 250.0000 mg | ORAL_TABLET | Freq: Every day | ORAL | 0 refills | Status: DC

## 2023-01-12 NOTE — ED Provider Notes (Signed)
Renaldo Fiddler    CSN: [id number] Arrival date & time: 01/12/23  4098      History   Chief Complaint Chief Complaint  Patient presents with   Cough   Shortness of Breath    HPI Jamie Mckenzie is a 52 y.o. female.   Patient presents for evaluation of nasal congestion, nonproductive cough, shortness of breath with exertion, wheezing and right-sided ear pain and fullness present for 7 days.  Has attempted use of albuterol inhaler, Mucinex, DayQuil NyQuil and Zicam.  Known exposure to pneumonia.  History of chronic bronchitis.  Denies presence of fever, sore throat or abdominal symptoms.  Past Medical History:  Diagnosis Date   Abrasion of right wrist 04/15/2012   Arthritis    ankles   Cholecystitis, acute 02/2021   Cough 04/15/2012   Dental crowns present    GERD (gastroesophageal reflux disease)    Hypertension    Hypothyroidism    Morbid obesity (HCC)    Perimenopausal 03/26/2012   irregular periods   Retained orthopedic hardware 03/26/2012   left ankle   Stuffy and runny nose    clear drainage from nose - URI onset 03/30/2012    Patient Active Problem List   Diagnosis Date Noted   History of ERCP 09/18/2021   Elevated alkaline phosphatase level 09/18/2021   Colon cancer screening 09/18/2021   Eczema of external ear, 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   Class 3 severe obesity due to excess calories with serious comorbidity and body mass index (BMI) of 50.0 to 59.9 in adult [facility]) 12/12/2019   Essential hypertension 12/31/2018   Psoriasis 01/06/2013   Tobacco abuse 11/10/2011   Hypothyroidism 05/20/2010   MDD (recurrent major depressive disorder) in remission (HCC) 02/01/2008   OCULAR MIGRAINE 02/01/2008   Allergic rhinitis 02/01/2008   GERD 02/01/2008   FIBROCYSTIC BREAST DISEASE 02/01/2008    Past Surgical History:  Procedure Laterality Date   BILIARY STENT PLACEMENT  03/26/2021   Procedure: BILIARY STENT PLACEMENT;  Surgeon: Lemar Lofty., MD;   Location: [facility] [place] ENDOSCOPY;  Service: Gastroenterology;;   BILIARY STENT PLACEMENT N/A 07/16/2021   Procedure: BILIARY STENT REMOVAL;  Surgeon: Lemar Lofty., MD;  Location: [facility] ENDOSCOPY;  Service: Gastroenterology;  Laterality: N/A;   BIOPSY  07/16/2021   Procedure: BIOPSY;  Surgeon: Lemar Lofty., MD;  Location: [facility] ENDOSCOPY;  Service: Gastroenterology;;   BREAST BIOPSY Left 10/23/2014   Benign   CHOLECYSTECTOMY N/A 03/17/2021   Procedure: LAPAROSCOPIC CHOLECYSTECTOMY WITH ICG;  Surgeon: Emelia Loron, MD;  Location: [facility] OR;  Service: General;  Laterality: N/A;   COLONOSCOPY WITH PROPOFOL N/A 12/17/2021   Procedure: COLONOSCOPY WITH PROPOFOL;  Surgeon: Lemar Lofty., MD;  Location: [facility] ENDOSCOPY;  Service: Gastroenterology;  Laterality: N/A;   DILATION AND EVACUATION  11/15/2008   ENDOSCOPIC RETROGRADE CHOLANGIOPANCREATOGRAPHY (ERCP) WITH PROPOFOL N/A 03/26/2021   Procedure: ENDOSCOPIC RETROGRADE CHOLANGIOPANCREATOGRAPHY (ERCP) WITH PROPOFOL;  Surgeon: Meridee Score Netty Starring., MD;  Location: [facility] ENDOSCOPY;  Service: Gastroenterology;  Laterality: N/A;   ENDOSCOPIC RETROGRADE CHOLANGIOPANCREATOGRAPHY (ERCP) WITH PROPOFOL N/A 07/16/2021   Procedure: ENDOSCOPIC RETROGRADE CHOLANGIOPANCREATOGRAPHY (ERCP) WITH PROPOFOL;  Surgeon: Meridee Score Netty Starring., MD;  Location: [facility] ENDOSCOPY;  Service: Gastroenterology;  Laterality: N/A;   HARDWARE REMOVAL Left 04/21/2012   Procedure: HARDWARE REMOVAL;  Surgeon: Toni Arthurs, MD;  Location: [facility];  Service: Orthopedics;  Laterality: Left;  LEFT ANKLE HARDWARE REMOVAL   HEMOSTASIS CLIP PLACEMENT  12/17/2021   Procedure: HEMOSTASIS CLIP PLACEMENT;  Surgeon: Lemar Lofty., MD;  Location:  [facility] ENDOSCOPY;  Service: Gastroenterology;;   HYSTEROSCOPY WITH D & C  10/24/2009   with polypectomy   MULTIPLE TOOTH EXTRACTIONS     ORIF ANKLE FRACTURE Right 1985   ORIF ANKLE FRACTURE  11/03/2011   Procedure: OPEN  REDUCTION INTERNAL FIXATION (ORIF) ANKLE FRACTURE;  Surgeon: Toni Arthurs, MD;  Location:  SURGERY CENTER;  Service: Orthopedics;  Laterality: Left;  Open Reduction Internal Fixation left ankle lateral malleolus fracture, left syndesmosis   POLYPECTOMY  12/17/2021   Procedure: POLYPECTOMY;  Surgeon: Mansouraty, Netty Starring., MD;  Location: [facility] ENDOSCOPY;  Service: Gastroenterology;;   REMOVAL OF STONES  03/26/2021   Procedure: REMOVAL OF STONES;  Surgeon: Lemar Lofty., MD;  Location: [facility] ENDOSCOPY;  Service: Gastroenterology;;   REMOVAL OF STONES  07/16/2021   Procedure: REMOVAL OF SLUDGE;  Surgeon: Lemar Lofty., MD;  Location: [facility] ENDOSCOPY;  Service: Gastroenterology;;   Dennison Mascot  03/26/2021   Procedure: Dennison Mascot;  Surgeon: Lemar Lofty., MD;  Location: [facility] ENDOSCOPY;  Service: Gastroenterology;;    OB History     Gravida  1   Para      Term      Preterm      AB      Living         SAB      IAB      Ectopic      Multiple      Live Births               Home Medications    Prior to Admission medications   Medication Sig Start Date End Date Taking? Authorizing Provider  azithromycin (ZITHROMAX) 250 MG tablet Take 1 tablet (250 mg total) by mouth daily. Take first 2 tablets together, then 1 every day until finished. 01/12/23  Yes Tamarcus Condie R, NP  predniSONE (STERAPRED UNI-PAK 21 TAB) 10 MG (21) TBPK tablet Take by mouth daily. Take 6 tabs by mouth daily  for 1 days, then 5 tabs for 1 days, then 4 tabs for 1 days, then 3 tabs for 1 days, 2 tabs for 1 days, then 1 tab by mouth daily for 1 days 01/12/23  Yes Asheton Scheffler R, NP  acetaminophen (TYLENOL) 500 MG tablet Take 1,000 mg by mouth every 6 (six) hours as needed for mild pain.    [provider]  albuterol (VENTOLIN HFA) 108 (90 Base) MCG/ACT inhaler Inhale 2 puffs into the lungs every 6 (six) hours as needed for wheezing or shortness of breath.     [provider]  augmented betamethasone dipropionate (DIPROLENE-AF) 0.05 % ointment Apply 1 Application topically daily as needed (eczema). 08/12/21   [provider]  cyclobenzaprine (FLEXERIL) 10 MG tablet Take 1 tablet (10 mg total) by mouth 2 (two) times daily as needed for muscle spasms. Patient not taking: Reported on 01/12/2023 06/05/22   Tilden Fossa, MD  doxycycline (VIBRA-TABS) 100 MG tablet Take 1 tablet (100 mg total) by mouth 2 (two) times daily. Patient not taking: Reported on 12/11/2021 11/11/21   Excell Seltzer, MD  ELDERBERRY PO Take 1 tablet by mouth daily as needed (immune support).    [provider]  GAVILAX 17 GM/SCOOP powder TAKE 238 G BY MOUTH DAILY. Patient not taking: Reported on 01/12/2023 12/16/21   Mansouraty, Netty Starring., MD  ibuprofen (ADVIL) 200 MG tablet Take 400-800 mg by mouth every 6 (six) hours as needed for headache or mild pain.    [provider]  levothyroxine (  SYNTHROID) 25 MCG tablet Take 1 tablet (25 mcg total) by mouth daily. 11/11/21   Bedsole, Amy E, MD  losartan-hydrochlorothiazide (HYZAAR) 50-12.5 MG tablet Take 1 tablet by mouth daily. 11/11/21   Bedsole, Amy E, MD  Multiple Vitamins-Minerals (MULTI FOR HER PO) Take 1 tablet by mouth daily.    [provider]  omeprazole (PRILOSEC) 20 MG capsule Take 1 capsule (20 mg total) by mouth daily. 05/21/16   Bedsole, Amy E, MD  Semaglutide,0.25 or 0.5MG /DOS, (OZEMPIC, 0.25 OR 0.5 MG/DOSE,) 2 MG/1.5ML SOPN Inject 0.5 mg into the skin every Tuesday.    [provider]  Semaglutide-Weight Management (WEGOVY) 1 MG/0.5ML SOAJ Inject 1 mg into the skin once a week 01/20/22     terbinafine (LAMISIL) 1 % cream Apply topically 2 (two) times daily 01/20/22       Family History Family History  Problem Relation Age of Onset   Hypertension Mother    Arthritis Mother    Obesity Mother        morbid   Myasthenia gravis Mother    Cancer Father        non  hodgkins lymphoma   Breast cancer Maternal Aunt    Cancer Maternal Grandmother        lung   Heart disease Maternal Grandfather        heart attack   COPD Paternal Grandfather    Colon cancer Neg Hx    Esophageal cancer Neg Hx    Inflammatory bowel disease Neg Hx    Liver disease Neg Hx    Pancreatic cancer Neg Hx    Rectal cancer Neg Hx    Stomach cancer Neg Hx     Social History Social History   Tobacco Use   Smoking status: Light Smoker    Types: Cigarettes   Smokeless tobacco: Never   Tobacco comments:    2-3 cig./day  Vaping Use   Vaping status: Never Used  Substance Use Topics   Alcohol use: Yes    Comment: socially   Drug use: No     Allergies   Patient has no known allergies.   Review of Systems Review of Systems  Respiratory:  Positive for cough and shortness of breath.      Physical Exam Triage Vital Signs ED Triage Vitals  Encounter Vitals Group     BP 01/12/23 0848 (!) 157/81     Systolic BP Percentile --      Diastolic BP Percentile --      Pulse Rate 01/12/23 0848 93     Resp 01/12/23 0848 (!) 23     Temp 01/12/23 0848 99.2 F (37.3 C)     Temp src --      SpO2 01/12/23 0848 91 %     Weight --      Height --      Head Circumference --      Peak Flow --      Pain Score 01/12/23 0846 1     Pain Loc --      Pain Education --      Exclude from Growth Chart --    No data found.  Updated Vital Signs BP (!) 157/81   Pulse 93   Temp 99.2 F (37.3 C)   Resp (!) 23   LMP 11/23/2020   SpO2 91%   Visual Acuity Right Eye Distance:   Left Eye Distance:   Bilateral Distance:    Right Eye Near:   Left Eye  Near:    Bilateral Near:     Physical Exam Constitutional:      Appearance: Normal appearance.  HENT:     Head: Normocephalic.     Right Ear: Hearing, ear canal and external ear normal. A middle ear effusion is present.     Left Ear: Hearing, ear canal and external ear normal. A middle ear effusion is present.     Nose:  Congestion present. No rhinorrhea.     Mouth/Throat:     Mouth: Mucous membranes are moist.     Pharynx: Oropharynx is clear. No oropharyngeal exudate or posterior oropharyngeal erythema.  Eyes:     Extraocular Movements: Extraocular movements intact.  Cardiovascular:     Rate and Rhythm: Normal rate and regular rhythm.     Pulses: Normal pulses.     Heart sounds: Normal heart sounds.  Pulmonary:     Breath sounds: Normal breath sounds.     Comments: Labored breathing and persistent harsh cough Skin:    General: Skin is warm and dry.  Neurological:     Mental Status: She is alert and oriented to person, place, and time. Mental status is at baseline.      UC Treatments / Results  Labs (all labs ordered are listed, but only abnormal results are displayed) Labs Reviewed - No data to display  EKG   Radiology No results found.  Procedures Procedures (including critical care time)  Medications Ordered in UC Medications - No data to display  Initial Impression / Assessment and Plan / UC Course  I have reviewed the triage vital signs and the nursing notes.  Pertinent labs & imaging results that were available during my care of the patient were reviewed by me and considered in my medical decision making (see chart for details).  Bronchitis  Vital signs are stable, O2 saturation 91% on room air, patient endorsing chest tightness and breathing appears to be labored, albuterol nebulizer given, reevaluation symptoms have resolved and lungs are clear, O2 saturation has increased, prescribed azithromycin and prednisone taper for outpatient use recommended continue use of over-the-counter medications for support and advised follow-up if symptoms persist or worsen Final Clinical Impressions(s) / UC Diagnoses   Final diagnoses:  Chronic bronchitis, unspecified chronic bronchitis type (HCC)     Discharge Instructions      Begin azithromycin as directed to provide coverage for  bacteria which may be contributing to your symptoms  Begin prednisone every morning with food as directed to open and relax the airway which should help settle the shortness of breath and wheezing  You may continue use of over-the-counter medications as well as attempt any of the following below  Continue use of your albuterol inhaler for flareups    You can take Tylenol and/or Ibuprofen as needed for fever reduction and pain relief.   For cough: honey 1/2 to 1 teaspoon (you can dilute the honey in water or another fluid).  You can also use guaifenesin and dextromethorphan for cough. You can use a humidifier for chest congestion and cough.  If you don't have a humidifier, you can sit in the bathroom with the hot shower running.      For sore throat: try warm salt water gargles, cepacol lozenges, throat spray, warm tea or water with lemon/honey, popsicles or ice, or OTC cold relief medicine for throat discomfort.   For congestion: take a daily anti-histamine like Zyrtec, Claritin, and a oral decongestant, such as pseudoephedrine.  You can also use Flonase  1-2 sprays in each nostril daily.   It is important to stay hydrated: drink plenty of fluids (water, gatorade/powerade/pedialyte, juices, or teas) to keep your throat moisturized and help further relieve irritation/discomfort.    ED Prescriptions     Medication Sig Dispense Auth. Provider   azithromycin (ZITHROMAX) 250 MG tablet Take 1 tablet (250 mg total) by mouth daily. Take first 2 tablets together, then 1 every day until finished. 6 tablet Shermika Balthaser R, NP   predniSONE (STERAPRED UNI-PAK 21 TAB) 10 MG (21) TBPK tablet Take by mouth daily. Take 6 tabs by mouth daily  for 1 days, then 5 tabs for 1 days, then 4 tabs for 1 days, then 3 tabs for 1 days, 2 tabs for 1 days, then 1 tab by mouth daily for 1 days 21 tablet Ojas Coone, Elita Boone, NP      PDMP not reviewed this encounter.   Valinda Hoar, [place] 01/12/23 [phone]

## 2023-01-12 NOTE — Discharge Instructions (Signed)
Begin azithromycin as directed to provide coverage for bacteria which may be contributing to your symptoms  Begin prednisone every morning with food as directed to open and relax the airway which should help settle the shortness of breath and wheezing  You may continue use of over-the-counter medications as well as attempt any of the following below  Continue use of your albuterol inhaler for flareups    You can take Tylenol and/or Ibuprofen as needed for fever reduction and pain relief.   For cough: honey 1/2 to 1 teaspoon (you can dilute the honey in water or another fluid).  You can also use guaifenesin and dextromethorphan for cough. You can use a humidifier for chest congestion and cough.  If you don't have a humidifier, you can sit in the bathroom with the hot shower running.      For sore throat: try warm salt water gargles, cepacol lozenges, throat spray, warm tea or water with lemon/honey, popsicles or ice, or OTC cold relief medicine for throat discomfort.   For congestion: take a daily anti-histamine like Zyrtec, Claritin, and a oral decongestant, such as pseudoephedrine.  You can also use Flonase 1-2 sprays in each nostril daily.   It is important to stay hydrated: drink plenty of fluids (water, gatorade/powerade/pedialyte, juices, or teas) to keep your throat moisturized and help further relieve irritation/discomfort.

## 2023-01-12 NOTE — ED Triage Notes (Signed)
Patient to Urgent Care with complaints of dry cough/ wheezing/ chest congestion. Shortness of breath w/ exertion and when laying flat. Denies any known fevers.  Exposure to flu/ pneumonia.  Hx of chronic bronchitis.   Symptoms started over a week ago.   Meds: Mucinex/ xycam/ dayquil and nyquil/ increased use of her albuterol inhaler.

## 2023-01-18 IMAGING — RF DG ERCP WO/W SPHINCTEROTOMY
1 series · 14 of 15 positions shown · non-contrast
Comparison: CT abdomen pelvis-03/26/2021; ERCP-03/26/2021

Fluoroscopy time: 1 minute, 26 seconds (70.6 mGy)

CLINICAL DATA: ERCP with stent retrieval.  History of bile leak.

EXAM:
ERCP
TECHNIQUE: Multiple spot images obtained with the fluoroscopic device and
submitted for interpretation post-procedure.

[Series 1: run · 12 acquisitions, 14 frames shown]
[im 1/12]
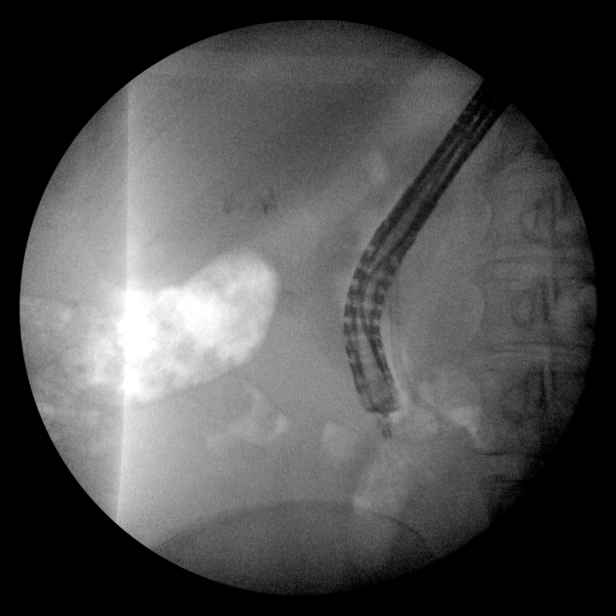
[im 2/12]
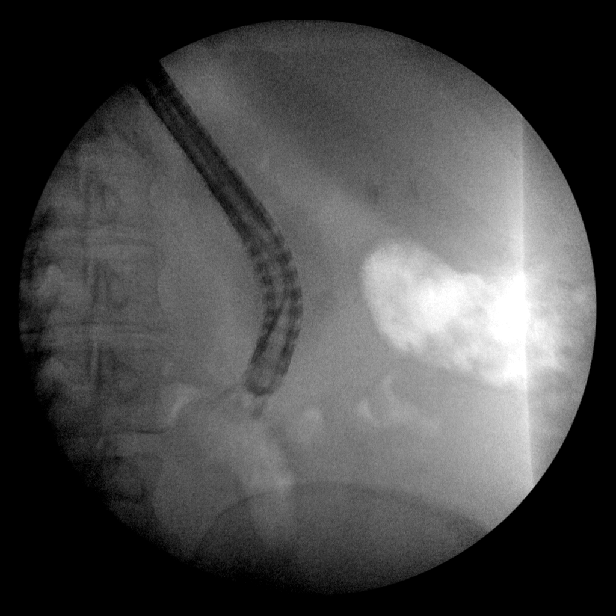
[im 3/12]
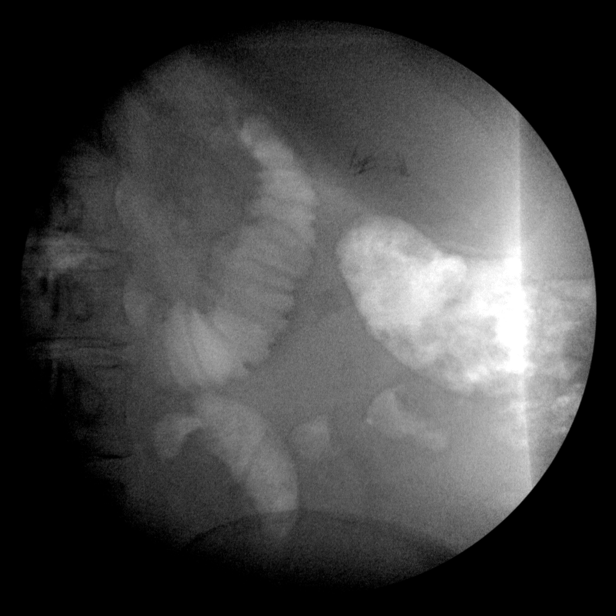
[im 4/12]
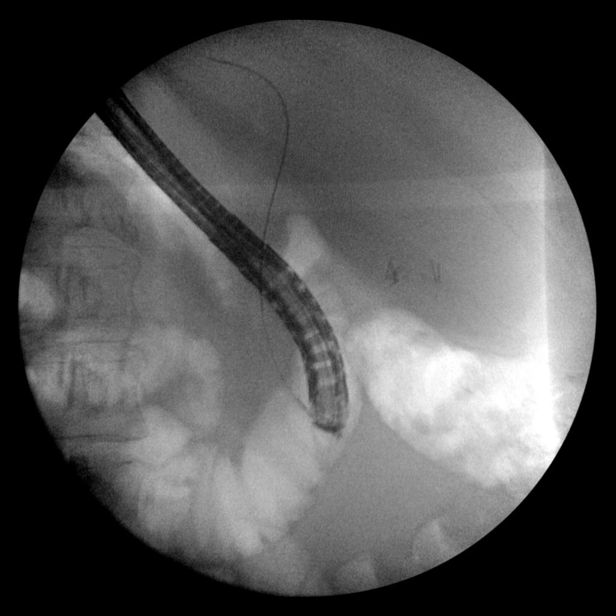
[im 5/12]
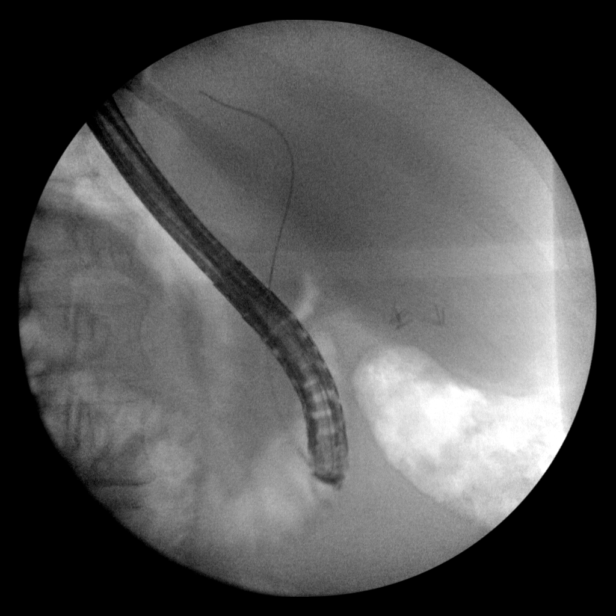
[im 6/12]
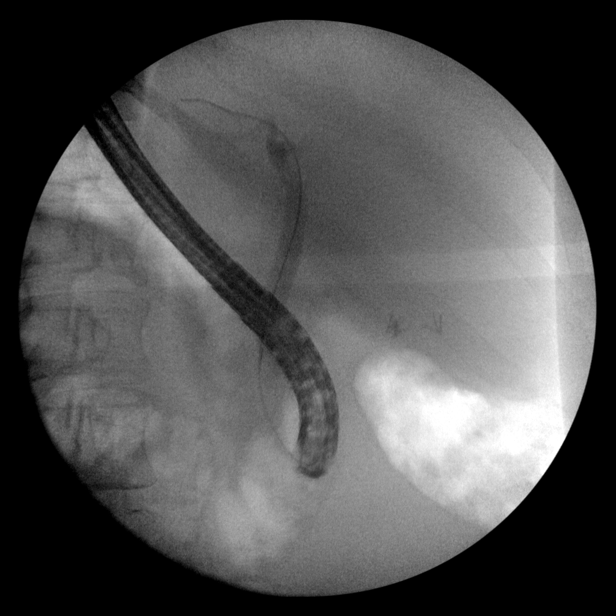
[im 7/12]
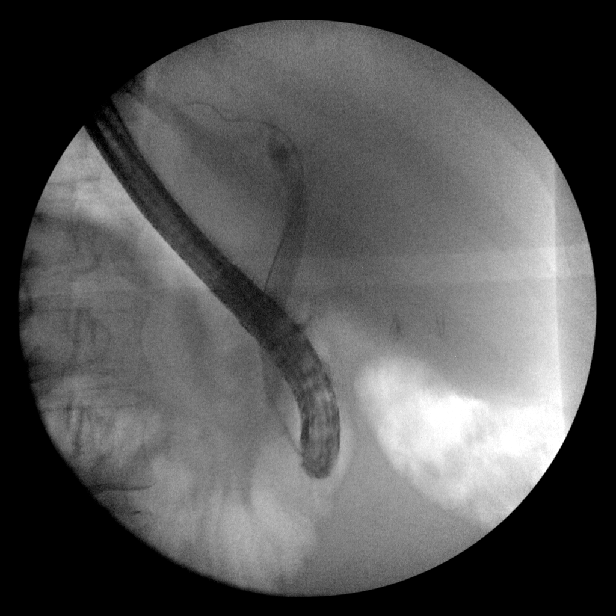
[im 8/12]
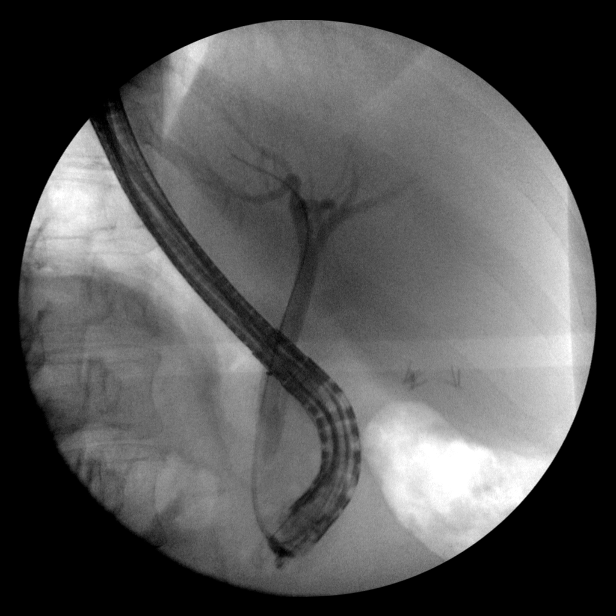
[im 8/12]
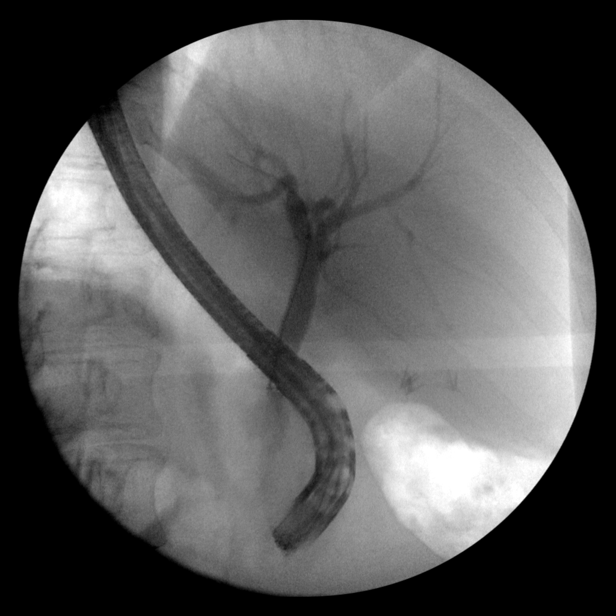
[im 8/12]
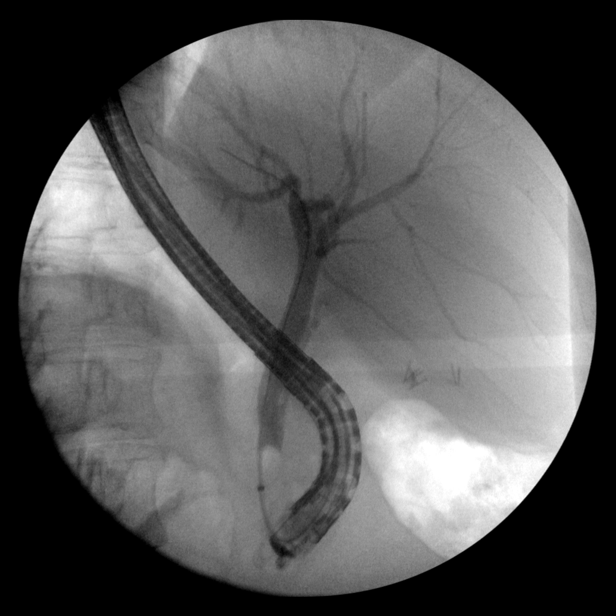
[im 9/12]
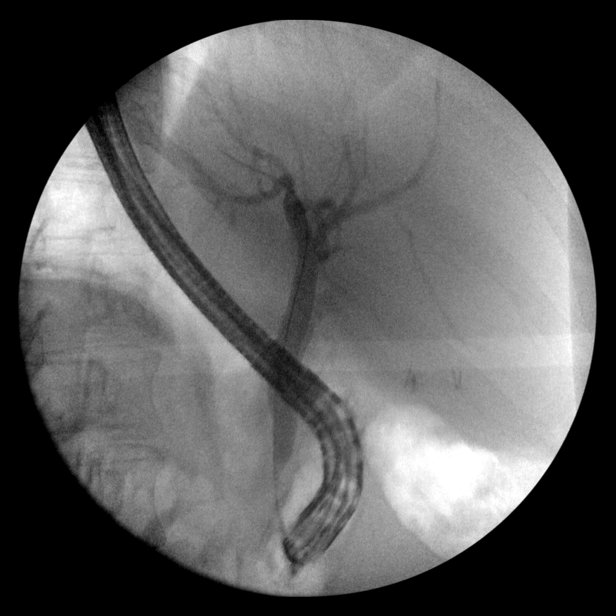
[im 10/12]
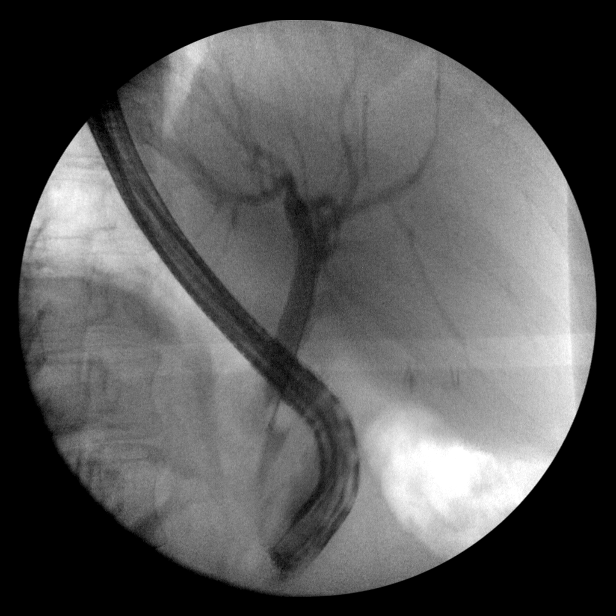
[im 11/12]
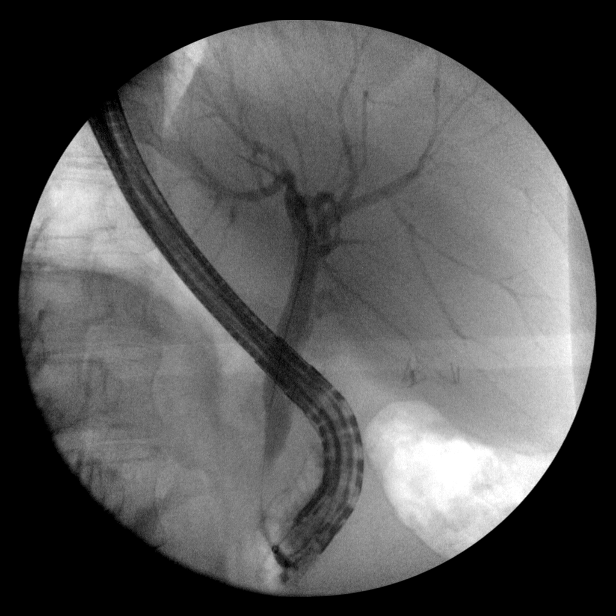
[im 12/12]
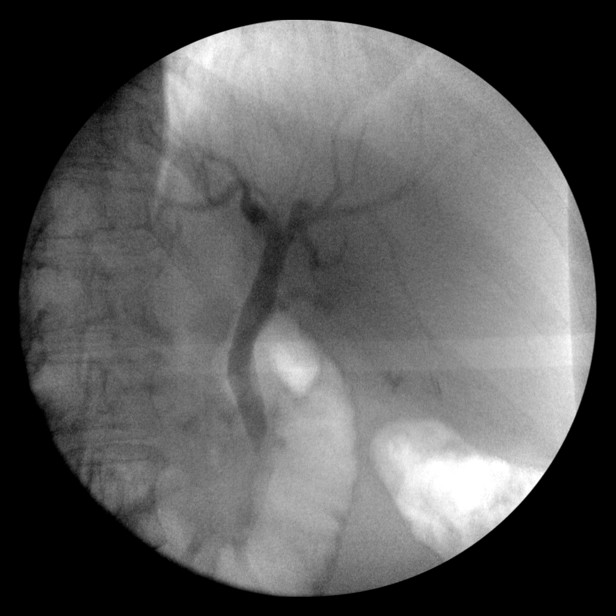

[14 of 15 positions shown; findings below may reference images not displayed]

FINDINGS: Multiple spot fluoroscopic images were obtained during ERCP.

Initial image demonstrates an ERCP probe overlying the right upper
abdominal quadrant. An internal biliary stent overlies expected
location of the CBD. Clips overlie the expected location of the
gallbladder fossa. Interval removal of large bore surgical drainage
catheter.

Subsequent images demonstrate removal of the internal biliary stent
with subsequent selective cannulation and opacification of the CBD.
Subsequent images demonstrate insufflation of a balloon within the
distal aspect of the CBD with subsequent biliary sweeping and
presumed sphincterotomy.

There is minimal opacification of the intrahepatic biliary tree
which appears mildly dilated. There is minimal opacification of the
residual cystic duct without definitive evidence of a bile leak.
IMPRESSION: ERCP with biliary stent removal, CBD sweeping and presumed
sphincterotomy. There is minimal opacification of the residual
cystic duct without definitive evidence of a bile leak.

These images were submitted for radiologic interpretation only.
Please see the procedural report for the amount of contrast and the
fluoroscopy time utilized.

## 2023-02-19 ENCOUNTER — Other Ambulatory Visit: Payer: Self-pay

## 2023-02-19 ENCOUNTER — Other Ambulatory Visit (HOSPITAL_COMMUNITY): Payer: Self-pay

## 2023-02-19 MED ORDER — LOSARTAN POTASSIUM-HCTZ 50-12.5 MG PO TABS
1.0000 | ORAL_TABLET | Freq: Every day | ORAL | 0 refills | Status: DC
  Filled 2023-02-19: qty 30, 30d supply, fill #0
  Filled 2023-04-22: qty 30, 30d supply, fill #1
  Filled 2023-06-15: qty 30, 30d supply, fill #2

## 2023-02-19 MED ORDER — LEVOTHYROXINE SODIUM 25 MCG PO TABS
25.0000 ug | ORAL_TABLET | Freq: Every day | ORAL | 0 refills | Status: DC
  Filled 2023-02-19: qty 30, 30d supply, fill #0
  Filled 2023-04-22: qty 30, 30d supply, fill #1
  Filled 2023-06-15: qty 30, 30d supply, fill #2

## 2023-02-19 MED ORDER — VITAMIN D (ERGOCALCIFEROL) 1.25 MG (50000 UNIT) PO CAPS
50000.0000 [IU] | ORAL_CAPSULE | ORAL | 1 refills | Status: AC
  Filled 2023-02-19: qty 4, 28d supply, fill #0

## 2023-02-19 MED ORDER — WEGOVY 1 MG/0.5ML ~~LOC~~ SOAJ
1.0000 mg | SUBCUTANEOUS | 0 refills | Status: DC
  Filled 2023-02-19: qty 2, 28d supply, fill #0

## 2023-02-19 MED ORDER — TERBINAFINE HCL 1 % EX CREA
TOPICAL_CREAM | Freq: Two times a day (BID) | CUTANEOUS | 3 refills | Status: DC
  Filled 2023-02-19: qty 30, 15d supply, fill #0

## 2023-02-23 ENCOUNTER — Other Ambulatory Visit (HOSPITAL_COMMUNITY): Payer: Self-pay

## 2023-06-08 ENCOUNTER — Encounter: Admitting: Family Medicine

## 2023-06-08 ENCOUNTER — Encounter: Payer: Managed Care, Other (non HMO) | Admitting: Nurse Practitioner

## 2023-06-15 ENCOUNTER — Ambulatory Visit: Admitting: Family Medicine

## 2023-06-15 ENCOUNTER — Encounter: Payer: Self-pay | Admitting: Family Medicine

## 2023-06-15 ENCOUNTER — Encounter: Admitting: Family Medicine

## 2023-06-15 VITALS — BP 114/84 | HR 101 | Temp 97.3°F | Ht 64.5 in | Wt 330.0 lb

## 2023-06-15 DIAGNOSIS — F331 Major depressive disorder, recurrent, moderate: Secondary | ICD-10-CM

## 2023-06-15 DIAGNOSIS — E039 Hypothyroidism, unspecified: Secondary | ICD-10-CM

## 2023-06-15 DIAGNOSIS — Z131 Encounter for screening for diabetes mellitus: Secondary | ICD-10-CM

## 2023-06-15 DIAGNOSIS — Z6841 Body Mass Index (BMI) 40.0 and over, adult: Secondary | ICD-10-CM

## 2023-06-15 DIAGNOSIS — I10 Essential (primary) hypertension: Secondary | ICD-10-CM

## 2023-06-15 DIAGNOSIS — L409 Psoriasis, unspecified: Secondary | ICD-10-CM

## 2023-06-15 DIAGNOSIS — F3340 Major depressive disorder, recurrent, in remission, unspecified: Secondary | ICD-10-CM

## 2023-06-15 DIAGNOSIS — K219 Gastro-esophageal reflux disease without esophagitis: Secondary | ICD-10-CM | POA: Diagnosis not present

## 2023-06-15 DIAGNOSIS — L309 Dermatitis, unspecified: Secondary | ICD-10-CM

## 2023-06-15 DIAGNOSIS — R748 Abnormal levels of other serum enzymes: Secondary | ICD-10-CM

## 2023-06-15 DIAGNOSIS — N951 Menopausal and female climacteric states: Secondary | ICD-10-CM

## 2023-06-15 DIAGNOSIS — E66813 Obesity, class 3: Secondary | ICD-10-CM

## 2023-06-15 LAB — HEMOGLOBIN A1C: Hgb A1c MFr Bld: 5.7 % (ref 4.6–6.5)

## 2023-06-15 LAB — COMPREHENSIVE METABOLIC PANEL WITH GFR
ALT: 20 U/L (ref 0–35)
AST: 14 U/L (ref 0–37)
Albumin: 4.3 g/dL (ref 3.5–5.2)
Alkaline Phosphatase: 158 U/L — ABNORMAL HIGH (ref 39–117)
BUN: 19 mg/dL (ref 6–23)
CO2: 33 meq/L — ABNORMAL HIGH (ref 19–32)
Calcium: 10 mg/dL (ref 8.4–10.5)
Chloride: 99 meq/L (ref 96–112)
Creatinine, Ser: 0.81 mg/dL (ref 0.40–1.20)
GFR: 82.49 mL/min (ref >60.00)
Glucose, Bld: 98 mg/dL (ref 70–99)
Potassium: 4.6 meq/L (ref 3.5–5.1)
Sodium: 140 meq/L (ref 135–145)
Total Bilirubin: 0.8 mg/dL (ref 0.2–1.2)
Total Protein: 7.4 g/dL (ref 6.0–8.3)

## 2023-06-15 LAB — TSH: TSH: 4.01 u[IU]/mL (ref 0.35–5.50)

## 2023-06-15 LAB — LIPID PANEL
Cholesterol: 194 mg/dL (ref 0–200)
HDL: 38.6 mg/dL — ABNORMAL LOW (ref >39.00)
LDL Cholesterol: 127 mg/dL — ABNORMAL HIGH (ref 0–99)
NonHDL: 155.42
Total CHOL/HDL Ratio: 5
Triglycerides: 142 mg/dL (ref 0.0–149.0)
VLDL: 28.4 mg/dL (ref 0.0–40.0)

## 2023-06-15 LAB — T3, FREE: T3, Free: 3.9 pg/mL (ref 2.3–4.2)

## 2023-06-15 LAB — T4, FREE: Free T4: 0.92 ng/dL (ref 0.60–1.60)

## 2023-06-15 MED ORDER — TIRZEPATIDE-WEIGHT MANAGEMENT 2.5 MG/0.5ML ~~LOC~~ SOLN: 2.5000 mg | SUBCUTANEOUS | 3 refills | Status: DC

## 2023-06-15 NOTE — Progress Notes (Signed)
 Patient ID: Jamie Mckenzie, female    DOB: 27-Aug-1970, 53 y.o.   MRN: [id number]  This visit was conducted in person.  BP 114/84 (BP Location: Right Arm, Patient Position: Sitting, Cuff Size: Large)   Pulse (!) 101   Temp (!) 97.3 F (36.3 C) (Temporal)   Ht 5' 4.5" (1.638 m)   Wt (!) 330 lb (149.7 kg)   LMP 11/23/2020   SpO2 92%   BMI 55.77 kg/m    CC:  Chief Complaint  Patient presents with   Re-Establish Care    Subjective:   HPI: Jamie Mckenzie is a 53 y.o. female presenting on 06/15/2023 for Re-Establish Care  Seen by myself 2 years ago.  Pt moved to [place]. Pt is transferring care back now.   Hx of MDD.Aaron Aas  She is having issues with mood.Aaron Aas anger, sleeping a lot.   Has anhedonia.  No suicidal thoughts.  Was on celexa in past.    06/15/2023    1:06 PM 01/23/2020    8:43 AM 12/29/2019    3:14 PM  Depression screen PHQ 2/9  Decreased Interest 3 0 1  Down, Depressed, Hopeless 3 0 1  PHQ - 2 Score 6 0 2  Altered sleeping 3  1  Tired, decreased energy 3  3  Change in appetite 3  0  Feeling bad or failure about yourself  0  0  Trouble concentrating 0  0  Moving slowly or fidgety/restless 0  0  Suicidal thoughts 0  0  PHQ-9 Score 15  6  Difficult doing work/chores Somewhat difficult  Not difficult at all      06/15/2023    1:06 PM 12/12/2019   10:06 AM  GAD 7 : Generalized Anxiety Score  Nervous, Anxious, on Edge 0 0  Control/stop worrying 0 0  Worry too much - different things 0 0  Trouble relaxing 0 0  Restless 0 0  Easily annoyed or irritable 1 1  Afraid - awful might happen 0 0  Total GAD 7 Score 1 1  Anxiety Difficulty Somewhat difficult Somewhat difficult      Hypertension:   Losartan  hydrochlorothiazide   50/12.5 mg daily  BP Readings from Last 3 Encounters:  06/15/23 114/84  01/12/23 (!) 157/81  06/05/22 132/72  Using medication without problems or lightheadedness:  none  Chest pain with exertion: none Edema: none Short of  breath:  yes Average home BPs: Other issues:   Was on semaglutide  for 3 months 2023   but had severe constipation,  nausea. Wt Readings from Last 3 Encounters:  06/15/23 (!) 330 lb (149.7 kg)  06/05/22 (!) 310 lb (140.6 kg)  12/17/21 (!) 310 lb 6.5 oz (140.8 kg)   Working on healthy eating and regular exercise. Food prepping more, less eating out.     Lab Results  Component Value Date   TSH 4.01 06/15/2023    As seen a dermatologist ( was not happy with care) in past for psoriasis and possible eczema on hands.  Steroids help minimally with hand symtpoms.   Relevant past medical, surgical, family and social history reviewed and updated as indicated. Interim medical history since our last visit reviewed. Allergies and medications reviewed and updated. Outpatient Medications Prior to Visit  Medication Sig Dispense Refill   acetaminophen  (TYLENOL ) 500 MG tablet Take 1,000 mg by mouth every 6 (six) hours as needed for mild pain.     albuterol  (VENTOLIN  HFA) 108 (90 Base) MCG/ACT inhaler Inhale 2  puffs into the lungs every 6 (six) hours as needed for wheezing or shortness of breath.     ELDERBERRY PO Take 1 tablet by mouth daily as needed (immune support).     ibuprofen  (ADVIL ) 200 MG tablet Take 400-800 mg by mouth every 6 (six) hours as needed for headache or mild pain.     levothyroxine  (SYNTHROID ) 25 MCG tablet Take 1 Tablet by mouth once daily. 90 tablet 0   losartan -hydrochlorothiazide  (HYZAAR ) 50-12.5 MG tablet Take 1 tablet by mouth daily. 90 tablet 0   Multiple Vitamins-Minerals (MULTI FOR HER PO) Take 1 tablet by mouth daily.     omeprazole  (PRILOSEC) 20 MG capsule Take 1 capsule (20 mg total) by mouth daily. 90 capsule 1   Vitamin D , Ergocalciferol , (DRISDOL ) 1.25 MG (50000 UNIT) CAPS capsule Take 1 capsule (50,000 Units total) by mouth once a week. 16 capsule 1   augmented betamethasone dipropionate (DIPROLENE-AF) 0.05 % ointment Apply 1 Application topically daily as needed  (eczema).     azithromycin  (ZITHROMAX ) 250 MG tablet Take 1 tablet (250 mg total) by mouth daily. Take first 2 tablets together, then 1 every day until finished. 6 tablet 0   cyclobenzaprine  (FLEXERIL ) 10 MG tablet Take 1 tablet (10 mg total) by mouth 2 (two) times daily as needed for muscle spasms. (Patient not taking: Reported on 01/12/2023) 20 tablet 0   doxycycline  (VIBRA -TABS) 100 MG tablet Take 1 tablet (100 mg total) by mouth 2 (two) times daily. (Patient not taking: Reported on 12/11/2021) 14 tablet 0   GAVILAX 17 GM/SCOOP powder TAKE 238 G BY MOUTH DAILY. (Patient not taking: Reported on 01/12/2023) 238 g 0   levothyroxine  (SYNTHROID ) 25 MCG tablet Take 1 tablet (25 mcg total) by mouth daily. 90 tablet 3   losartan -hydrochlorothiazide  (HYZAAR ) 50-12.5 MG tablet Take 1 tablet by mouth daily. 90 tablet 3   predniSONE  (STERAPRED UNI-PAK 21 TAB) 10 MG (21) TBPK tablet Take by mouth daily. Take 6 tabs by mouth daily  for 1 days, then 5 tabs for 1 days, then 4 tabs for 1 days, then 3 tabs for 1 days, 2 tabs for 1 days, then 1 tab by mouth daily for 1 days 21 tablet 0   Semaglutide ,0.25 or 0.5MG /DOS, (OZEMPIC , 0.25 OR 0.5 MG/DOSE,) 2 MG/1.5ML SOPN Inject 0.5 mg into the skin every Tuesday.     Semaglutide -Weight Management (WEGOVY ) 1 MG/0.5ML SOAJ Inject 1 mg into the skin once a week 2 mL 0   Semaglutide -Weight Management (WEGOVY ) 1 MG/0.5ML SOAJ Inject 1 mg into the skin once a week 2 mL 0   terbinafine  (LAMISIL ) 1 % cream Apply topically 2 (two) times daily 12 g 3   terbinafine  (LAMISIL ) 1 % cream Apply topically 2 (two) times daily. 30 g 3   No facility-administered medications prior to visit.     Per HPI unless specifically indicated in ROS section below Review of Systems  Constitutional:  Negative for fatigue and fever.  HENT:  Negative for congestion.   Eyes:  Negative for pain.  Respiratory:  Negative for cough and shortness of breath.   Cardiovascular:  Negative for chest pain,  palpitations and leg swelling.  Gastrointestinal:  Negative for abdominal pain.  Genitourinary:  Negative for dysuria and vaginal bleeding.  Musculoskeletal:  Negative for back pain.  Neurological:  Negative for syncope, light-headedness and headaches.  Psychiatric/Behavioral:  Negative for dysphoric mood.    Objective:  BP 114/84 (BP Location: Right Arm, Patient Position: Sitting, Cuff Size: Large)  Pulse (!) 101   Temp (!) 97.3 F (36.3 C) (Temporal)   Ht 5' 4.5" (1.638 m)   Wt (!) 330 lb (149.7 kg)   LMP 11/23/2020   SpO2 92%   BMI 55.77 kg/m   Wt Readings from Last 3 Encounters:  06/15/23 (!) 330 lb (149.7 kg)  06/05/22 (!) 310 lb (140.6 kg)  12/17/21 (!) 310 lb 6.5 oz (140.8 kg)      Physical Exam Constitutional:      General: She is not in acute distress.    Appearance: Normal appearance. She is well-developed. She is obese. She is not ill-appearing or toxic-appearing.  HENT:     Head: Normocephalic.     Right Ear: Hearing, tympanic membrane, ear canal and external ear normal. Tympanic membrane is not erythematous, retracted or bulging.     Left Ear: Hearing, tympanic membrane, ear canal and external ear normal. Tympanic membrane is not erythematous, retracted or bulging.     Nose: No mucosal edema or rhinorrhea.     Right Sinus: No maxillary sinus tenderness or frontal sinus tenderness.     Left Sinus: No maxillary sinus tenderness or frontal sinus tenderness.     Mouth/Throat:     Pharynx: Uvula midline.  Eyes:     General: Lids are normal. Lids are everted, no foreign bodies appreciated.     Conjunctiva/sclera: Conjunctivae normal.     Pupils: Pupils are equal, round, and reactive to light.  Neck:     Thyroid : No thyroid  mass or thyromegaly.     Vascular: No carotid bruit.     Trachea: Trachea normal.  Cardiovascular:     Rate and Rhythm: Normal rate and regular rhythm.     Pulses: Normal pulses.     Heart sounds: Normal heart sounds, S1 normal and S2 normal.  No murmur heard.    No friction rub. No gallop.  Pulmonary:     Effort: Pulmonary effort is normal. No tachypnea or respiratory distress.     Breath sounds: Normal breath sounds. No decreased breath sounds, wheezing, rhonchi or rales.  Abdominal:     General: Bowel sounds are normal.     Palpations: Abdomen is soft.     Tenderness: There is no abdominal tenderness.  Musculoskeletal:     Cervical back: Normal range of motion and neck supple.  Skin:    General: Skin is warm and dry.     Findings: No rash.  Neurological:     Mental Status: She is alert.  Psychiatric:        Mood and Affect: Mood is depressed. Mood is not anxious. Affect is labile and tearful.        Speech: Speech normal.        Behavior: Behavior normal. Behavior is cooperative.        Thought Content: Thought content normal.        Judgment: Judgment normal.       Results for orders placed or performed in visit on 06/15/23  HM MAMMOGRAPHY   Collection Time: 06/18/22 12:00 AM  Result Value Ref Range   HM Mammogram 0-4 Bi-Rad 0-4 Bi-Rad, Self Reported Normal  Lipid panel   Collection Time: 06/15/23  1:36 PM  Result Value Ref Range   Cholesterol 194 0 - 200 mg/dL   Triglycerides 161.0 0.0 - 149.0 mg/dL   HDL 96.04 (L) >54.09 mg/dL   VLDL 81.1 0.0 - 91.4 mg/dL   LDL Cholesterol 782 (H) 0 - 99 mg/dL  Total CHOL/HDL Ratio 5    NonHDL 155.42   Comprehensive metabolic panel with GFR   Collection Time: 06/15/23  1:36 PM  Result Value Ref Range   Sodium 140 135 - 145 mEq/L   Potassium 4.6 3.5 - 5.1 mEq/L   Chloride 99 96 - 112 mEq/L   CO2 33 (H) 19 - 32 mEq/L   Glucose, Bld 98 70 - 99 mg/dL   BUN 19 6 - 23 mg/dL   Creatinine, Ser 1.61 0.40 - 1.20 mg/dL   Total Bilirubin 0.8 0.2 - 1.2 mg/dL   Alkaline Phosphatase 158 (H) 39 - 117 U/L   AST 14 0 - 37 U/L   ALT 20 0 - 35 U/L   Total Protein 7.4 6.0 - 8.3 g/dL   Albumin 4.3 3.5 - 5.2 g/dL   GFR 09.60 >45.40 mL/min   Calcium 10.0 8.4 - 10.5 mg/dL  TSH    Collection Time: 06/15/23  1:36 PM  Result Value Ref Range   TSH 4.01 0.35 - 5.50 uIU/mL  T3, free   Collection Time: 06/15/23  1:36 PM  Result Value Ref Range   T3, Free 3.9 2.3 - 4.2 pg/mL  T4, free   Collection Time: 06/15/23  1:36 PM  Result Value Ref Range   Free T4 0.92 0.60 - 1.60 ng/dL  Hemoglobin J8J   Collection Time: 06/15/23  1:36 PM  Result Value Ref Range   Hgb A1c MFr Bld 5.7 4.6 - 6.5 %    Assessment and Plan  MDD (recurrent major depressive disorder) in remission [facility]) Assessment & Plan: Chronic, poor control on no medication.  Will start with referral to counseling. She will follow up  for re-eval mood and CPX in 4 weeks.  Orders: -     Ambulatory referral to Psychology -     Ambulatory referral to Dermatology  Essential hypertension Assessment & Plan: Stable, chronic.  Continue current medication.   losartan  HCTZ  50/12.5 mg daily  Orders: -     Lipid panel -     Comprehensive metabolic panel with GFR  MDD (major depressive disorder), recurrent episode, moderate (HCC) Assessment & Plan: Chronic, poor control on no medication.  Will start with referral to counseling. She will follow up  for re-eval mood and CPX in 4 weeks.   Gastroesophageal reflux disease, unspecified whether esophagitis present Assessment & Plan:  Chronic, using omeprazole  prn.   Hypothyroidism, unspecified type Assessment & Plan:  Chronic .Aaron Aas Due for re-eval.  Orders: -     TSH -     T3, free -     T4, free  Psoriasis  Hand eczema Assessment & Plan: Referred to additional dermatologist for second opinion.   Elevated alkaline phosphatase level Assessment & Plan:  Likely due to fatty liver disease per GI.   Screening for diabetes mellitus (DM) -     Hemoglobin A1c  Menopause syndrome  Class 3 severe obesity due to excess calories with serious comorbidity and body mass index (BMI) of 50.0 to 59.9 in adult Assessment & Plan: Chronic, had severe side effects  to semaglutide  including severe constipation and nausea.  She is now working on healthy eating and regular exercise.  She is food prepping more and eating out less.  Her poor control of her mood is contributing significantly to her weight gain and vice versa.     Return in about 4 weeks (around 07/13/2023) for annual physical  no labs prior.   Herby Lolling, MD

## 2023-06-15 NOTE — Assessment & Plan Note (Signed)
Stable, chronic.  Continue current medication.   losartan HCTZ  50/12.5 mg daily 

## 2023-06-15 NOTE — Assessment & Plan Note (Signed)
 Chronic, using omeprazole  prn.

## 2023-06-15 NOTE — Assessment & Plan Note (Addendum)
 Chronic, poor control on no medication.  Will start with referral to counseling. She will follow up  for re-eval mood and CPX in 4 weeks.

## 2023-06-15 NOTE — Assessment & Plan Note (Signed)
 Chronic .Jamie Mckenzie Due for re-eval.

## 2023-06-15 NOTE — Assessment & Plan Note (Signed)
 Likely due to fatty liver disease per GI.

## 2023-06-16 ENCOUNTER — Other Ambulatory Visit: Payer: Self-pay | Admitting: Family Medicine

## 2023-06-16 DIAGNOSIS — Z1231 Encounter for screening mammogram for malignant neoplasm of breast: Secondary | ICD-10-CM

## 2023-06-23 ENCOUNTER — Telehealth: Payer: Self-pay

## 2023-06-23 ENCOUNTER — Other Ambulatory Visit: Payer: Self-pay | Admitting: Family Medicine

## 2023-06-23 ENCOUNTER — Encounter

## 2023-06-23 ENCOUNTER — Other Ambulatory Visit (HOSPITAL_COMMUNITY): Payer: Self-pay

## 2023-06-23 DIAGNOSIS — Z1231 Encounter for screening mammogram for malignant neoplasm of breast: Secondary | ICD-10-CM

## 2023-06-23 NOTE — Telephone Encounter (Signed)
 Pharmacy Patient Advocate Encounter   Received notification from CoverMyMeds that prior authorization for Zepbound 2.5MG /0.5ML pen-injectors  is required/requested.   Insurance verification completed.   The patient is insured through [facility] .   Per test claim: PA required; PA started via CoverMyMeds. KEY [id number] . Waiting for clinical questions to populate.

## 2023-06-24 NOTE — Telephone Encounter (Signed)
 Clinical questions have been answered and PA submitted.TO PLAN. PA currently Pending.

## 2023-06-25 ENCOUNTER — Ambulatory Visit
Admission: RE | Admit: 2023-06-25 | Discharge: 2023-06-25 | Disposition: A | Discharge status: Home / Self Care | Source: Ambulatory Visit

## 2023-06-25 DIAGNOSIS — Z1231 Encounter for screening mammogram for malignant neoplasm of breast: Secondary | ICD-10-CM

## 2023-06-27 ENCOUNTER — Encounter: Payer: Self-pay | Admitting: Family Medicine

## 2023-06-28 ENCOUNTER — Other Ambulatory Visit: Payer: Self-pay

## 2023-06-28 MED ORDER — TIRZEPATIDE-WEIGHT MANAGEMENT 2.5 MG/0.5ML ~~LOC~~ SOLN: 2.5000 mg | SUBCUTANEOUS | 3 refills | Status: DC

## 2023-06-28 NOTE — Telephone Encounter (Signed)
 Pharmacy Patient Advocate Encounter  Received notification from [facility] that Prior Authorization for Zepbound 2.5MG /0.5ML pen-injectors  has been APPROVED from 06/26/2023 to 01/15/2024  PLEASE BE ADVISED SEE NOTES BELOW ABOUT APPROVAL Message from Plan The request has been approved. The authorization is effective from 06/26/2023 to 01/15/2024, as long as the member is enrolled in their current health plan. The request was approved as submitted. This request is approved for a quantity of 2mL per 28 days.Please note, this medication must be filled through a [facility] or [facility]. For assistance, please call [phone]. A written notification letter will follow with additional details.. Authorization Expiration Date: January 15, 2024.

## 2023-06-30 ENCOUNTER — Other Ambulatory Visit: Payer: Self-pay | Admitting: Family Medicine

## 2023-06-30 DIAGNOSIS — R928 Other abnormal and inconclusive findings on diagnostic imaging of breast: Secondary | ICD-10-CM

## 2023-06-30 NOTE — Assessment & Plan Note (Signed)
 Chronic, had severe side effects to semaglutide  including severe constipation and nausea.  She is now working on healthy eating and regular exercise.  She is food prepping more and eating out less.  Her poor control of her mood is contributing significantly to her weight gain and vice versa.

## 2023-06-30 NOTE — Assessment & Plan Note (Signed)
 Referred to additional dermatologist for second opinion.

## 2023-07-05 MED ORDER — TIRZEPATIDE-WEIGHT MANAGEMENT 2.5 MG/0.5ML ~~LOC~~ SOLN: 2.5000 mg | SUBCUTANEOUS | 0 refills | Status: DC

## 2023-07-14 ENCOUNTER — Other Ambulatory Visit

## 2023-07-16 ENCOUNTER — Ambulatory Visit: Payer: Self-pay | Admitting: Family Medicine

## 2023-07-16 ENCOUNTER — Ambulatory Visit
Admission: RE | Admit: 2023-07-16 | Discharge: 2023-07-16 | Disposition: A | Discharge status: Home / Self Care | Source: Ambulatory Visit | Attending: Family Medicine | Admitting: Family Medicine

## 2023-07-16 ENCOUNTER — Other Ambulatory Visit: Payer: Self-pay | Admitting: Family Medicine

## 2023-07-16 DIAGNOSIS — R928 Other abnormal and inconclusive findings on diagnostic imaging of breast: Secondary | ICD-10-CM

## 2023-07-16 DIAGNOSIS — N6002 Solitary cyst of left breast: Secondary | ICD-10-CM

## 2023-07-22 ENCOUNTER — Ambulatory Visit (INDEPENDENT_AMBULATORY_CARE_PROVIDER_SITE_OTHER): Admitting: Family Medicine

## 2023-07-22 ENCOUNTER — Encounter: Payer: Self-pay | Admitting: Family Medicine

## 2023-07-22 VITALS — BP 128/70 | HR 81 | Temp 98.1°F | Ht 64.5 in | Wt 331.2 lb

## 2023-07-22 DIAGNOSIS — Z6841 Body Mass Index (BMI) 40.0 and over, adult: Secondary | ICD-10-CM

## 2023-07-22 DIAGNOSIS — E66813 Obesity, class 3: Secondary | ICD-10-CM

## 2023-07-22 DIAGNOSIS — E039 Hypothyroidism, unspecified: Secondary | ICD-10-CM

## 2023-07-22 DIAGNOSIS — Z0000 Encounter for general adult medical examination without abnormal findings: Secondary | ICD-10-CM | POA: Diagnosis not present

## 2023-07-22 DIAGNOSIS — I10 Essential (primary) hypertension: Secondary | ICD-10-CM

## 2023-07-22 DIAGNOSIS — F331 Major depressive disorder, recurrent, moderate: Secondary | ICD-10-CM

## 2023-07-22 MED ORDER — TIRZEPATIDE-WEIGHT MANAGEMENT 2.5 MG/0.5ML ~~LOC~~ SOLN: 2.5000 mg | SUBCUTANEOUS | 0 refills | Status: DC

## 2023-07-22 NOTE — Progress Notes (Addendum)
 Patient ID: Jamie Mckenzie, female    DOB: 1970-11-09, 53 y.o.   MRN: [id number]  This visit was conducted in person.  BP 128/70 (BP Location: Left Arm, Patient Position: Sitting, Cuff Size: Large)   Pulse 81   Temp 98.1 F (36.7 C) (Oral)   Ht 5' 4.5" (1.638 m)   Wt (!) 331 lb 4 oz (150.3 kg)   LMP 11/23/2020   SpO2 93%   BMI 55.98 kg/m    CC:  Chief Complaint  Patient presents with   Annual Exam    Subjective:   HPI: Jamie Mckenzie is a 53 y.o. female presenting on 07/22/2023 for Annual Exam    Hypertension:  On losartan  HCTZ  50/12.5 mg daily BP Readings from Last 3 Encounters:  07/22/23 128/70  06/15/23 114/84  01/12/23 (!) 157/81  Using medication without problems or lightheadedness:  none Chest pain with exertion: none Edema: occ at night Short of breath: some Average home BPs: Other issues:    MDD,  good control. Not on any medication.     Hypothyroid :  levo 25 mcg daily  Lab Results  Component Value Date   TSH 4.01 06/15/2023      At last OV started on zepbound  2.5 mg weekly ... Not covered as need  to go thorugh a [facility]. Wt Readings from Last 3 Encounters:  07/22/23 (!) 331 lb 4 oz (150.3 kg)  06/15/23 (!) 330 lb (149.7 kg)  06/05/22 (!) 310 lb (140.6 kg)   Body mass index is 55.98 kg/m.  Lab Results  Component Value Date   CHOL 194 06/15/2023   HDL 38.60 (L) 06/15/2023   LDLCALC 127 (H) 06/15/2023   LDLDIRECT 131.0 12/25/2019   TRIG 142.0 06/15/2023   CHOLHDL 5 06/15/2023   The 10-year ASCVD risk score (Arnett DK, et al., 2019) is: 3.3%   Values used to calculate the score:     Age: 49 years     Sex: Female     Is Non-Hispanic African American: No     Diabetic: No     Tobacco smoker: No     Systolic Blood Pressure: 128 mmHg     Is BP treated: Yes     HDL Cholesterol: 38.6 mg/dL     Total Cholesterol: 194 mg/dL       Relevant past medical, surgical, family and social history reviewed and updated as indicated.  Interim medical history since our last visit reviewed. Allergies and medications reviewed and updated. Outpatient Medications Prior to Visit  Medication Sig Dispense Refill   acetaminophen  (TYLENOL ) 500 MG tablet Take 1,000 mg by mouth every 6 (six) hours as needed for mild pain.     albuterol  (VENTOLIN  HFA) 108 (90 Base) MCG/ACT inhaler Inhale 2 puffs into the lungs every 6 (six) hours as needed for wheezing or shortness of breath.     ELDERBERRY PO Take 1 tablet by mouth daily as needed (immune support).     ibuprofen  (ADVIL ) 200 MG tablet Take 400-800 mg by mouth every 6 (six) hours as needed for headache or mild pain.     levothyroxine  (SYNTHROID ) 25 MCG tablet Take 1 Tablet by mouth once daily. 90 tablet 0   losartan -hydrochlorothiazide  (HYZAAR ) 50-12.5 MG tablet Take 1 tablet by mouth daily. 90 tablet 0   Multiple Vitamins-Minerals (MULTI FOR HER PO) Take 1 tablet by mouth daily.     omeprazole  (PRILOSEC) 20 MG capsule Take 1 capsule (20 mg total) by mouth  daily. 90 capsule 1   Vitamin D , Ergocalciferol , (DRISDOL ) 1.25 MG (50000 UNIT) CAPS capsule Take 1 capsule (50,000 Units total) by mouth once a week. 16 capsule 1   tirzepatide (ZEPBOUND) 2.5 MG/0.5ML injection vial Inject 2.5 mg into the skin once a week. (Patient not taking: Reported on 07/22/2023) 2 mL 0   No facility-administered medications prior to visit.     Per HPI unless specifically indicated in ROS section below Review of Systems  Constitutional:  Negative for fatigue and fever.  HENT:  Negative for congestion.   Eyes:  Negative for pain.  Respiratory:  Negative for cough and shortness of breath.   Cardiovascular:  Negative for chest pain, palpitations and leg swelling.  Gastrointestinal:  Negative for abdominal pain.  Genitourinary:  Negative for dysuria and vaginal bleeding.  Musculoskeletal:  Negative for back pain.  Neurological:  Negative for syncope, light-headedness and headaches.  Psychiatric/Behavioral:   Negative for dysphoric mood.    Objective:  BP 128/70 (BP Location: Left Arm, Patient Position: Sitting, Cuff Size: Large)   Pulse 81   Temp 98.1 F (36.7 C) (Oral)   Ht 5' 4.5" (1.638 m)   Wt (!) 331 lb 4 oz (150.3 kg)   LMP 11/23/2020   SpO2 93%   BMI 55.98 kg/m   Wt Readings from Last 3 Encounters:  07/22/23 (!) 331 lb 4 oz (150.3 kg)  06/15/23 (!) 330 lb (149.7 kg)  06/05/22 (!) 310 lb (140.6 kg)      Physical Exam Constitutional:      General: She is not in acute distress.    Appearance: Normal appearance. She is well-developed. She is obese. She is not ill-appearing or toxic-appearing.  HENT:     Head: Normocephalic.     Right Ear: Hearing, tympanic membrane, ear canal and external ear normal. Tympanic membrane is not erythematous, retracted or bulging.     Left Ear: Hearing, tympanic membrane, ear canal and external ear normal. Tympanic membrane is not erythematous, retracted or bulging.     Nose: No mucosal edema or rhinorrhea.     Right Sinus: No maxillary sinus tenderness or frontal sinus tenderness.     Left Sinus: No maxillary sinus tenderness or frontal sinus tenderness.     Mouth/Throat:     Pharynx: Uvula midline.  Eyes:     General: Lids are normal. Lids are everted, no foreign bodies appreciated.     Conjunctiva/sclera: Conjunctivae normal.     Pupils: Pupils are equal, round, and reactive to light.  Neck:     Thyroid : No thyroid  mass or thyromegaly.     Vascular: No carotid bruit.     Trachea: Trachea normal.  Cardiovascular:     Rate and Rhythm: Normal rate and regular rhythm.     Pulses: Normal pulses.     Heart sounds: Normal heart sounds, S1 normal and S2 normal. No murmur heard.    No friction rub. No gallop.  Pulmonary:     Effort: Pulmonary effort is normal. No tachypnea or respiratory distress.     Breath sounds: Normal breath sounds. No decreased breath sounds, wheezing, rhonchi or rales.  Abdominal:     General: Bowel sounds are normal.      Palpations: Abdomen is soft.     Tenderness: There is no abdominal tenderness.  Musculoskeletal:     Cervical back: Normal range of motion and neck supple.  Skin:    General: Skin is warm and dry.     Findings: No  rash.  Neurological:     Mental Status: She is alert.  Psychiatric:        Mood and Affect: Mood is not anxious or depressed.        Speech: Speech normal.        Behavior: Behavior normal. Behavior is cooperative.        Thought Content: Thought content normal.        Judgment: Judgment normal.       Results for orders placed or performed in visit on 06/15/23  HM MAMMOGRAPHY   Collection Time: 06/18/22 12:00 AM  Result Value Ref Range   HM Mammogram 0-4 Bi-Rad 0-4 Bi-Rad, Self Reported Normal  Lipid panel   Collection Time: 06/15/23  1:36 PM  Result Value Ref Range   Cholesterol 194 0 - 200 mg/dL   Triglycerides 161.0 0.0 - 149.0 mg/dL   HDL 96.04 (L) >54.09 mg/dL   VLDL 81.1 0.0 - 91.4 mg/dL   LDL Cholesterol 782 (H) 0 - 99 mg/dL   Total CHOL/HDL Ratio 5    NonHDL 155.42   Comprehensive metabolic panel with GFR   Collection Time: 06/15/23  1:36 PM  Result Value Ref Range   Sodium 140 135 - 145 mEq/L   Potassium 4.6 3.5 - 5.1 mEq/L   Chloride 99 96 - 112 mEq/L   CO2 33 (H) 19 - 32 mEq/L   Glucose, Bld 98 70 - 99 mg/dL   BUN 19 6 - 23 mg/dL   Creatinine, Ser 9.56 0.40 - 1.20 mg/dL   Total Bilirubin 0.8 0.2 - 1.2 mg/dL   Alkaline Phosphatase 158 (H) 39 - 117 U/L   AST 14 0 - 37 U/L   ALT 20 0 - 35 U/L   Total Protein 7.4 6.0 - 8.3 g/dL   Albumin 4.3 3.5 - 5.2 g/dL   GFR 21.30 >86.57 mL/min   Calcium 10.0 8.4 - 10.5 mg/dL  TSH   Collection Time: 06/15/23  1:36 PM  Result Value Ref Range   TSH 4.01 0.35 - 5.50 uIU/mL  T3, free   Collection Time: 06/15/23  1:36 PM  Result Value Ref Range   T3, Free 3.9 2.3 - 4.2 pg/mL  T4, free   Collection Time: 06/15/23  1:36 PM  Result Value Ref Range   Free T4 0.92 0.60 - 1.60 ng/dL  Hemoglobin Q4O    Collection Time: 06/15/23  1:36 PM  Result Value Ref Range   Hgb A1c MFr Bld 5.7 4.6 - 6.5 %     COVID 19 screen:  No recent travel or known exposure to COVID19 The patient denies respiratory symptoms of COVID 19 at this time. The importance of social distancing was discussed today.   Assessment and Plan   The patient's preventative maintenance and recommended screening tests for an annual wellness exam were reviewed in full today. Brought up to date unless services declined.  Counselled on the importance of diet, exercise, and its role in overall health and mortality. The patient's FH and SH was reviewed, including their home life, tobacco status, and drug and alcohol status.     Vaccines: consider shingrix and flu vaccine ( will do at work) Tdap at work, consider PCV20 PAP: 2021, repeat in 2026 Mammogram 06/2023.. had US  and recommended 6 month repeat US   colonoscopy   11/2021 Dr Brice Campi, repeat 5 years Hep C due Problem List Items Addressed This Visit     Class 3 severe obesity due to excess calories with serious  comorbidity and body mass index (BMI) of 50.0 to 59.9 in adult (Chronic)   Chronic... Not sure what happened with the prescription for zepbound 2.5 mg weekly... This appears to have been sent to the correct self-pay pharmacy but patient was never contacted.  I resent the prescription.  She will let me know if it does not come through.  I will contact the pharmaceutical rep for assistance.      Relevant Medications   tirzepatide (ZEPBOUND) 2.5 MG/0.5ML injection vial   Essential hypertension (Chronic)   Stable, chronic.  Continue current medication.   losartan  HCTZ  50/12.5 mg daily      Hypothyroidism (Chronic)    Chronic .Aaron Aas  Well-controlled on levothyroxine  25 mcg daily      MDD (major depressive disorder), recurrent episode, moderate (HCC)   Chronic, at last OV referred to counseling.      Other Visit Diagnoses       Routine general medical  examination at a health care facility    -  Primary         Herby Lolling, MD

## 2023-07-22 NOTE — Assessment & Plan Note (Signed)
 Chronic, at last OV referred to counseling.

## 2023-07-22 NOTE — Assessment & Plan Note (Addendum)
 Chronic... Not sure what happened with the prescription for zepbound 2.5 mg weekly... This appears to have been sent to the correct self-pay pharmacy but patient was never contacted.  I resent the prescription.  She will let me know if it does not come through.  I will contact the pharmaceutical rep for assistance.

## 2023-07-22 NOTE — Assessment & Plan Note (Addendum)
 Chronic .Aaron Aas  Well-controlled on levothyroxine  25 mcg daily

## 2023-07-22 NOTE — Assessment & Plan Note (Signed)
Stable, chronic.  Continue current medication.   losartan HCTZ  50/12.5 mg daily 

## 2023-08-05 ENCOUNTER — Other Ambulatory Visit (HOSPITAL_COMMUNITY): Payer: Self-pay

## 2023-08-06 ENCOUNTER — Other Ambulatory Visit (HOSPITAL_COMMUNITY): Payer: Self-pay

## 2023-08-06 ENCOUNTER — Encounter: Payer: Self-pay | Admitting: Family Medicine

## 2023-08-06 MED ORDER — LEVOTHYROXINE SODIUM 25 MCG PO TABS: 25.0000 ug | ORAL_TABLET | Freq: Every day | ORAL | 3 refills | Status: AC

## 2023-08-06 MED ORDER — LOSARTAN POTASSIUM-HCTZ 50-12.5 MG PO TABS: 1.0000 | ORAL_TABLET | Freq: Every day | ORAL | 0 refills | Status: DC

## 2023-08-06 MED ORDER — LEVOTHYROXINE SODIUM 25 MCG PO TABS: 25.0000 ug | ORAL_TABLET | Freq: Every day | ORAL | 0 refills | Status: DC

## 2023-08-06 MED ORDER — LOSARTAN POTASSIUM-HCTZ 50-12.5 MG PO TABS: 1.0000 | ORAL_TABLET | Freq: Every day | ORAL | 3 refills | Status: AC

## 2023-08-09 ENCOUNTER — Other Ambulatory Visit: Payer: Self-pay | Admitting: Family Medicine

## 2023-08-10 ENCOUNTER — Ambulatory Visit (INDEPENDENT_AMBULATORY_CARE_PROVIDER_SITE_OTHER): Admitting: Clinical

## 2023-08-10 DIAGNOSIS — F4321 Adjustment disorder with depressed mood: Secondary | ICD-10-CM

## 2023-08-10 NOTE — Progress Notes (Signed)
   Doree Barthel, LCSW

## 2023-08-10 NOTE — Telephone Encounter (Signed)
 Last CPE 07/22/23.  Last refill 07/22/23 2mL w/ 0 refills  NOV: Nothing scheduled

## 2023-08-10 NOTE — Progress Notes (Signed)
 [facility] Counselor Initial Adult Exam  Name: Jamie Mckenzie Date: 08/10/2023 MRN: [id number] DOB: 06/29/1970 PCP: Judithann Novas, MD  Time spent: 12:35pm - 1:13pm  Guardian/Payee:  NA    Paperwork requested: NA  Reason for Visit /Presenting Problem: Patient stated, I've had some changes in my life in the last couple of years and it was not according to my plan, I have become a little bit complacent, angry, sad, just very sad.   Mental Status Exam: Appearance:   Neat and Well Groomed     Behavior:  Appropriate  Motor:  Normal  Speech/Language:   Clear and Coherent and Normal Rate  Affect:  Tearful  Mood:  sad  Thought process:  normal  Thought content:    WNL  Sensory/Perceptual disturbances:    WNL  Orientation:  oriented to person, place, situation, day of week, month of year, and year  Attention:  Good  Concentration:  Good  Memory:  WNL  Fund of knowledge:   Good  Insight:    Good  Judgment:   Good  Impulse Control:  Good   Reported Symptoms:  Patient reported recent weight gain, increased appetite, difficulty falling asleep and staying asleep, decreased energy, prefers to stay in bed, fatigue, loss of interest, psychomotor retardation, decreased concentration at home, anger, difficulty recalling words and reported is able to recall the word later. Patient reported symptoms started after an incident occurred at work approximately 2 years ago. Patient reported a history of depressive symptoms in response to stressors.   Risk Assessment: Danger to Self:  No Patient denied current and past suicidal ideation and symptoms of psychosis Self-injurious Behavior: No Danger to Others: No Patient denied current and past homicidal ideation Duty to Warn:no Physical Aggression / Violence:No  Access to Firearms a concern: No  Gang Involvement:No  Patient / guardian was educated about steps to take if suicide or homicide risk level increases between visits:  yes While future psychiatric events cannot be accurately predicted, the patient does not currently require acute inpatient psychiatric care and does not currently meet [place]  involuntary commitment criteria.  Substance Abuse History: Current substance abuse: No   Patient reported a history of tobacco use, daily, with last use approximately 1 month ago. Patient reported alcohol use of 1-2 drinks with dinner occasionally. Patient denied current and past drug use.   Past Psychiatric History:   Previous psychological history is significant for depression prior to incident at work Outpatient Providers: patient reported a history of individual therapy with [facility] in [place]  History of Psych Hospitalization: No  Psychological Testing: none   Abuse History:  Victim of: No., none   Report needed: No. Victim of Neglect:No. 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  Family History:  Family History  Problem Relation Age of Onset   Hypertension Mother    Arthritis Mother    Obesity Mother        morbid   Myasthenia gravis Mother    Cancer Father        non hodgkins lymphoma   Breast cancer Maternal Aunt    Cancer Maternal Grandmother        lung   Heart disease Maternal Grandfather        heart attack   COPD Paternal Grandfather  Colon cancer Neg Hx    Esophageal cancer Neg Hx    Inflammatory bowel disease Neg Hx    Liver disease Neg Hx    Pancreatic cancer Neg Hx    Rectal cancer Neg Hx    Stomach cancer Neg Hx     Living situation: the patient lives alone  Sexual Orientation: Straight  Relationship Status: single  Name of spouse / other: NA If a parent, number of children / ages: none  Support Systems: younger sister, friend in [place] , local friends  Financial  Stress:  No   Income/Employment/Disability: Employment full time in emergency Radiation protection practitioner Service: No   Educational History: Education: Risk manager: [employer]  Any cultural differences that may affect / interfere with treatment:  not applicable   Recreation/Hobbies: reading, activities with friends, music bingo, movies  Stressors: Loss of job, loss of ability to see friends at previous place of employment, pending court case    Strengths: Friends  Barriers:  Patient stated, these feelings that I've got   Legal History: Pending legal issue / charges: pending court case related to situation at previous place of employment . History of legal issue / charges: none  Medical History/Surgical History: reviewed Past Medical History:  Diagnosis Date   Abrasion of right wrist 04/15/2012   Arthritis    ankles   Cholecystitis, acute 02/2021   Cough 04/15/2012   Dental crowns present    GERD (gastroesophageal reflux disease)    Hypertension    Hypothyroidism    Morbid obesity (HCC)    Perimenopausal 03/26/2012   irregular periods   Retained orthopedic hardware 03/26/2012   left ankle   Stuffy and runny nose    clear drainage from nose - URI onset 03/30/2012    Past Surgical History:  Procedure Laterality Date   BILIARY STENT PLACEMENT  03/26/2021   Procedure: BILIARY STENT PLACEMENT;  Surgeon: Normie Becton., MD;  Location: [facility] ENDOSCOPY;  Service: Gastroenterology;;   BILIARY STENT PLACEMENT N/A 07/16/2021   Procedure: BILIARY STENT REMOVAL;  Surgeon: Normie Becton., MD;  Location: [facility] ENDOSCOPY;  Service: Gastroenterology;  Laterality: N/A;   BIOPSY  07/16/2021   Procedure: BIOPSY;  Surgeon: Normie Becton., MD;  Location: [facility] ENDOSCOPY;  Service: Gastroenterology;;   BREAST BIOPSY Left 10/23/2014   Benign   CHOLECYSTECTOMY N/A 03/17/2021   Procedure: LAPAROSCOPIC CHOLECYSTECTOMY WITH ICG;  Surgeon: Enid Harry, MD;  Location: [facility] OR;  Service: General;  Laterality: N/A;   COLONOSCOPY WITH PROPOFOL  N/A 12/17/2021   Procedure: COLONOSCOPY WITH PROPOFOL ;  Surgeon: Normie Becton., MD;  Location: [facility] ENDOSCOPY;  Service: Gastroenterology;  Laterality: N/A;   DILATION AND EVACUATION  11/15/2008   ENDOSCOPIC RETROGRADE CHOLANGIOPANCREATOGRAPHY (ERCP) WITH PROPOFOL  N/A 03/26/2021   Procedure: ENDOSCOPIC RETROGRADE CHOLANGIOPANCREATOGRAPHY (ERCP) WITH PROPOFOL ;  Surgeon: Brice Campi Albino Alu., MD;  Location: [facility] ENDOSCOPY;  Service: Gastroenterology;  Laterality: N/A;   ENDOSCOPIC RETROGRADE CHOLANGIOPANCREATOGRAPHY (ERCP) WITH PROPOFOL  N/A 07/16/2021   Procedure: ENDOSCOPIC RETROGRADE CHOLANGIOPANCREATOGRAPHY (ERCP) WITH PROPOFOL ;  Surgeon: Brice Campi Albino Alu., MD;  Location: [facility] ENDOSCOPY;  Service: Gastroenterology;  Laterality: N/A;   HARDWARE REMOVAL Left 04/21/2012   Procedure: HARDWARE REMOVAL;  Surgeon: Amada Backer, MD;  Location: [facility];  Service: Orthopedics;  Laterality: Left;  LEFT ANKLE HARDWARE REMOVAL   HEMOSTASIS CLIP PLACEMENT  12/17/2021   Procedure: HEMOSTASIS CLIP PLACEMENT;  Surgeon: Normie Becton., MD;  Location: [facility] ENDOSCOPY;  Service: Gastroenterology;;   HYSTEROSCOPY WITH D & C  10/24/2009   with polypectomy   MULTIPLE TOOTH EXTRACTIONS     ORIF ANKLE FRACTURE Right 1985   ORIF ANKLE FRACTURE  11/03/2011   Procedure: OPEN REDUCTION INTERNAL FIXATION (ORIF) ANKLE FRACTURE;  Surgeon: Amada Backer, MD;  Location: [facility];  Service: Orthopedics;  Laterality: Left;  Open Reduction Internal Fixation left ankle lateral malleolus fracture, left syndesmosis   POLYPECTOMY  12/17/2021   Procedure: POLYPECTOMY;  Surgeon: Mansouraty, Albino Alu., MD;  Location: [facility] ENDOSCOPY;  Service: Gastroenterology;;   REMOVAL OF STONES  03/26/2021   Procedure: REMOVAL OF STONES;  Surgeon: Normie Becton., MD;  Location[facility] ENDOSCOPY;  Service:  Gastroenterology;;   REMOVAL OF STONES  07/16/2021   Procedure: REMOVAL OF SLUDGE;  Surgeon: Normie Becton., MD;  Location: [facility] ENDOSCOPY;  Service: Gastroenterology;;   [address]  03/26/2021   Procedure: [address];  Surgeon: Normie Becton., MD;  Location: [facility] ENDOSCOPY;  Service: Gastroenterology;;    Medications: Current Outpatient Medications  Medication Sig Dispense Refill   acetaminophen  (TYLENOL ) 500 MG tablet Take 1,000 mg by mouth every 6 (six) hours as needed for mild pain.     albuterol  (VENTOLIN  HFA) 108 (90 Base) MCG/ACT inhaler Inhale 2 puffs into the lungs every 6 (six) hours as needed for wheezing or shortness of breath.     ELDERBERRY PO Take 1 tablet by mouth daily as needed (immune support).     ibuprofen  (ADVIL ) 200 MG tablet Take 400-800 mg by mouth every 6 (six) hours as needed for headache or mild pain.     levothyroxine  (SYNTHROID ) 25 MCG tablet Take 1 tablet (25 mcg total) by mouth daily. 90 tablet 3   losartan -hydrochlorothiazide  (HYZAAR ) 50-12.5 MG tablet Take 1 tablet by mouth daily. 90 tablet 3   Multiple Vitamins-Minerals (MULTI FOR HER PO) Take 1 tablet by mouth daily.     omeprazole  (PRILOSEC) 20 MG capsule Take 1 capsule (20 mg total) by mouth daily. 90 capsule 1   Vitamin D , Ergocalciferol , (DRISDOL ) 1.25 MG (50000 UNIT) CAPS capsule Take 1 capsule (50,000 Units total) by mouth once a week. 16 capsule 1   ZEPBOUND  2.5 MG/0.5ML injection vial INJECT 0.5 ML (2.5 MG) UNDER THE SKIN ONCE WEEKLY (0.5ML= 50 UNITS) 2 mL 0   No current facility-administered medications for this visit.    No Known Allergies  Diagnoses:  Adjustment disorder with depressed mood  Plan of Care: Patient is a 53 year old female who presented for an initial assessment. Clinician conducted initial assessment in person from clinician's office at [facility]. Patient reported the following symptoms: recent weight gain, increased appetite, difficulty falling  asleep and staying asleep, decreased energy, prefers to stay in bed, fatigue, loss of interest, psychomotor retardation, decreased concentration when at home, anger, difficulty recalling words and reported is able to recall the word later. Patient reported symptoms started after an incident occurred at work approximately 2 years ago. Patient reported a history of depressive symptoms in response to stressors. Patient denied current and past suicidal ideation, homicidal ideation, and symptoms of psychosis. Patient reported alcohol use of 1-2 drinks with dinner occasionally. Patient denied current and past drug use. Patient reported a history of daily tobacco use. Patient reported a history of participation in individual therapy. Patient reported no history of psychiatric hospitalizations. Patient reported the loss of patient's job, loss of patient's ability to see friends at patient's previous place of employment, and a pending court case are current stressors. Patient reported patient's younger sister  and friends are current supports. It is recommended patient be referred to a psychiatrist for a medication management consult and recommended patient participate in individual therapy every two weeks. Clinician will review recommendations and treatment plan with patient during follow up appointment. Treatment plan will be developed during follow up appointment.   Collaboration of Care: Primary Care Provider AEB Patient requested to complete a consent for patient's PCP, Dr. Herby Lolling at [facility]  Patient/Guardian was advised Release of Information must be obtained prior to any record release in order to collaborate their care with an outside provider. Patient/Guardian was advised if they have not already done so to contact [facility] to sign all necessary forms in order for us  to release information regarding their care.   Consent: Patient/Guardian gives verbal consent for treatment  and assignment of benefits for services provided during this visit. Patient/Guardian expressed understanding and agreed to proceed.   Burlene Carpen, LCSW

## 2023-08-31 ENCOUNTER — Ambulatory Visit (INDEPENDENT_AMBULATORY_CARE_PROVIDER_SITE_OTHER): Admitting: Clinical

## 2023-08-31 DIAGNOSIS — F4321 Adjustment disorder with depressed mood: Secondary | ICD-10-CM | POA: Diagnosis not present

## 2023-08-31 NOTE — Progress Notes (Signed)
 [facility] Counselor/Therapist Progress Note  Patient ID: Jamie Mckenzie, MRN: [id number]    Date: 08/31/23  Time Spent: 10:35  am - 11:27 am : 52 Minutes  Treatment Type: Individual Therapy.  Reported Symptoms: anger, crying, decreased motivation  Mental Status Exam: Appearance:  Neat and Well Groomed     Behavior: Appropriate  Motor: Normal  Speech/Language:  Clear and Coherent and Normal Rate  Affect: Tearful  Mood: depressed  Thought process: normal  Thought content:   WNL  Sensory/Perceptual disturbances:   WNL  Orientation: oriented to person, place, time/date, and situation  Attention: Good  Concentration: Good  Memory: WNL  Fund of knowledge:  Good  Insight:   Good  Judgment:  Good  Impulse Control: Good   Risk Assessment: Danger to Self:  No Patient denied current suicidal ideation  Self-injurious Behavior: No Danger to Others: No Patient denied current homicidal ideation Duty to Warn:no Physical Aggression / Violence:No  Access to Firearms a concern: No  Gang Involvement:No   Subjective:  Patient stated, pretty much the same in response to events since last session. Patient stated, I'm up in response to patient's current mood.  Patient stated, I feel fine with that in response to consultation with a psychiatrist. Patient stated, I requested the therapy in response to participation in therapy. Patient stated, id like to get back to where I was two years ago in response to goals for therapy. Patient stated,  I enjoyed going to work, I enjoyed doing things at home, performed daily activities, stated my house was clean, and experienced increased motivation to complete activities in reference to patient's activities two years ago. Patient stated, eating better, lose weight, not be so emotional, not get angry at the drop of a hat and not cry at the drop of a hat, thoughts of revenge I would like to get that out of my head in  response to goals for therapy. Patient reported patient has considered writing a letter to previous employer in response to clinician's clarification of patient's statement regarding revenge. Patient stated, I go to work, that's the only thing that gets me out of bed, out of the house.   Interventions: Motivational Interviewing. Clinician conducted session in person at clinician's office at [facility]. Reviewed events since last session and assessed for changes. Clinician reviewed diagnosis and treatment recommendations. Provided psycho education related to diagnosis and treatment. Clinician utilized motivational interviewing to explore potential goals for therapy. Clinician utilized a task centered approach in collaboration with patient to develop goals for therapy. Patient participated in development of goals and agreed to goals for therapy.   Collaboration of Care: Other Discussed consent for referral to a psychiatrist  Patient/Guardian was advised Release of Information must be obtained prior to any record release in order to collaborate their care with an outside provider. Patient/Guardian was advised if they have not already done so to contact [facility] to sign all necessary forms in order for us  to release information regarding their care.   Consent: Patient/Guardian gives verbal consent for treatment and assignment of benefits for services provided during this visit. Patient/Guardian expressed understanding and agreed to proceed.   Diagnosis:  Adjustment disorder with depressed mood   Plan: Patient is to utilize [employer] [facility], thought re-framing, relaxation techniques, mindfulness, behavioral activation, and coping strategies to decrease symptoms associated with their diagnosis. Frequency: bi-weekly  Modality: individual     Long-term goal:   Reduce overall level, frequency, and intensity  of the feelings of depression as evidenced by decrease in  lack of appetite, difficulty falling asleep and staying asleep, lack of energy, staying in bed, fatigue, loss of interest, psychomotor retardation, difficulty concentrating when at home, and anger from 7 days/week to 1 to 2 days/week per patient report for at least 3 consecutive months. Target Date: 08/30/24  Progress: established 08/31/23   Short-term goal:  Begin healthy grieving process in regards to the loss of patient's job and friendships Target Date: 08/30/24  Progress: established 08/31/23   Increase use of self care strategies, such as, making healthy dietary choices, pedicures, music bingo, dinner with friends, reading from 1 times per week to 3 times per week Target Date: 08/30/24  Progress: established 08/31/23   Increase use of healthy coping strategies from 1 times per week to 6-7 times a day Target Date: 08/30/24  Progress: established 08/31/23   Decrease feelings of anger/irritability and sadness through use of healthy coping strategies 6-7 times per week  Target Date: 08/30/24  Progress: established 08/31/23   Identify, challenge, and replace negative thought patterns that contribute to feelings of depression and feelings associated with patient's previous employer with positive thoughts, beliefs, and positive self talk per patient's report Target Date: 08/30/24  Progress: established 08/31/23                      Darice Seats, LCSW

## 2023-09-28 ENCOUNTER — Ambulatory Visit: Admitting: Clinical

## 2023-09-28 DIAGNOSIS — F4321 Adjustment disorder with depressed mood: Secondary | ICD-10-CM

## 2023-09-28 NOTE — Progress Notes (Signed)
   Jamie Barthel, LCSW

## 2023-09-28 NOTE — Progress Notes (Signed)
 [facility] Counselor/Therapist Progress Note  Patient ID: Jamie Mckenzie, MRN: [id number],    Date: 09/28/2023  Time Spent: 1:35pm - 2:25pm : 50 minutes   Treatment Type: Individual Therapy  Reported Symptoms: fatigue, increased sleep, decreased motivation  Mental Status Exam: Appearance:  Neat and Well Groomed     Behavior: Appropriate  Motor: Normal  Speech/Language:  Clear and Coherent and Normal Rate  Affect: Appropriate  Mood: normal  Thought process: normal  Thought content:   WNL  Sensory/Perceptual disturbances:   WNL  Orientation: oriented to person, place, time/date, and situation  Attention: Good  Concentration: Good  Memory: WNL  Fund of knowledge:  Good  Insight:   Good  Judgment:  Good  Impulse Control: Good   Risk Assessment: Danger to Self:  No Patient denied current suicidal ideation  Self-injurious Behavior: No Danger to Others: No Patient denied current homicidal ideation Duty to Warn:no Physical Aggression / Violence:No  Access to Firearms a concern: No  Gang Involvement:No   Subjective: Patient stated ok in response to events since last session. Patient stated, I did a couple of things and reported completing household tasks, visited with a friend, attended a work meeting. Patient stated, it was the most challenging because I haven't been to a work thing in probably 3-4 months in reference to recent work meeting. Patient reported a friend encouraged patient to attend the work meeting and invited patient to breakfast with other friends. Patient stated, at breakfast I was like I could easily get back on the highway and come back home, I went and actually enjoyed myself at the meeting. Patient reported after the meeting patient visited a friends house and went to dinner with friend. Patient stated, It was fine, I enjoy myself when I'm out, its just getting there. Patient stated, stuff to do at home, I don't want to get out  of bed, increased sleep, and fatigue due to patient's schedule are barriers to participation in activities. 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 Patient reported patient works 12 hour shifts at night. Patient stated, It's not as stressful, its a good place to learn its a good place to work, theres a lot of cliques in reference to patient's current work environment. Patient reported patient currently receives a pedicure/manicure once per month. Patient stated,  I think reading in response to a self care strategy patient would like to implement. Patient stated,  I'm alright, I'm not down in response to current mood.   Interventions: Cognitive Behavioral Therapy. Clinician conducted session in person at clinician's office at [facility]. Reviewed events since last session and assessed for changes. Discussed recent activity level. explored motivation for attending work meeting and outcome of the meeting. Explored and identified barriers to participation in activities. Discussed patient's current work schedule and the impact on patient. Discussed patient's work Architect. Discussed patient's thoughts and feelings associated with current work environment. Provided psycho education related to behavioral activation and dividing goals into smaller steps. Provided psycho education related to self care. Explored and identified current self care routine. Assisted patient in dividing goal of self care into smaller steps, such as, choosing a book to read, placing book in patient's chair, scheduling self care, and reading 10 minutes per day on patient's day off.   Collaboration of Care: Other Discussed consent for referral to a psychiatrist   Patient/Guardian was advised Release of Information must be  obtained prior to any record release in order to collaborate their care with an outside provider. Patient/Guardian was  advised if they have not already done so to contact [employer] [facility] to sign all necessary forms in order for us  to release information regarding their care.    Consent: Patient/Guardian gives verbal consent for treatment and assignment of benefits for services provided during this visit. Patient/Guardian expressed understanding and agreed to proceed.    Diagnosis:  Adjustment disorder with depressed mood     Plan: Patient is to utilize [employer] Therapy, thought re-framing, relaxation techniques, mindfulness, behavioral activation, and coping strategies to decrease symptoms associated with their diagnosis. Frequency: bi-weekly  Modality: individual      Long-term goal:   Reduce overall level, frequency, and intensity of the feelings of depression as evidenced by decrease in lack of appetite, difficulty falling asleep and staying asleep, lack of energy, staying in bed, fatigue, loss of interest, psychomotor retardation, difficulty concentrating when at home, and anger from 7 days/week to 1 to 2 days/week per patient report for at least 3 consecutive months. Target Date: 08/30/24  Progress: progressing    Short-term goal:  Begin healthy grieving process in regards to the loss of patient's job and friendships Target Date: 08/30/24  Progress: progressing    Increase use of self care strategies, such as, making healthy dietary choices, pedicures, music bingo, dinner with friends, reading from 1 times per week to 3 times per week Target Date: 08/30/24  Progress: progressing    Increase use of healthy coping strategies from 1 times per week to 6-7 times a day Target Date: 08/30/24  Progress: progressing    Decrease feelings of anger/irritability and sadness through use of healthy coping strategies 6-7 times per week  Target Date: 08/30/24  Progress: progressing    Identify, challenge, and replace negative thought patterns that contribute to feelings of depression and feelings  associated with patient's previous employer with positive thoughts, beliefs, and positive self talk per patient's report Target Date: 08/30/24  Progress: progressing                                Darice Seats, LCSW

## 2023-10-14 ENCOUNTER — Ambulatory Visit: Admitting: Clinical

## 2023-10-14 DIAGNOSIS — F4321 Adjustment disorder with depressed mood: Secondary | ICD-10-CM

## 2023-10-14 NOTE — Progress Notes (Signed)
 [facility] Counselor/Therapist Progress Note  Patient ID: Jamie Mckenzie, MRN: [id number],    Date: 10/14/2023  Time Spent: 8:37am - 9:28am : 51 minutes   Treatment Type: Individual Therapy  Reported Symptoms: intrusive thoughts at times  Mental Status Exam: Appearance:  Neat and Well Groomed     Behavior: Patient reported feeling tired  Motor: Normal  Speech/Language:  Clear and Coherent and Normal Rate  Affect: Appropriate  Mood: normal  Thought process: normal  Thought content:   WNL  Sensory/Perceptual disturbances:   WNL  Orientation: oriented to person, place, time/date, and situation  Attention: Good  Concentration: Good  Memory: WNL  Fund of knowledge:  Good  Insight:   Good  Judgment:  Good  Impulse Control: Good   Risk Assessment: Danger to Self:  No  Patient denied current suicidal ideation  Self-injurious Behavior: No Danger to Others: No Patient denied current homicidal ideation Duty to Warn:no Physical Aggression / Violence:No  Access to Firearms a concern: No  Gang Involvement:No   Subjective: Patient stated, alright in response to events since last session. Patient reported patient has attended bingo and spent time with a friend since last session. Patient stated, I picked a book, I have not read any pages and reported patient's increase in work schedule has been a barrier to reading. Patient stated, Its been ok in response to mood since last session. Patient reported feeling tired today due to recent work schedule. Patient stated, its alright in response to current mood. Patient reported patient is scheduled for a massage next week. Patient reported patient would like to get a hair cut and pedicure while on vacation this week. In addition, patient reported patient would like to visit a friend at the beach while on vacation and read patient's book. Patient reported patient receives a 30 minute lunch and two 15 minute breaks at work.  Patient reported patient does not utilize 15 minute breaks due to the needs of other employees. Patient reported patient practices deep breathing exercises in response to stressors. Patient reported patient has practiced imagery of the beach in the past.  Patient reported reminders of relationships with individuals at patient's previous place of employment and reminders of previous place of employment are triggers for anger/irritability.   Interventions: Cognitive Behavioral Therapy. Clinician conducted session in person at clinician's office at [facility] Reviewed events since last session and assessed for changes. Reviewed patient's participation in positive activities and implementation of self care since last session. Discussed recent barriers to self care. Discussed self care strategies patient would like to implement while on vacation this week. Explored patient's work schedule and opportunities for self care during work day. Reviewed coping strategies patient practices in response to stressors. Provided psycho education related to imagery and mindfulness exercise of the 5 senses. Explored triggers for anger/irritability and thoughts associated with anger/irritability. Clinician requested for homework patient complete a thought record.    Collaboration of Care: not required at this time   Diagnosis:  Adjustment disorder with depressed mood     Plan: Patient is to utilize [employer], thought re-framing, relaxation techniques, mindfulness, behavioral activation, and coping strategies to decrease symptoms associated with their diagnosis. Frequency: bi-weekly  Modality: individual      Long-term goal:   Reduce overall level, frequency, and intensity of the feelings of depression as evidenced by decrease in lack of appetite, difficulty falling asleep and staying asleep, lack of energy, staying in bed, fatigue, loss of interest, psychomotor  retardation, difficulty  concentrating when at home, and anger from 7 days/week to 1 to 2 days/week per patient report for at least 3 consecutive months. Target Date: 08/30/24  Progress: progressing    Short-term goal:  Begin healthy grieving process in regards to the loss of patient's job and friendships Target Date: 08/30/24  Progress: progressing    Increase use of self care strategies, such as, making healthy dietary choices, pedicures, music bingo, dinner with friends, reading from 1 times per week to 3 times per week Target Date: 08/30/24  Progress: progressing    Increase use of healthy coping strategies from 1 times per week to 6-7 times a day Target Date: 08/30/24  Progress: progressing    Decrease feelings of anger/irritability and sadness through use of healthy coping strategies 6-7 times per week  Target Date: 08/30/24  Progress: progressing    Identify, challenge, and replace negative thought patterns that contribute to feelings of depression and feelings associated with patient's previous employer with positive thoughts, beliefs, and positive self talk per patient's report Target Date: 08/30/24  Progress: progressing    Darice Seats, LCSW

## 2023-10-14 NOTE — Progress Notes (Signed)
   Darice Seats, LCSW

## 2023-10-27 ENCOUNTER — Ambulatory Visit (INDEPENDENT_AMBULATORY_CARE_PROVIDER_SITE_OTHER): Admitting: Clinical

## 2023-10-27 DIAGNOSIS — F4321 Adjustment disorder with depressed mood: Secondary | ICD-10-CM | POA: Diagnosis not present

## 2023-10-27 NOTE — Progress Notes (Signed)
 [facility] Counselor/Therapist Progress Note  Patient ID: Jamie Mckenzie, MRN: [id number],    Date: 10/27/2023  Time Spent: 12:35pm - 1:20pm : 45 minutes   Treatment Type: Individual Therapy  Reported Symptoms: decreased motivation  Mental Status Exam: Appearance:  Neat and Well Groomed     Behavior: Appropriate  Motor: Normal  Speech/Language:  Clear and Coherent and Normal Rate  Affect: Appropriate  Mood: normal  Thought process: normal  Thought content:   WNL  Sensory/Perceptual disturbances:   WNL  Orientation: oriented to person, place, time/date, and situation  Attention: Good  Concentration: Good  Memory: WNL  Fund of knowledge:  Good  Insight:   Good  Judgment:  Good  Impulse Control: Good   Risk Assessment: Danger to Self:  No Patient denied current suicidal ideation  Self-injurious Behavior: No Danger to Others: No Patient denied current homicidal ideation Duty to Warn:no  Physical Aggression / Violence:No  Access to Firearms a concern: No  Gang Involvement:No   Subjective: Patient stated, they've been going good in response to events since last session. Patient reported patient had a haircut, a massage, went to dinner with friends, and stated, took it easy at home during recent vacation. Patient stated, I feel alright in response to vacation. Patient reported plans to take an annual trip to [place]  in October. Patient stated, mentally a little bit better in response to patient's mood since last session. Patient stated, now I just need to get up and do things, the stamina to stand and do things. Patient stated, I think I've been deconditioned in reference to activities. Patient reported pain, weight, previous work schedule, fatigue, and lack of energy are barriers to performing activities. Patient stated, I would like to get up and walk, be able to do an hour of yoga. Patient reported patient previously used a fit bit to track patient's  activity and attended yoga with friends. Patient stated,  I wanted to hit 10,000 steps in reference to previous goal. Patient stated, I would like to get up and be able to move well. During today's session, patient identified the following steps towards patient's goal: purchase step tracker, getting dressed and putting sneakers on each day, finding yoga gear, asking a friend about attending yoga with patient, getting into the car, driving to yoga studio, getting out of car, going in to yoga studio, attending yoga class. 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 Patient identified a goal to obtain 1000 steps each day, add 10 additional steps daily and add 10 additional steps to daily goal each week. Patient stated, sounds good in response to behavioral activation goals/steps. Patient stated, its pretty good in response to current mood. Patient reported decreased motivation to complete household chores.   Interventions: Cognitive Behavioral Therapy. Clinician conducted session in person at clinician's office at [facility]. Reviewed events since last session and assessed for changes. Discussed patient's vacation. Reviewed patient's implementation of self care during vacation. Explored and identified barriers to physical activity. Provided psycho education related to behavioral activation. Explored changes in patient's activity level.  Assisted patient in identifying behavioral activation goal and dividing goal to increase movement and obtain 5,000 steps per day into smaller steps. Clinician requested for homework patient complete a thought record and practice behavioral activation steps.    Collaboration of Care: not required at  this time   Diagnosis:  Adjustment disorder with depressed mood     Plan: Patient is to utilize [employer] Therapy, thought  re-framing, relaxation techniques, mindfulness, behavioral activation, and coping strategies to decrease symptoms associated with their diagnosis. Frequency: bi-weekly  Modality: individual      Long-term goal:   Reduce overall level, frequency, and intensity of the feelings of depression as evidenced by decrease in lack of appetite, difficulty falling asleep and staying asleep, lack of energy, staying in bed, fatigue, loss of interest, psychomotor retardation, difficulty concentrating when at home, and anger from 7 days/week to 1 to 2 days/week per patient report for at least 3 consecutive months. Target Date: 08/30/24  Progress: progressing    Short-term goal:  Begin healthy grieving process in regards to the loss of patient's job and friendships Target Date: 08/30/24  Progress: progressing    Increase use of self care strategies, such as, making healthy dietary choices, pedicures, music bingo, dinner with friends, reading from 1 times per week to 3 times per week Target Date: 08/30/24  Progress: progressing    Increase use of healthy coping strategies from 1 times per week to 6-7 times a day Target Date: 08/30/24  Progress: progressing    Decrease feelings of anger/irritability and sadness through use of healthy coping strategies 6-7 times per week  Target Date: 08/30/24  Progress: progressing    Identify, challenge, and replace negative thought patterns that contribute to feelings of depression and feelings associated with patient's previous employer with positive thoughts, beliefs, and positive self talk per patient's report Target Date: 08/30/24  Progress: progressing     Darice Seats, LCSW

## 2023-10-27 NOTE — Progress Notes (Signed)
   Darice Seats, LCSW

## 2023-11-17 ENCOUNTER — Ambulatory Visit (INDEPENDENT_AMBULATORY_CARE_PROVIDER_SITE_OTHER): Admitting: Clinical

## 2023-11-17 DIAGNOSIS — F4321 Adjustment disorder with depressed mood: Secondary | ICD-10-CM

## 2023-11-17 NOTE — Progress Notes (Signed)
   Darice Seats, LCSW

## 2023-11-17 NOTE — Progress Notes (Signed)
 [facility] Counselor/Therapist Progress Note  Patient ID: Jamie Mckenzie, MRN: [id number],    Date: 11/17/2023  Time Spent: 8:35am - 9:22am : 47 minutes  Treatment Type: Individual Therapy  Reported Symptoms: decreased motivation  Mental Status Exam: Appearance:  Neat and Well Groomed     Behavior: Appropriate  Motor: Normal  Speech/Language:  Clear and Coherent and Normal Rate  Affect: Tearful when discussing grief  Mood: normal  Thought process: normal  Thought content:   WNL  Sensory/Perceptual disturbances:   WNL  Orientation: oriented to person, place, time/date, and situation  Attention: Good  Concentration: Good  Memory: WNL  Fund of knowledge:  Good  Insight:   Good  Judgment:  Good  Impulse Control: Good   Risk Assessment: Danger to Self:  No Patient denied current suicidal ideation  Self-injurious Behavior: No Danger to Others: No Patient denied current homicidal ideation Duty to Warn:no Physical Aggression / Violence:No  Access to Firearms a concern: No  Gang Involvement:No   Subjective: Patient stated, going ok in response to events since last session. Patient reported patient purchased a step tracking device and reported patient has been achieving 5000 steps 2 out of 3 days when working. Patient reported patient's device notifies patient if she has not moved in an hour and reported walking around the department each hour. Patient stated, I have not done anything around my house. Patient reported patient goes to bed when patient gets home from work and reported patient's work schedule is a barrier to motivation. Patient stated, I just have no motivation, I haven't gone to the grocery store in so long. Patient reported patient doesn't want to leave the house and stated, I have my own little cocoon and I like to stay there. Patient reported patient doesn't want to talk to others at times. Patient stated, somebody's count on me to do  things and reported patients counting on patient, co-workers counting on patient increase patient's motivation at work. Patient stated, a little bit angry, a little bit sad that someone could make me doubt my self worth like that in reference to previous employer. Patient stated, I cry out of frustration because I guess I'm still angry. Patient stated, I'm still in anger and I'm still in depression in reference to stages of grief. Patient stated,  the anger is still there. Patient reported patient's work environment changed when patient started questioning leadership's decisions and patient felt patient was at risk of losing her job. Patient stated, that does put it into a different light in response to generating alternatives. Patient stated,  Its fine in response to current mood.   Interventions: Cognitive Behavioral Therapy. Clinician conducted session in person at clinician's office at [facility]. Reviewed events since last session and assessed for changes. Reviewed patient's practice of behavioral activation and progress towards patient's goal to obtain 5,000 steps per day and increase physical activity. Explored differences in patient's motivation at work and at home. Discussed patient's activity level on patient's days off and explored barriers to activity. Provided psycho education related to depressive symptoms. Provided psycho education related to stages of grief as it relates to patient's previous job. Processed patient's feelings as it relates to grief in response to job loss. Provided psycho education related to generating alternatives and assisted patient in exploring alternative perspectives. Challenged statements/thoughts to assist patient in reframing thoughts. Clinician requested for homework patient complete a thought record, practice behavioral activation steps, and write letter to previous director.  Collaboration  of Care: not required at this time   Diagnosis:   Adjustment disorder with depressed mood     Plan: Patient is to utilize [employer] Therapy, thought re-framing, relaxation techniques, mindfulness, behavioral activation, and coping strategies to decrease symptoms associated with their diagnosis. Frequency: bi-weekly  Modality: individual      Long-term goal:   Reduce overall level, frequency, and intensity of the feelings of depression as evidenced by decrease in lack of appetite, difficulty falling asleep and staying asleep, lack of energy, staying in bed, fatigue, loss of interest, psychomotor retardation, difficulty concentrating when at home, and anger from 7 days/week to 1 to 2 days/week per patient report for at least 3 consecutive months. Target Date: 08/30/24  Progress: progressing    Short-term goal:  Begin healthy grieving process in regards to the loss of patient's job and friendships Target Date: 08/30/24  Progress: progressing    Increase use of self care strategies, such as, making healthy dietary choices, pedicures, music bingo, dinner with friends, reading from 1 times per week to 3 times per week Target Date: 08/30/24  Progress: progressing    Increase use of healthy coping strategies from 1 times per week to 6-7 times a day Target Date: 08/30/24  Progress: progressing    Decrease feelings of anger/irritability and sadness through use of healthy coping strategies 6-7 times per week  Target Date: 08/30/24  Progress: progressing    Identify, challenge, and replace negative thought patterns that contribute to feelings of depression and feelings associated with patient's previous employer with positive thoughts, beliefs, and positive self talk per patient's report Target Date: 08/30/24  Progress: progressing    Darice Seats, LCSW

## 2023-11-30 ENCOUNTER — Ambulatory Visit: Admitting: Clinical

## 2023-11-30 DIAGNOSIS — F4321 Adjustment disorder with depressed mood: Secondary | ICD-10-CM | POA: Diagnosis not present

## 2023-11-30 NOTE — Progress Notes (Signed)
   Darice Seats, LCSW

## 2023-11-30 NOTE — Progress Notes (Signed)
 [facility] Counselor/Therapist Progress Note  Patient ID: Jamie Mckenzie, MRN: [id number],    Date: 11/30/2023  Time Spent: 8:36am - 9:26am : 50 minutes   Treatment Type: Individual Therapy  Reported Symptoms: decreased motivation, feeling overwhelmed  Mental Status Exam: Appearance:  Neat and Well Groomed     Behavior: Appropriate  Motor: Normal  Speech/Language:  Clear and Coherent and Normal Rate  Affect: Appropriate  Mood: normal  Thought process: normal  Thought content:   WNL  Sensory/Perceptual disturbances:   WNL  Orientation: oriented to person, place, time/date, and situation  Attention: Good  Concentration: Good  Memory: WNL  Fund of knowledge:  Good  Insight:   Good  Judgment:  Good  Impulse Control: Good   Risk Assessment: Danger to Self:  No Patient denied current suicidal ideation  Self-injurious Behavior: No Danger to Others: No Patient denied current homicidal ideation Duty to Warn:no Physical Aggression / Violence:No  Access to Firearms a concern: No  Gang Involvement:No   Subjective: Patient stated, pretty good in response to events since last session. Patient stated, better in response to mood since last session. Patient reported smiling a lot more, things not bothering me as much in reference to improvement in mood. Patient stated, I did write that letter in reference to patient's homework and stated, it was hard at first. Patient reported patient worked on writing the letter each day. Patient stated, better, what I could write out I did in response letter to previous director. Patient stated, fine in reference to patient's current mood. Patient reported experiencing anxiety regarding upcoming trip and reported asking sister to come visit patient. Patient stated, leaving my house the way it is,  my house is in such a mess, my house has never been like this before, I never let my house get the way it is right now  in response to thoughts associated with feelings of anxiety. Patient stated, its been hard getting motivated to do anything at home, It just feels overwhelming right now. Patient stated, Id like to finesse it in reference to letter to previous Interior and spatial designer. Patient stated, I feel betrayed by her, a lot of betrayal, she made me feel like my nursing judgement was in question. Patient stated, I'm angry at my director for leaving me out there by myself, I felt singled out because I was the only one that had to speak with risk management.   Interventions: Cognitive Behavioral Therapy and supportive therapy. Clinician conducted session in person at clinician's office at [facility]. Reviewed events since last session and assessed for changes. Reviewed patient's homework assignment and processed patient's response to homework assignment. Explored and identified triggers for anxiety related to upcoming trip. Assisted patient in identifying behavioral activation steps to complete household tasks, such as, setting a timer, allowing 15 minutes for each task three times per week, prioritizing tasks, rewarding self after completion of tasks, completing task when arriving home from work, listening to music or audio book while completing household tasks. Provided supportive therapy, active listening, and validation as patient discussed situation/events that precipitated patient's decision to change employment. Clinician requested for homework patient complete a thought record, practice behavioral activation steps, and continue letter to previous director.   Collaboration of Care: not required at this time   Diagnosis:  Adjustment disorder with depressed mood     Plan: Patient is to utilize [employer], thought re-framing, relaxation techniques, mindfulness, behavioral activation, and coping strategies to decrease symptoms associated with  their diagnosis. Frequency: bi-weekly  Modality:  individual      Long-term goal:   Reduce overall level, frequency, and intensity of the feelings of depression as evidenced by decrease in lack of appetite, difficulty falling asleep and staying asleep, lack of energy, staying in bed, fatigue, loss of interest, psychomotor retardation, difficulty concentrating when at home, and anger from 7 days/week to 1 to 2 days/week per patient report for at least 3 consecutive months. Target Date: 08/30/24  Progress: progressing    Short-term goal:  Begin healthy grieving process in regards to the loss of patient's job and friendships Target Date: 08/30/24  Progress: progressing    Increase use of self care strategies, such as, making healthy dietary choices, pedicures, music bingo, dinner with friends, reading from 1 times per week to 3 times per week Target Date: 08/30/24  Progress: progressing    Increase use of healthy coping strategies from 1 times per week to 6-7 times a day Target Date: 08/30/24  Progress: progressing    Decrease feelings of anger/irritability and sadness through use of healthy coping strategies 6-7 times per week  Target Date: 08/30/24  Progress: progressing    Identify, challenge, and replace negative thought patterns that contribute to feelings of depression and feelings associated with patient's previous employer with positive thoughts, beliefs, and positive self talk per patient's report Target Date: 08/30/24  Progress: progressing    Darice Seats, LCSW

## 2023-12-21 ENCOUNTER — Ambulatory Visit: Admitting: Clinical

## 2023-12-21 DIAGNOSIS — F4321 Adjustment disorder with depressed mood: Secondary | ICD-10-CM | POA: Diagnosis not present

## 2023-12-21 NOTE — Progress Notes (Signed)
   Jamie Seats, LCSW

## 2023-12-21 NOTE — Progress Notes (Signed)
 [facility] Counselor/Therapist Progress Note  Patient ID: Jamie Mckenzie, MRN: [id number],    Date: 12/21/2023  Time Spent: 8:35am - 9:20am : 45 minutes   Treatment Type: Individual Therapy  Reported Symptoms: decreased motivation, fatigue  Mental Status Exam: Appearance:  Neat and Well Groomed     Behavior: Patient reported feeling tired  Motor: Normal  Speech/Language:  Clear and Coherent and Normal Rate  Affect: Appropriate  Mood: normal  Thought process: normal  Thought content:   WNL  Sensory/Perceptual disturbances:   WNL  Orientation: oriented to person, place, time/date, and situation  Attention: Good  Concentration: Good  Memory: WNL  Fund of knowledge:  Good  Insight:   Good  Judgment:  Good  Impulse Control: Good   Risk Assessment: Danger to Self:  No Patient denied current suicidal ideation  Self-injurious Behavior: No Danger to Others: No Patient denied current homicidal ideation Duty to Warn:no Physical Aggression / Violence:No  Access to Firearms a concern: No  Gang Involvement:No   Subjective: Patient stated, its been pretty good in response to events since last session.Patient reported sister recently visited patient and patient stated, we had a good time. Patient reported she (sister) got me out of the house several days during sister's visit. Patient stated, we (patient/sister) were out of the house doing something or in the house doing something most of the time. Patient stated, I do a little bit here and a little bit there in reference to motivation and completion of tasks. Patient stated, it's half there in reference to motivation. Patient reported patient's sister encouraged patient to take one little thing at a time. Patient reported patient has been practicing behavioral activation strategies, such as, completing task when patient arrives home from work, setting a timer when completing tasks, and stated, taking  something piece by piece. Patient reported completing tasks after arriving home quarter to half of the time. Patient stated, I did do a timer and stated, I found that 15 was up in no time. Patient reported patient recently went shopping with friend and has continued to maintain increased movement during work hours. Patient stated, better, more up, than just kind of flat in response to mood since last session. Patient stated, its fine today in reference to current mood. Patient reported patient read through the letter to patient's previous director and decided to write another letter to patient's director. Patient reported patient tried to avoid you statements in letter. Patient stated, every time I do it (write letter) I feel not as angry. Patient reported patient recently spoke with a previous colleague who validated patient and patient stated, it felt good that its not me, it isn't me.  Patient reported the following positives related to patient's current place of employment: volume is less, acuity is not as high, works with two close friends, nice to work with colleagues and nice to see them, positive work environment, supportive environment.   Interventions: Cognitive Behavioral Therapy. Clinician conducted session in person at clinician's office at [facility]. Reviewed events since last session and assessed for changes. Dicussed sister's visit and the impact on patient's motivation and activity level. Reviewed behavioral activation strategies, patient's implementation/practice of strategies, and the outcome. Discussed recent increase in patient's activity level and patient's participation in positive activities. Reviewed the status of letter to director and content of letter. Assisted patient in exploring and identifying positive aspects of current employment. Clinician requested for homework patient continue thought record, practice behavioral activation steps, and  continue  letter to previous interior and spatial designer.   Collaboration of Care: not required at this time   Diagnosis:  Adjustment disorder with depressed mood     Plan: Patient is to utilize [employer], thought re-framing, relaxation techniques, mindfulness, behavioral activation, and coping strategies to decrease symptoms associated with their diagnosis. Frequency: bi-weekly  Modality: individual      Long-term goal:   Reduce overall level, frequency, and intensity of the feelings of depression as evidenced by decrease in lack of appetite, difficulty falling asleep and staying asleep, lack of energy, staying in bed, fatigue, loss of interest, psychomotor retardation, difficulty concentrating when at home, and anger from 7 days/week to 1 to 2 days/week per patient report for at least 3 consecutive months. Target Date: 08/30/24  Progress: progressing    Short-term goal:  Begin healthy grieving process in regards to the loss of patient's job and friendships Target Date: 08/30/24  Progress: progressing    Increase use of self care strategies, such as, making healthy dietary choices, pedicures, music bingo, dinner with friends, reading from 1 times per week to 3 times per week Target Date: 08/30/24  Progress: progressing    Increase use of healthy coping strategies from 1 times per week to 6-7 times a day Target Date: 08/30/24  Progress: progressing    Decrease feelings of anger/irritability and sadness through use of healthy coping strategies 6-7 times per week  Target Date: 08/30/24  Progress: progressing    Identify, challenge, and replace negative thought patterns that contribute to feelings of depression and feelings associated with patient's previous employer with positive thoughts, beliefs, and positive self talk per patient's report Target Date: 08/30/24  Progress: progressing     Darice Seats, LCSW

## 2024-01-12 ENCOUNTER — Ambulatory Visit: Admitting: Clinical

## 2024-01-12 DIAGNOSIS — F4321 Adjustment disorder with depressed mood: Secondary | ICD-10-CM | POA: Diagnosis not present

## 2024-01-12 NOTE — Progress Notes (Signed)
 [facility] Counselor/Therapist Progress Note  Patient ID: Jamie Mckenzie, MRN: [id number],    Date: 01/12/2024  Time Spent: 8:36am - 9:24am : 48 minutes   Treatment Type: Individual Therapy  Reported Symptoms: fatigue  Mental Status Exam: Appearance:  Neat and Well Groomed     Behavior: Drowsy  Motor: Normal  Speech/Language:  Clear and Coherent and Normal Rate  Affect: Appropriate  Mood: normal  Thought process: normal  Thought content:   WNL  Sensory/Perceptual disturbances:   WNL  Orientation: oriented to person, place, time/date, and situation  Attention: Good  Concentration: Good  Memory: WNL  Fund of knowledge:  Good  Insight:   Good  Judgment:  Good  Impulse Control: Good   Risk Assessment: Danger to Self:  No Patient denied current suicidal ideation  Self-injurious Behavior: No Danger to Others: No Patient denied current homicidal ideation Duty to Warn:no Physical Aggression / Violence:No  Access to Firearms a concern: No  Gang Involvement:No   Subjective:  Patient stated, pretty good actually in response to events since last session. Patient stated, I've got a little more get up and go, started doing some tai chi walking, and attended a friends giving. Patient reported patient enjoyed the friends giving and was able to visit with friends patient has not seen in several years. Patient stated,  It was just nice to see them all in reference to recent friends giving. Patient reported patient had an evaluation at work and stated, that was good.  Patient reported patient has completed her laundry and cleaned patient's kitchen, went grocery shopping, and has been listening to audio books. Patient stated, I have quite enjoyed those books. Patient stated, I think its better, smiling more, laughing more in response to mood since last session. Patient stated, I don't think about it as much now in reference to previous employment. Patient  reported patient forgot the letter to previous employer. Patient stated, that probably has helped writing that letter and stated, I truly don't think about it as much as I did in reference to previous employment. 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 Patient stated, I don't know what is next for me in reference to professional goals. Patient stated, Its good in response to current mood.    Interventions: Cognitive Behavioral Therapy. Clinician conducted session in person at clinician's office at [facility]. Reviewed events since last session and assessed for changes. Reviewed positive events that have occurred since last session and positive activities patient has participated in since last session. Reviewed patient's recent activity level and completion of tasks. Discussed patient's homework and explored the impact on patient/patient's response to homework. Explored patient's feelings as it relates to self worth. Clinician requested for homework patient continue thought record, practice behavioral activation steps, and continue letter to previous director.   Collaboration of Care: not required at this time   Diagnosis:  Adjustment disorder with depressed mood     Plan: Patient is to utilize [employer], thought re-framing, relaxation techniques, mindfulness, behavioral activation, and coping strategies to decrease symptoms associated with their diagnosis. Frequency: bi-weekly  Modality: individual      Long-term goal:   Reduce overall level, frequency, and intensity of the feelings of depression as evidenced by decrease in lack of appetite, difficulty falling asleep and staying asleep, lack of energy, staying in bed, fatigue, loss of interest, psychomotor retardation, difficulty concentrating  when at home, and anger from 7 days/week to 1 to 2 days/week per patient report for at  least 3 consecutive months. Target Date: 08/30/24  Progress: progressing    Short-term goal:  Begin healthy grieving process in regards to the loss of patient's job and friendships Target Date: 08/30/24  Progress: progressing    Increase use of self care strategies, such as, making healthy dietary choices, pedicures, music bingo, dinner with friends, reading from 1 times per week to 3 times per week Target Date: 08/30/24  Progress: progressing    Increase use of healthy coping strategies from 1 times per week to 6-7 times a day Target Date: 08/30/24  Progress: progressing    Decrease feelings of anger/irritability and sadness through use of healthy coping strategies 6-7 times per week  Target Date: 08/30/24  Progress: progressing    Identify, challenge, and replace negative thought patterns that contribute to feelings of depression and feelings associated with patient's previous employer with positive thoughts, beliefs, and positive self talk per patient's report Target Date: 08/30/24  Progress: progressing       Darice Seats, LCSW

## 2024-01-12 NOTE — Progress Notes (Signed)
   Darice Seats, LCSW

## 2024-01-18 ENCOUNTER — Ambulatory Visit
Admission: RE | Admit: 2024-01-18 | Discharge: 2024-01-18 | Disposition: A | Source: Ambulatory Visit | Attending: Family Medicine | Admitting: Family Medicine

## 2024-01-18 DIAGNOSIS — N6002 Solitary cyst of left breast: Secondary | ICD-10-CM

## 2024-01-19 ENCOUNTER — Ambulatory Visit: Payer: Self-pay | Admitting: Family Medicine

## 2024-01-27 ENCOUNTER — Ambulatory Visit (INDEPENDENT_AMBULATORY_CARE_PROVIDER_SITE_OTHER): Admitting: Clinical

## 2024-01-27 DIAGNOSIS — F4321 Adjustment disorder with depressed mood: Secondary | ICD-10-CM

## 2024-01-27 NOTE — Progress Notes (Signed)
 [facility] Counselor/Therapist Progress Note  Patient ID: Jamie Mckenzie, MRN: [id number],    Date: 01/27/2024  Time Spent: 8:36am - 9:25am : 49 minutes  Treatment Type: Individual Therapy  Reported Symptoms: difficulty staying asleep and returning to sleep  Mental Status Exam: Appearance:  Neat and Well Groomed     Behavior: Appropriate  Motor: Normal  Speech/Language:  Clear and Coherent and Normal Rate  Affect: Appropriate  Mood: normal  Thought process: normal  Thought content:   WNL  Sensory/Perceptual disturbances:   WNL  Orientation: oriented to person, place, time/date, and situation  Attention: Good  Concentration: Good  Memory: WNL  Fund of knowledge:  Good  Insight:   Good  Judgment:  Good  Impulse Control: Good   Risk Assessment: Danger to Self:  No Patient denied current suicidal ideation  Self-injurious Behavior: No Danger to Others: No Patient denied current homicidal ideation Duty to Warn:no Physical Aggression / Violence:No  Access to Firearms a concern: No  Gang Involvement:No   Subjective:  Patient stated, pretty good in response to events since last session. Patient stated, I've actually been feeling pretty good in response to mood since last session. Patient stated, Im trying in response to patient's activity level and completing tasks. Patient stated, I'm trying to do dishes before they are getting backed up in the sink. Patient reported difficulty staying asleep and returning to sleep. Patient reported drinking a large coffee at the beginning of patient's shift each work day and finishing patient's coffee by 12 am. Patient reported experiencing worry related to getting sleep when waking up at night. Patient reported patient completed letter to previous interior and spatial designer. Patient stated, I don't really think about it anymore in reference to situation regarding previous employer. Patient stated, I don't think about it a lot anymore  in reference to situation regarding previous employer and stated, It may pop into my head and pops out. Patient reported not as much anger in reference to situation related to previous employer. 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   Interventions: Cognitive Behavioral Therapy and Gestalt/Psychodrama. Clinician conducted session in person at clinician's office at [facility]. Reviewed events since last session and assessed for changes. Discussed patient's activity level and status of completion of tasks. Discussed decrease in sleep. Explored patient's sleep routine/hygiene. Provided psycho education related to healthy sleep hygiene. Discussed patient following up PCP regarding sleep. Reviewed patient's homework to write letter to previous director. Utilized empty chair technique and provided therapeutic space for patient to vocalize patient's thoughts/feelings to patient's previous interior and spatial designer. Homework assignment - patient continue thought record and practice behavioral activation steps,   Collaboration of Care: not required at this time   Diagnosis:  Adjustment disorder with depressed mood     Plan: Patient is to utilize [employer], thought re-framing, relaxation techniques, mindfulness, behavioral activation, and coping strategies to decrease symptoms associated with their diagnosis. Frequency: bi-weekly  Modality: individual      Long-term goal:   Reduce overall level, frequency, and intensity of the feelings of depression as evidenced by decrease in lack of appetite, difficulty falling asleep and staying asleep, lack of energy, staying in bed, fatigue, loss of interest, psychomotor retardation, difficulty concentrating when at home, and anger from 7 days/week to 1 to 2 days/week per patient report for at least 3 consecutive months. Target Date:  08/30/24  Progress: progressing    Short-term goal:  Begin healthy grieving process in regards to the loss of patient's job and friendships Target Date: 08/30/24  Progress: progressing    Increase use of self care strategies, such as, making healthy dietary choices, pedicures, music bingo, dinner with friends, reading from 1 times per week to 3 times per week Target Date: 08/30/24  Progress: progressing    Increase use of healthy coping strategies from 1 times per week to 6-7 times a day Target Date: 08/30/24  Progress: progressing    Decrease feelings of anger/irritability and sadness through use of healthy coping strategies 6-7 times per week  Target Date: 08/30/24  Progress: progressing    Identify, challenge, and replace negative thought patterns that contribute to feelings of depression and feelings associated with patient's previous employer with positive thoughts, beliefs, and positive self talk per patient's report Target Date: 08/30/24  Progress: progressing       Darice Seats, LCSW

## 2024-01-27 NOTE — Progress Notes (Signed)
   Darice Seats, LCSW

## 2024-02-29 ENCOUNTER — Ambulatory Visit: Payer: Self-pay | Admitting: Clinical

## 2024-02-29 DIAGNOSIS — F4321 Adjustment disorder with depressed mood: Secondary | ICD-10-CM | POA: Diagnosis not present

## 2024-02-29 NOTE — Progress Notes (Signed)
 "  [facility] Counselor/Therapist Progress Note  Patient ID: Jamie Mckenzie, MRN: [id number],    Date: 02/29/2024  Time Spent: 8:35am - 9:20am : 45 minutes   Treatment Type: Individual Therapy  Reported Symptoms: decreased sleep, fatigue  Mental Status Exam: Appearance:  Neat and Well Groomed     Behavior: Appropriate  Motor: Normal  Speech/Language:  Clear and Coherent and Normal Rate  Affect: Appropriate  Mood: normal  Thought process: normal  Thought content:   WNL  Sensory/Perceptual disturbances:   WNL  Orientation: oriented to person, place, time/date, and situation  Attention: Good  Concentration: Good  Memory: WNL  Fund of knowledge:  Good  Insight:   Good  Judgment:  Good  Impulse Control: Good   Risk Assessment: Danger to Self:  No Patient denied current suicidal ideation  Self-injurious Behavior: No Danger to Others: No Patient denied current homicidal ideation Duty to Warn:no Physical Aggression / Violence:No  Access to Firearms a concern: No  Gang Involvement:No   Subjective:  Patient stated, pretty good in response to events since last session. Patient reported patient traveled to [place]  and spent time with family in response to events since last session. Patient stated, mood is same, I'm feeling pretty good. Patient stated, movement wise, I'm trying to move a little bit more. Patient reported patient noted feeling fatigued upon returning from [place] . Patient reported making myself move. Patient reported patient purchased a book with a list of household chores to complete with the goal of completing one task each day and patient reported plans to start using chore list today.  Patient reported an increase in physical activity and stated, I'm not as out of breath. Patient reported patient continues to walk around the unit each hour at work to increase physical activity. Patient stated, that's still being a challenge in response to  sleep. Patient reported upcoming trip to [place] and reported experiencing anxiety when flying. Patient stated, I'm not in control in reference to thoughts associated with flying and reported a fear of crashing. Patient stated, I know its an irrational fear.   Interventions: Cognitive Behavioral Therapy. Clinician conducted session in person at clinician's office at [facility]. Reviewed events since last session and assessed for changes. Discussed status of physical activity and strategies patient is implementing to increase activity level. Explored fear of flying and identified thoughts triggered by flying. Provided psycho education related to cognitive restructuring/challenging negative thought patterns, deep breathing exercise (4-4-6 method), and imagery.    Collaboration of Care: not required at this time   Diagnosis:  Adjustment disorder with depressed mood     Plan: Patient is to utilize [employer], thought re-framing, relaxation techniques, mindfulness, behavioral activation, and coping strategies to decrease symptoms associated with their diagnosis. Frequency: bi-weekly  Modality: individual      Long-term goal:   Reduce overall level, frequency, and intensity of the feelings of depression as evidenced by decrease in lack of appetite, difficulty falling asleep and staying asleep, lack of energy, staying in bed, fatigue, loss of interest, psychomotor retardation, difficulty concentrating when at home, and anger from 7 days/week to 1 to 2 days/week per patient report for at least 3 consecutive months. Target Date: 08/30/24  Progress: progressing    Short-term goal:  Begin healthy grieving process in regards to the loss of patient's job and friendships Target Date: 08/30/24  Progress: progressing    Increase use of self care strategies, such as, making healthy dietary choices, pedicures, music  bingo, dinner with friends, reading from 1 times per week to 3  times per week Target Date: 08/30/24  Progress: progressing    Increase use of healthy coping strategies from 1 times per week to 6-7 times a day Target Date: 08/30/24  Progress: progressing    Decrease feelings of anger/irritability and sadness through use of healthy coping strategies 6-7 times per week  Target Date: 08/30/24  Progress: progressing    Identify, challenge, and replace negative thought patterns that contribute to feelings of depression and feelings associated with patient's previous employer with positive thoughts, beliefs, and positive self talk per patient's report Target Date: 08/30/24  Progress: progressing         Darice Seats, LCSW    "

## 2024-02-29 NOTE — Progress Notes (Signed)
   Jamie Seats, LCSW

## 2024-03-07 ENCOUNTER — Encounter: Payer: Self-pay | Admitting: Family Medicine

## 2024-03-07 MED ORDER — LORAZEPAM 0.5 MG PO TABS: 0.5000 mg | ORAL_TABLET | Freq: Every day | ORAL | 0 refills | Status: AC | PRN

## 2024-03-23 ENCOUNTER — Ambulatory Visit: Payer: Self-pay | Admitting: Clinical

## 2024-03-23 DIAGNOSIS — F4321 Adjustment disorder with depressed mood: Secondary | ICD-10-CM | POA: Diagnosis not present

## 2024-03-23 NOTE — Progress Notes (Signed)
   Darice Seats, LCSW

## 2024-03-23 NOTE — Progress Notes (Signed)
 "  [facility] Counselor/Therapist Progress Note  Patient ID: Jamie Mckenzie, MRN: [id number],    Date: 03/23/2024  Time Spent: 8:38am - 9:21am : 43 minutes   Treatment Type: Individual Therapy  Reported Symptoms: Patient reported experiencing fatigue during recent trip  Mental Status Exam: Appearance:  Neat and Well Groomed     Behavior: Appropriate  Motor: Normal  Speech/Language:  Clear and Coherent and Normal Rate  Affect: Appropriate  Mood: normal  Thought process: normal  Thought content:   WNL  Sensory/Perceptual disturbances:   WNL  Orientation: oriented to person, place, time/date, and situation  Attention: Good  Concentration: Good  Memory: WNL  Fund of knowledge:  Good  Insight:   Good  Judgment:  Good  Impulse Control: Good   Risk Assessment: Danger to Self:  No Patient denied current suicidal ideation  Self-injurious Behavior: No Danger to Others: No Patient denied current homicidal ideation Duty to Warn:no Physical Aggression / Violence:No  Access to Firearms a concern: No  Gang Involvement:No   Subjective:  Patient reported patient recently returned from trip to [place] and patient stated, wonderful, wonderful in response to recent trip. Patient stated, It's been fine in response to mood and stated, better than it has been in reference to mood. Patient stated, my sister and reported recent trip as contributing factors to improvement in mood. Patient reported patient and sister have gotten closer since their mother passed away. Patient reported patient's sister accompanied patient on recent trip. Patient stated, its about the same in response to energy level and motivation. Patient reported patient cooked for sister during sister's visit. Patient stated, I felt very fatigued while I was out of town, I could barely walk. Patient reported fatigue has improved since returning from trip. Patient reported patient closed her eyes and  listened to an audio book during flights.    Interventions: Cognitive Behavioral Therapy. Clinician conducted session in person at clinician's office at [facility]. Reviewed events since last session and assessed for changes.  Explored and identified contributing factors to recent improvement in mood. Assessed energy level and physical activity. Discussed recent trip and symptoms during trip. Reviewed coping strategies discussed during previous session, patient's implementation of strategies, and the outcome during recent flights.    Collaboration of Care: not required at this time   Diagnosis:  Adjustment disorder with depressed mood     Plan: Patient is to utilize [employer], thought re-framing, relaxation techniques, mindfulness, behavioral activation, and coping strategies to decrease symptoms associated with their diagnosis. Frequency: bi-weekly  Modality: individual      Long-term goal:   Reduce overall level, frequency, and intensity of the feelings of depression as evidenced by decrease in lack of appetite, difficulty falling asleep and staying asleep, lack of energy, staying in bed, fatigue, loss of interest, psychomotor retardation, difficulty concentrating when at home, and anger from 7 days/week to 1 to 2 days/week per patient report for at least 3 consecutive months. Target Date: 08/30/24  Progress: progressing    Short-term goal:  Begin healthy grieving process in regards to the loss of patient's job and friendships Target Date: 08/30/24  Progress: progressing    Increase use of self care strategies, such as, making healthy dietary choices, pedicures, music bingo, dinner with friends, reading from 1 times per week to 3 times per week Target Date: 08/30/24  Progress: progressing    Increase use of healthy coping strategies from 1 times per week to 6-7 times a  day Target Date: 08/30/24  Progress: progressing    Decrease feelings of anger/irritability  and sadness through use of healthy coping strategies 6-7 times per week  Target Date: 08/30/24  Progress: progressing    Identify, challenge, and replace negative thought patterns that contribute to feelings of depression and feelings associated with patient's previous employer with positive thoughts, beliefs, and positive self talk per patient's report Target Date: 08/30/24  Progress: progressing           Darice Seats, LCSW    "

## 2024-04-13 ENCOUNTER — Ambulatory Visit: Admitting: Clinical

## 2024-05-03 ENCOUNTER — Ambulatory Visit: Admitting: Clinical
